# Patient Record
Sex: Female | Born: 1975 | Race: White | Hispanic: No | Smoking: Current every day smoker
Health system: Southern US, Academic
[De-identification: ages and names within clinical notes are randomized; demographics above are authoritative.]

## PROBLEM LIST (undated history)

## (undated) DIAGNOSIS — N289 Disorder of kidney and ureter, unspecified: Secondary | ICD-10-CM

## (undated) DIAGNOSIS — I2510 Atherosclerotic heart disease of native coronary artery without angina pectoris: Secondary | ICD-10-CM

## (undated) DIAGNOSIS — M5410 Radiculopathy, site unspecified: Secondary | ICD-10-CM

## (undated) DIAGNOSIS — F17200 Nicotine dependence, unspecified, uncomplicated: Secondary | ICD-10-CM

## (undated) DIAGNOSIS — R0602 Shortness of breath: Secondary | ICD-10-CM

## (undated) DIAGNOSIS — I499 Cardiac arrhythmia, unspecified: Secondary | ICD-10-CM

## (undated) DIAGNOSIS — F32A Depression, unspecified: Secondary | ICD-10-CM

## (undated) DIAGNOSIS — I8390 Asymptomatic varicose veins of unspecified lower extremity: Secondary | ICD-10-CM

## (undated) DIAGNOSIS — M797 Fibromyalgia: Secondary | ICD-10-CM

## (undated) DIAGNOSIS — F329 Major depressive disorder, single episode, unspecified: Secondary | ICD-10-CM

## (undated) DIAGNOSIS — I639 Cerebral infarction, unspecified: Secondary | ICD-10-CM

## (undated) DIAGNOSIS — R42 Dizziness and giddiness: Secondary | ICD-10-CM

## (undated) DIAGNOSIS — G9050 Complex regional pain syndrome I, unspecified: Secondary | ICD-10-CM

## (undated) DIAGNOSIS — M129 Arthropathy, unspecified: Secondary | ICD-10-CM

## (undated) DIAGNOSIS — L309 Dermatitis, unspecified: Secondary | ICD-10-CM

## (undated) DIAGNOSIS — C801 Malignant (primary) neoplasm, unspecified: Secondary | ICD-10-CM

## (undated) DIAGNOSIS — E079 Disorder of thyroid, unspecified: Secondary | ICD-10-CM

## (undated) DIAGNOSIS — N879 Dysplasia of cervix uteri, unspecified: Secondary | ICD-10-CM

## (undated) DIAGNOSIS — Z8541 Personal history of malignant neoplasm of cervix uteri: Secondary | ICD-10-CM

## (undated) DIAGNOSIS — E785 Hyperlipidemia, unspecified: Secondary | ICD-10-CM

## (undated) DIAGNOSIS — K219 Gastro-esophageal reflux disease without esophagitis: Secondary | ICD-10-CM

## (undated) DIAGNOSIS — F419 Anxiety disorder, unspecified: Secondary | ICD-10-CM

## (undated) DIAGNOSIS — G459 Transient cerebral ischemic attack, unspecified: Secondary | ICD-10-CM

## (undated) DIAGNOSIS — N760 Acute vaginitis: Secondary | ICD-10-CM

## (undated) DIAGNOSIS — J45909 Unspecified asthma, uncomplicated: Secondary | ICD-10-CM

## (undated) DIAGNOSIS — Z87898 Personal history of other specified conditions: Secondary | ICD-10-CM

## (undated) DIAGNOSIS — R221 Localized swelling, mass and lump, neck: Secondary | ICD-10-CM

## (undated) DIAGNOSIS — I10 Essential (primary) hypertension: Secondary | ICD-10-CM

## (undated) DIAGNOSIS — O009 Ectopic pregnancy, unspecified: Secondary | ICD-10-CM

## (undated) DIAGNOSIS — I4891 Unspecified atrial fibrillation: Secondary | ICD-10-CM

## (undated) DIAGNOSIS — J449 Chronic obstructive pulmonary disease, unspecified: Secondary | ICD-10-CM

## (undated) DIAGNOSIS — E282 Polycystic ovarian syndrome: Secondary | ICD-10-CM

## (undated) DIAGNOSIS — F99 Mental disorder, not otherwise specified: Secondary | ICD-10-CM

## (undated) DIAGNOSIS — G959 Disease of spinal cord, unspecified: Secondary | ICD-10-CM

## (undated) DIAGNOSIS — K760 Fatty (change of) liver, not elsewhere classified: Secondary | ICD-10-CM

## (undated) DIAGNOSIS — E041 Nontoxic single thyroid nodule: Secondary | ICD-10-CM

## (undated) DIAGNOSIS — D332 Benign neoplasm of brain, unspecified: Secondary | ICD-10-CM

## (undated) HISTORY — DX: Fatty (change of) liver, not elsewhere classified: K76.0

## (undated) HISTORY — DX: Mental disorder, not otherwise specified: F99

## (undated) HISTORY — DX: Anxiety disorder, unspecified: F41.9

## (undated) HISTORY — DX: Arthropathy, unspecified: M12.9

## (undated) HISTORY — DX: Nontoxic single thyroid nodule: E04.1

## (undated) HISTORY — PX: HX HYSTERECTOMY: SHX81

## (undated) HISTORY — DX: Depression, unspecified: F32.A

## (undated) HISTORY — DX: Asymptomatic varicose veins of unspecified lower extremity: I83.90

## (undated) HISTORY — PX: HX ENDOMETRIAL ABLATION: 2100001129

## (undated) HISTORY — DX: Essential (primary) hypertension: I10

## (undated) HISTORY — PX: HX TONSILLECTOMY: SHX27

## (undated) HISTORY — DX: Acute vaginitis: N76.0

## (undated) HISTORY — DX: Unspecified asthma, uncomplicated: J45.909

## (undated) HISTORY — DX: Fibromyalgia: M79.7

## (undated) HISTORY — DX: Localized swelling, mass and lump, neck: R22.1

## (undated) HISTORY — DX: Cerebral infarction, unspecified: I63.9

## (undated) HISTORY — PX: HX SHOULDER SURGERY: 2100001311

## (undated) HISTORY — DX: Disorder of kidney and ureter, unspecified: N28.9

## (undated) HISTORY — DX: Dysplasia of cervix uteri, unspecified: N87.9

## (undated) HISTORY — DX: Gastro-esophageal reflux disease without esophagitis: K21.9

## (undated) HISTORY — DX: Dizziness and giddiness: R42

## (undated) HISTORY — DX: Unspecified atrial fibrillation: I48.91

## (undated) HISTORY — PX: HX DILATION AND CURETTAGE: SHX78

## (undated) HISTORY — DX: Polycystic ovarian syndrome: E28.2

## (undated) HISTORY — PX: HX TUBAL LIGATION: SHX77

## (undated) HISTORY — DX: Atherosclerotic heart disease of native coronary artery without angina pectoris: I25.10

## (undated) HISTORY — PX: HX HEART CATHETERIZATION: SHX148

## (undated) HISTORY — PX: HX APPENDECTOMY: SHX54

## (undated) NOTE — Progress Notes (Signed)
 Formatting of this note is different from the original.  Subjective   Patient ID: Caitlyn Gomez is a 26 y.o. female presenting to the Urgent Care with a chief complaint of Nasal Congestion (Runny/stuffy nose, sore throat, headache, fatigue X 1 day).    PT C/O SINUS PRESSURE AND CONGESTION.  HAS CHRONIC SINUS ISSUES AND USUALLY REQUIRES AN ABX.    History provided by:  Patient    Objective   BP 110/77 (BP Location: Left arm, Patient Position: Sitting, BP Cuff Size: Large adult)   Pulse 86   Temp 36.4 C (97.5 F) (Temporal)   Resp 20   Ht 1.676 m (5' 6)   Wt (!) 141 kg (310 lb)   SpO2 97%   BMI 50.04 kg/m     Physical Exam  Vitals and nursing note reviewed.   Constitutional:       Appearance: Normal appearance.   HENT:      Right Ear: Tympanic membrane normal.      Left Ear: Tympanic membrane normal.      Nose: Congestion and rhinorrhea present.      Right Sinus: Maxillary sinus tenderness present.      Left Sinus: Maxillary sinus tenderness present.      Mouth/Throat:      Mouth: Mucous membranes are moist.      Pharynx: Posterior oropharyngeal erythema, uvula swelling and postnasal drip present.   Eyes:      Extraocular Movements: Extraocular movements intact.      Pupils: Pupils are equal, round, and reactive to light.   Cardiovascular:      Rate and Rhythm: Normal rate and regular rhythm.      Pulses: Normal pulses.      Heart sounds: Normal heart sounds.   Pulmonary:      Effort: Pulmonary effort is normal.   Skin:     General: Skin is warm and dry.      Capillary Refill: Capillary refill takes less than 2 seconds.   Neurological:      General: No focal deficit present.      Mental Status: She is alert and oriented to person, place, and time.   Psychiatric:         Mood and Affect: Mood normal.         Behavior: Behavior normal.         Assessment & Plan    Assessment & Plan  Cough, unspecified type    Orders:    POCT Covid Antigen    POCT Strep    POCT Influenza    Acute sinusitis, recurrence not  specified, unspecified location            In-House Lab Results:     Results for orders placed or performed in visit on 04/15/24   POCT Strep    Collection Time: 04/15/24  1:32 PM   Result Value Ref Range    Rapid Strep A Screen Negative Negative       In-House Imaging Reads:        Procedure Documentation:  Procedures     ED Course & MDM   MDM - Medical Decision Making: Reviewed previous Chart  Electronically signed by Lauraine Kirsch, CRNP at 04/15/2024  2:21 PM EDT

---
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
## 1997-09-07 ENCOUNTER — Ambulatory Visit (HOSPITAL_COMMUNITY): Payer: Self-pay

## 1998-03-19 ENCOUNTER — Ambulatory Visit (INDEPENDENT_AMBULATORY_CARE_PROVIDER_SITE_OTHER): Payer: Self-pay

## 2001-04-26 ENCOUNTER — Emergency Department (HOSPITAL_COMMUNITY): Payer: Self-pay | Admitting: Emergency Medicine

## 2002-09-14 ENCOUNTER — Ambulatory Visit (HOSPITAL_BASED_OUTPATIENT_CLINIC_OR_DEPARTMENT_OTHER): Payer: Self-pay

## 2004-11-10 ENCOUNTER — Ambulatory Visit (INDEPENDENT_AMBULATORY_CARE_PROVIDER_SITE_OTHER): Payer: Self-pay

## 2005-06-29 ENCOUNTER — Ambulatory Visit (INDEPENDENT_AMBULATORY_CARE_PROVIDER_SITE_OTHER): Payer: Self-pay

## 2009-06-27 ENCOUNTER — Encounter (INDEPENDENT_AMBULATORY_CARE_PROVIDER_SITE_OTHER): Payer: Medicaid Other | Admitting: Gastroenterology

## 2010-01-24 ENCOUNTER — Emergency Department (EMERGENCY_DEPARTMENT_HOSPITAL): Payer: Medicaid Other

## 2010-01-24 ENCOUNTER — Emergency Department
Admission: EM | Admit: 2010-01-24 | Discharge: 2010-01-24 | Disposition: A | Discharge status: Home / Self Care | Payer: Medicaid Other | Attending: Emergency Medicine | Admitting: Emergency Medicine

## 2010-01-24 ENCOUNTER — Encounter (HOSPITAL_COMMUNITY): Payer: Self-pay

## 2010-01-24 DIAGNOSIS — N898 Other specified noninflammatory disorders of vagina: Secondary | ICD-10-CM | POA: Insufficient documentation

## 2010-01-24 LAB — TYPE AND SCREEN
ABO/RH(D): A POS
ANTIBODY SCREEN: NEGATIVE

## 2010-01-24 LAB — NEISSERIA GONORRHOEAE DNA BY PCR

## 2010-01-24 LAB — BASIC METABOLIC PANEL
ANION GAP: 7 mmol/L (ref 5–16)
BUN/CREAT RATIO: 14 (ref 6–22)
BUN: 10 mg/dL (ref 6–20)
CALCIUM: 8.6 mg/dL (ref 8.5–10.4)
CARBON DIOXIDE: 27 mmol/L (ref 22–32)
CHLORIDE: 107 mmol/L (ref 96–111)
CREATININE: 0.69 mg/dL (ref 0.49–1.10)
ESTIMATED GLOMERULAR FILTRATION RATE: >59 ml/min/1.73m2 (ref >59)
GLUCOSE,NONFAST: 88 mg/dL (ref 65–139)
POTASSIUM: 3.3 mmol/L — ABNORMAL LOW (ref 3.5–5.1)
SODIUM: 141 mmol/L (ref 136–145)

## 2010-01-24 LAB — CBC/DIFF
BASOPHILS: 0 % (ref 0–1)
BASOS ABS: 0.051 THOU/uL (ref 0.0–0.2)
EOS ABS: 0.135 THOU/uL (ref 0.1–0.3)
EOSINOPHIL: 1 % (ref 1–6)
HCT: 36.7 % (ref 33.5–45.2)
HGB: 12.2 g/dL (ref 11.5–15.2)
LYMPHOCYTES: 30 % (ref 20–45)
LYMPHS ABS: 3 THOU/uL (ref 1.0–4.8)
MCH: 28.6 pg (ref 27.4–33.0)
MCHC: 33.4 g/dL (ref 31.6–35.5)
MCV: 85.7 fL (ref 82.0–99.0)
MONOCYTES: 8 % (ref 4–13)
MONOS ABS: 0.817 THOU/uL (ref 0.1–0.9)
MPV: 8 FL (ref 7.4–10.4)
NRBC'S: 0 /100{WBCs}
PLATELET COUNT: 252 THOU/uL (ref 140–450)
PMN ABS: 6.17 THOU/uL (ref 1.5–7.7)
PMN'S: 61 % (ref 40–75)
RBC: 4.28 MIL/uL (ref 3.84–5.04)
RDW: 12.5 % (ref 10.2–14.0)
WBC: 10.2 THOU/uL (ref 3.5–11.0)

## 2010-01-24 LAB — WETMOUNT: WETMOUNT: NONE SEEN

## 2010-01-24 LAB — HCG, SERUM QUALITATIVE, PREGNANCY: PREGNANCY, SERUM QUALITATIVE: NEGATIVE

## 2010-01-24 LAB — CHLAMYDIA TRACHOMITIS DNA BY PCR (INHOUSE)

## 2010-01-24 MED ORDER — MORPHINE 4 MG/ML INJECTION SYRINGE
4.0000 mg | INJECTION | Freq: Once | INTRAMUSCULAR | Status: AC
Start: 2010-01-24 — End: 2010-01-24
  Administered 2010-01-24: 4 mg via INTRAVENOUS

## 2010-01-24 MED ORDER — SODIUM CHLORIDE 0.9 % IV BOLUS
1000.0000 mL | INJECTION | Freq: Once | Status: AC
Start: 2010-01-24 — End: 2010-01-24
  Administered 2010-01-24: 0 mL via INTRAVENOUS
  Administered 2010-01-24: 1000 mL via INTRAVENOUS

## 2010-01-24 MED ORDER — HYDROCODONE 5 MG-ACETAMINOPHEN 325 MG TABLET
1.00 | ORAL_TABLET | Freq: Four times a day (QID) | ORAL | Status: DC | PRN
Start: 2010-01-24 — End: 2020-05-03

## 2010-01-24 MED ORDER — MORPHINE 4 MG/ML INJECTION SYRINGE
INJECTION | INTRAMUSCULAR | Status: DC
Start: 2010-01-24 — End: 2010-01-24
  Filled 2010-01-24: qty 1

## 2010-01-24 MED ORDER — NORGESTREL 0.3 MG-ETHINYL ESTRADIOL 30 MCG TABLET
1.00 | ORAL_TABLET | ORAL | Status: DC
Start: 2010-01-24 — End: 2020-05-23

## 2010-01-24 NOTE — ED Resident Handoff Note (Signed)
Care of patient assumed from Dr. Tenny Craw at 7:00 AM with Pelvic US pending prior to disposition.    Renne Crigler, MD 01/24/2010, 6:59 AM    34 y.o. female presents with vaginal bleeding. + abdominal cramping. Preg neg. Hx of endometrial ablation in Feb. Clots on pelvic exam. H/H stable. [place] pending. After [place] will consult GYN for recommendations. She has received morphine for pain.    [place] female pelvis: unremarkable.    Discussed patient with OB/GYN resident on call who recommends starting patient on oral contraceptive with tapering dose, 4 pills day 1, 3 day 2, 2 day 3, and then 1 daily. Discussed plan with patient who is agreeable. Patient notes bleeding has slowed. She states she does not feel lightheaded. Will discharge to home with prescription for Lo-ovral and Lortab for pain. Referral placed to OB/GYn for follow up in 3 days. Advised to return to ER sooner for fevers, dizziness, increased bleeding, or other worsening symptoms. Patient verbalized understanding and is agreeable with plan. Patient states she does not have a ride. Social work aware and will be assisting in arranging transport via bus to [employer].

## 2010-01-24 NOTE — ED Attending Handoff Note (Signed)
H/o endometrial ablation with heavy bleeding.  H/o fibroids.  Pending [place] result.     [place] reviewed.  Case d/w GYN.  Recommend OCP's and to f/u with them.

## 2010-01-24 NOTE — ED Nurses Note (Signed)
Left wrist cleaned with chloraprep, 20 Gauge angio cath inserted positive blood return, labs collected from this site and taken to the lab.

## 2010-01-24 NOTE — ED Attending Note (Signed)
Note begun by:  Raul Del, MD 01/24/2010, 4:30 AM    I was physically present and directly supervised this patient's care.  Resident / Trixie Dredge / NP history and exam reviewed.   Key elements in addition to and/or correction of that documentation are as follows:    HPI :    34 y.o. female presents with chief complaint of VB, intermittent pain for a few days, today had large VB.  Prior endometrial ablation.    PE :   VS on presentation: Blood pressure 110/84, pulse 69, temperature 36.7 C (98 F), resp. rate 18, SpO2 100.00%.    Data/Test :    EKG : None  Images Review by me : None  Image Reports Review by me : As above  Labs : None    Review of Prior Data :       Prior Images : None  Prior EKG : None  Online Medical Records : None  Transfer Docs/Images : None    Clinical Impression :   VB    MDM :   Marked VB in a person with previous endometrial ablation    ED Course :        Plan :       Dispo :       CRITICAL CARE : None

## 2010-01-24 NOTE — ED Nurses Note (Signed)
Report received from [facility].  Will assume care at this time.

## 2010-01-24 NOTE — ED Nurses Note (Signed)
Patient given d/c instructions.  Patient shows verbal understanding.  Patients iv d/c.  Patient resting in room waiting to speak to a social worker about transportation from the hospital to home.

## 2010-01-24 NOTE — ED Provider Notes (Signed)
HPI  Caitlyn Gomez is a 34 y.o. female who presents with pelvic pain. Heavy vaginal bleeding tonight with cramping. S/p uterine ablation and BTL in February with [facility]. Lightheaded. No trauma to her abdomen, pelvis or vagina. No recent intercourse. No fever or chills. No easy bruising. No vaginal bleeding or period since her ablation in Feb 2011.     Review of Systems  No fevers or chills. No cough or shortness of breath. No sorethroat. No ear pain. No vision changes. No headache. No chest pain. No palpitations. No back pain. No joint pain. No nausea, vomiting, dairrhea or abdominal pain. No urinary frequency or dysuria. No seizures, tremors, or loss of consiousness. + pelvic pain, + vaginal bleeding. No vaginal discharge. No substance abuse, suicidal ideation or homicidal ideation. All systems reviewed and otherwise negative.       PRIMARY CARE PHYSICIAN:  Eligha Bridegroom, MD    PAST MEDICAL HISTORY:  DM type 2--diet controlled  depression    MEDICATIONS:  celexa  trazadone  Vistaril    ALLERGIES:  Allergies   Allergen Reactions   . Sulfa (Sulfonamide Antibiotics)    . Amoxicillin          SURGICAL HISTORY:  Uterine ablation  BTL    FAMILY HISTORY:  No family history on file.    SOCIAL HISTORY:  History   Social History   . Marital Status: Legally Separated     Spouse Name: N/A     Number of Children: N/A   . Years of Education: N/A   Occupational History   . Not on file.   Social History Main Topics   . Smoking status: Not on file   . Smokeless tobacco: Not on file   . Alcohol Use: Not on file   . Drug Use: Not on file   . Sexually Active: Not on file   Other Topics Concern   . Not on file   Social History Narrative   . No narrative on file          TRIAGE VITAL SIGNS:  ED Triage Vitals   Enc Vitals Group      BP (Non-Invasive) 01/24/10 0318 110/84 mmHg      Heart Rate 01/24/10 0318 69       Respiratory Rate 01/24/10 0318 18       Temperature 01/24/10 0318 36.7 C (98 F)      Temp src --        SpO2-1 01/24/10 0318 100 %      Weight --       Height --       Head Cir --       Pain Score --       Pain Loc --       Excl. in GC? --         Physical Exam  Physical Exam   Nursing note and vitals reviewed.  Constitutional: Pt is oriented. Pt appears well-developed and well-nourished. Appears uncomfortable  HENT: unremarkble  Head: Atraumatic.   Mouth/Throat: Oropharynx is clear and moist.   Eyes: Conjunctivae and extraocular motions are normal. Pupils are equal, round, and reactive to light.   Neck: Normal range of motion. Neck supple.   Cardiovascular: Normal rate, regular rhythm and intact distal pulses.  Exam reveals no gallop and no friction rub.    No murmur heard.  Pulmonary/Chest: Effort normal and breath sounds normal. No respiratory distress. Pt has no wheezes. Pt has no rales.  Abdominal: Bowel sounds are normal. Pt exhibits no distension. Soft. No tenderness. Pt has no rebound.   Musculoskeletal: Normal range of motion. Pt exhibits no edema and no tenderness.   Neurological: Pt is alert and oriented. No focal deficit appreciated  Skin: Skin is warm. No rash noted. No erythema. No pallor.   Psychiatric: Pt has a normal mood and affect. Behavior is normal. Judgment and thought content normal.   GU: blood in vaginal vault. Active bleeding from cervical os with clots. Uterine ttp. R adnexal ttp.     Course  Have evaluated patient and discussed with Dr. Milinda Cave. Have concern for sources of abnormal uterine bleeding including incomplete ablation or tumor. Pain control provided and appropriate labs and imaging obtained. Care to Dr. Jacqulyn Bath with ultrasounds pending. Anticipate ob/gyn consult after ultrasound with disposition pending their recommendation and ultrasound findings. Pain controlled with morphine at this time.

## 2010-01-24 NOTE — Discharge Instructions (Signed)
Discharge to home. Take lortab for pain. Take oral contraceptives as prescribed to slow bleeding. Follow up with OB.GYN in 1 week. Return to ER sooner for fevers, increased bleeding, dizziness, or other worsening symptoms.

## 2010-01-24 NOTE — ED Nurses Note (Signed)
Patient d/c to waiting room until social worker will be able to speak with her.  Social worker being contacted at this time by Dr. Jacqulyn Bath.

## 2010-02-07 ENCOUNTER — Ambulatory Visit (INDEPENDENT_AMBULATORY_CARE_PROVIDER_SITE_OTHER): Payer: Medicaid Other | Admitting: Obstetrics & Gynecology

## 2010-02-07 ENCOUNTER — Other Ambulatory Visit
Admission: RE | Admit: 2010-02-07 | Discharge: 2010-02-07 | Disposition: A | Discharge status: Home / Self Care | Payer: Medicaid Other | Attending: Hospital-Specialty Hospital | Admitting: Hospital-Specialty Hospital

## 2010-02-07 ENCOUNTER — Encounter (INDEPENDENT_AMBULATORY_CARE_PROVIDER_SITE_OTHER): Payer: Self-pay | Admitting: Obstetrics & Gynecology

## 2010-02-07 MED ORDER — HYDROCODONE 5 MG-ACETAMINOPHEN 325 MG TABLET
1.00 | ORAL_TABLET | ORAL | Status: DC | PRN
Start: 2010-02-07 — End: 2020-05-03

## 2010-02-07 MED ORDER — ESOMEPRAZOLE MAGNESIUM 40 MG CAPSULE,DELAYED RELEASE
40.00 mg | DELAYED_RELEASE_CAPSULE | Freq: Every morning | ORAL | Status: DC
Start: 2010-02-07 — End: 2020-05-23

## 2010-02-07 NOTE — Progress Notes (Signed)
Subjective:     Patient ID:  Caitlyn Gomez is an 34 y.o. female   Chief Complaint:  Chief Complaint   Patient presents with    ED Follow-up     c/o heavy bleeding 5 mos s/p uterine ablation       HPI  Reason for visit:  ED follow-up  HPI:  34 y.o. female [phone] with Patient's last menstrual period was 09/07/2009. presents for ED follow-up.  Was seen in ED for heavy vaginal bleeding.  States had an endometrial ablation and tubal ligation performed in February by Dr. Carley Hammed in [place].  Has spotting and pain since ablation; until she presented earlier this month in ED with heavy vaginal bleeding.  She has continued to have pelvic pain and desires a hysterectomy.  She is supposed to see Dr. Carley Hammed next week.  She does report a history of abnormal PAP smears.  Up until Feb of this year she had heavy menses.  C/o dysuria.    OB History    Grav Para Term Preterm Abortions TAB SAB Ect Mult Living    6 2   4  4   1           Past Medical History   Diagnosis Date    Abnormal Pap smear     Asthma     Diabetes     Dysplasia of cervix     Ectopic pregnancy     Kidney disease     Psychiatric problem     Vaginal infection     Varicosities        Past Surgical History   Procedure Date    Hx tubal ligation     Hx tonsillectomy     Hx dilation and curettage     Hx endometrial ablation        Family History   Problem Relation Age of Onset    Diabetes Maternal Grandmother     Heart Disease Father     Hypertension Father     Cancer Neg Hx     Diabetes Paternal Aunt        History   Social History    Marital Status: Legally Separated     Spouse Name: N/A     Number of Children: N/A    Years of Education: N/A   Occupational History     Education officer, community   Social History Main Topics    Smoking status: Current Everyday Smoker -- 1.5 packs/day for 15 years    Smokeless tobacco: Never Used    Alcohol Use: 0.5 oz/week     1 Glasses of wine per week    Drug Use: Not on file    Sexually Active: Not  Currently -- Female partner(s)     Birth Control/ Protection: Other   Other Topics Concern    Not on file   Social History Narrative    No narrative on file       Allergies:  Sulfa (sulfonamide antibiotics) and Amoxicillin    Current outpatient prescriptions   Medication Sig    hydrocodone-Acetaminophen (NORCO) 5-325 mg Tab tablet take 1 Tab by mouth Every 4 hours as needed for Pain.    esomeprazole magnesium (NEXIUM) 40 mg CpDR take 1 Cap by mouth Daily before Breakfast.    citalopram (CELEXA) 20 mg Tab take 20 mg by mouth Once a day.    trazodone (DESYREL) 50 mg Tab take 50 mg by mouth every night.    hydrOXYzine pamoate (VISTARIL)  50 mg Cap take 50 mg by mouth Three times a day as needed.    Norgestrel-Ethinyl Estradiol 0.3-30 mg-mcg Tab take 1 Tab by mouth. Take 4 pills on day 1  Take 3 pills on day 2  Take 2 pills on day 3  Take 1 pill daily    hydrocodone-Acetaminophen (NORCO) 5-325 mg Tab tablet take 1 Tab by mouth Every 6 hours as needed for Pain.       Review of Systems   Constitutional: Negative.    HENT: Negative.    Eyes: Negative.    Respiratory: Negative.    Cardiovascular: Negative.    Gastrointestinal: Positive for abdominal pain.   Genitourinary: Positive for dysuria.   Musculoskeletal: Negative.    Skin: Negative.    Neurological: Negative.    Endo/Heme/Allergies: Negative.    Psychiatric/Behavioral: Positive for depression.     Objective:   Physical Exam   Constitutional: She is oriented to person, place, and time. She appears well-developed and well-nourished.   HENT:   Head: Normocephalic and atraumatic.   Eyes: Left eye exhibits no discharge.   Abdomen:   Soft. No tenderness. She has no rebound.   GU:    External Exam:    External exam normal.        Vaginal Exam:    Vagina normal to exam.  Cervix is normal to exam.      Bladder:    Bladder is normal to exam.      Uterus:   Uterus is normal to exam.     Position:  Anteflexed.    Size:  8 week weeks gestation.    Contour:  Regular.       Mobility:  Mobile.          Adenexal findings:    No masses.      Neurological: She is alert and oriented to person, place, and time.   Psychiatric: She has a normal mood and affect. Her behavior is normal.     .  Review of [place]:  January 24, 2010   The patient is a 34 year old with history of vaginal bleeding, with right adnexal uterine tenderness to palpation. The patient states that she had endometrial ablation earlier this year.   Transvaginal ultrasound imaging is performed with no previous studies for comparison.   The uterus is normal in size measuring 7.8 cm in length x 3.5 x 4.8 cm. No fibroids are seen. Endometrium is 8.5 mm in thickness. The delineation between the endometrium and myometrium is diminished which is consistent with history of endometrial ablation.   No free fluid is seen.   The right ovary is 2 x 1.4 x 1.7 cm and the left ovary is 2.3 x 1.5 x 2.1 cm. There is a normal follicular pattern bilaterally.   IMPRESSION:   Unremarkable ultrasound examination of the uterus, endometrium and ovaries.   Assessment & Plan:   37 [id number] with pelvic pain, vaginal bleeding and dysuria  [place] was normal on 01/24/2010 with the exception of endometrial lining of 8 mm in a patient s/p endometrial ablation.    Pt desires a hysterectomy after failing endometrial ablation.  Will obtain records from Dr. Owens Shark office.  Record release signed and faxed.    Will see pt back in 2 weeks to set up hysterectomy.  Pt given script for Lortab 5/325 #30 no refills for pain control.  Urine culture sent for dysuria.  Pt to call next week for results of urine culture.  Sheli  Garrett-Albaugh, DO 02/07/2010, 4:23 PM

## 2010-02-08 LAB — URINE CULTURE

## 2010-02-27 ENCOUNTER — Ambulatory Visit (INDEPENDENT_AMBULATORY_CARE_PROVIDER_SITE_OTHER): Payer: Medicaid Other | Admitting: Hospital-Specialty Hospital

## 2010-02-27 ENCOUNTER — Encounter (INDEPENDENT_AMBULATORY_CARE_PROVIDER_SITE_OTHER): Payer: Self-pay | Admitting: Hospital-Specialty Hospital

## 2010-02-27 MED ORDER — ACETAMINOPHEN 300 MG-CODEINE 15 MG TABLET
1.00 | ORAL_TABLET | ORAL | Status: DC | PRN
Start: 2010-02-27 — End: 2020-05-23

## 2010-02-27 MED ORDER — TRAMADOL 50 MG TABLET
50.00 mg | ORAL_TABLET | Freq: Four times a day (QID) | ORAL | Status: DC | PRN
Start: 2010-02-27 — End: 2020-05-03

## 2010-02-27 NOTE — Progress Notes (Signed)
Caitlyn Gomez  [id number]  02/27/2010    CC: f/u visit for vaginal bleeding    34 y.o. female [id number] here for GYN follow up. Pt was last seen by Dr Briant Sites on 02/07/10 for DUB. Pt has a h/o an endometrial ablation and tubal ligation performed in February by Dr. Carley Hammed in [place]. Pt states that her bleeding has stopped since her last visit with [place]; however, the pain and pressure is interfering with her ADL and taking care of her little girl. Pt now desires a hysterectomy.  She does report a history of abnormal PAP smears.Prior to the ablation she reports very heavy menses. No she feels like pressure like she is having a period  But " it can't come out". Pt states that this pain and pressure is making it hard for her to work and causes her to come to tears at home. Pt was also evaluated for dysuria at her last visit. Urine culture was negative. Pt denies any continued sx of dysuria, fevers, or hematuria. LMP was in 08/2009. Pt states that the pain and pressure are constant and only mildly relieved by the lortab.     Pt states that she has lost a significant amount of weight. Pt has been trying to diet  To get into better health.    Review of [place] from January 24, 2010   Impression:   The uterus is normal in size measuring 7.8 cm in length x 3.5 x 4.8 cm. No fibroids are seen. Endometrium is 8.5 mm in thickness. The delineation between the endometrium and myometrium is diminished which is consistent with history of endometrial ablation.   No free fluid is seen.   The right ovary is 2 x 1.4 x 1.7 cm and the left ovary is 2.3 x 1.5 x 2.1 cm. There is a normal follicular pattern bilaterally.   IMPRESSION:   Unremarkable ultrasound examination of the uterus, endometrium and ovaries    On review of the pictures there appears to be a fluid collection within the cervix..     OB History     Grav  Para  Term  Preterm  Abortions  TAB  SAB  Ect  Mult  Living     6  2    4   4    1            Past Medical History     Diagnosis  Date    .  Abnormal Pap smear     .  Asthma     .  Diabetes     .  Dysplasia of cervix     .  Ectopic pregnancy     .  Kidney disease     .  Psychiatric problem     .  Vaginal infection     .  Varicosities       Past Surgical History    Procedure  Date    .  Hx tubal ligation     .  Hx tonsillectomy     .  Hx dilation and curettage     .  Hx endometrial ablation       Family History    Problem  Relation  Age of Onset    .  Diabetes  Maternal Grandmother     .  Heart Disease  Father     .  Hypertension  Father     .  Cancer  Neg Hx     .  Diabetes  Paternal Aunt       History      Social History    .  Marital Status:  Legally Separated      Spouse Name:  N/A      Number of Children:  N/A    .  Years of Education:  N/A      Occupational History    .   [employer]      Social History Main Topics    .  Smoking status:  Current Everyday Smoker -- 1.5 packs/day for 15 years    .  Smokeless tobacco:  Never Used    .  Alcohol Use:  0.5 oz/week      1 Glasses of wine per week    .  Drug Use:  Not on file    .  Sexually Active:  Not Currently -- Female partner(s)      Birth Control/ Protection:  Other      Other Topics  Concern    .  Not on file      Social History Narrative    .  No narrative on file    Allergies:   Sulfa (sulfonamide antibiotics) and Amoxicillin   Current outpatient prescriptions    Medication  Sig    .  hydrocodone-Acetaminophen (NORCO) 5-325 mg Tab tablet  take 1 Tab by mouth Every 4 hours as needed for Pain.    Marland Kitchen  esomeprazole magnesium (NEXIUM) 40 mg CpDR  take 1 Cap by mouth Daily before Breakfast.    .  citalopram (CELEXA) 20 mg Tab  take 20 mg by mouth Once a day.    .  trazodone (DESYREL) 50 mg Tab  take 50 mg by mouth every night.    .  hydrOXYzine pamoate (VISTARIL) 50 mg Cap  take 50 mg by mouth Three times a day as needed.    .  Norgestrel-Ethinyl Estradiol 0.3-30 mg-mcg Tab  take 1 Tab by mouth. Take 4 pills on day 1   Take 3 pills on day 2   Take 2 pills on day 3    Take 1 pill daily    .  hydrocodone-Acetaminophen (NORCO) 5-325 mg Tab tablet  take 1 Tab by mouth Every 6 hours as needed for Pain.      Review of Systems   Constitutional: Negative for fever and chills.    Cardiovascular: Negative for chest pain.   Respiratory: Is not experiencing shortness of breath.   Gastrointestinal: Negative for nausea, vomiting,   Genitourinary: Negative for dysuria, urgency, frequency, hematuria + for pelvic pain and pressure  Psychiatric: positive for depression--pt is dealing with the pain, death of a child and currently going thorough a divorce    Physical Exam   Constitutional: She is oriented. She appears well-developed and obese. No distress.   Head: Normocephalic and atraumatic.   Eyes: Conjunctivae and extraocular motions are normal.   Pulm:   Observations:  no respiratory distress.   GU: Vulva and vagina appear normal. Bimanual exam reveals normal uterus and adnexa.  Cervix: normal appearance and minimal descensus noted with valsalva or pulling with a tenaculum  Adnexa: normal bimanual exam  Uterus: normal single, nontender, anteverted and mobile  Neurological: She is alert and oriented. No cranial nerve deficit.   Skin: She is not diaphoretic.   Psychiatric: She has a normal mood and affect. Her behavior is normal. Judgment and thought content normal.        Assessment & Plan:  33 y.o.[id number] with chronic pelvic pain  [place] was normal on 01/24/2010 with the exception of endometrial lining of 8 mm in a patient s/p endometrial ablation.   Pt desires a hysterectomy after failing endometrial ablation.   Pt given script for Ultram and T#3 no refills for pain control.   Discussed options for hysterectomy. Pt scheduled for Robotic hysterectomy with Dr Cecelia Byars on 04/22/10. Pt to get pre-op appt with Dr Cecelia Byars on 04/14/10.  Discussed ovarian preservation with pt as well.   The patient voiced understanding to the above conversation and all of the patient's questions were answered to her satisfaction.     Bari Mantis, MD 02/27/2010, 1:36 PM

## 2010-02-28 ENCOUNTER — Ambulatory Visit (INDEPENDENT_AMBULATORY_CARE_PROVIDER_SITE_OTHER): Payer: Medicaid Other | Admitting: Obstetrics & Gynecology

## 2010-04-14 ENCOUNTER — Encounter (HOSPITAL_BASED_OUTPATIENT_CLINIC_OR_DEPARTMENT_OTHER): Payer: Medicaid Other | Admitting: Obstetrics & Gynecology

## 2010-04-14 ENCOUNTER — Inpatient Hospital Stay (HOSPITAL_COMMUNITY): Payer: Self-pay

## 2010-04-22 ENCOUNTER — Encounter (HOSPITAL_COMMUNITY): Admission: RE | Payer: Self-pay | Source: Ambulatory Visit

## 2010-04-22 ENCOUNTER — Inpatient Hospital Stay (HOSPITAL_COMMUNITY)
Admission: RE | Admit: 2010-04-22 | Payer: Medicaid Other | Source: Ambulatory Visit | Admitting: Obstetrics & Gynecology

## 2010-04-22 SURGERY — ROBOTIC HYSTERECTOMY
Anesthesia: General

## 2013-11-18 ENCOUNTER — Encounter (INDEPENDENT_AMBULATORY_CARE_PROVIDER_SITE_OTHER): Payer: Self-pay | Admitting: Hospital-Specialty Hospital

## 2019-07-21 DIAGNOSIS — G935 Compression of brain: Secondary | ICD-10-CM

## 2019-07-21 HISTORY — DX: Compression of brain: G93.5

## 2020-04-01 ENCOUNTER — Ambulatory Visit: Payer: Medicaid Other | Attending: Neurological Surgery | Admitting: Neurological Surgery

## 2020-04-01 ENCOUNTER — Other Ambulatory Visit: Payer: Self-pay

## 2020-04-01 ENCOUNTER — Encounter (INDEPENDENT_AMBULATORY_CARE_PROVIDER_SITE_OTHER): Payer: Self-pay | Admitting: Neurological Surgery

## 2020-04-01 VITALS — BP 131/73 | HR 71 | Temp 95.5°F | Ht 67.21 in | Wt 324.3 lb

## 2020-04-01 DIAGNOSIS — R221 Localized swelling, mass and lump, neck: Secondary | ICD-10-CM | POA: Insufficient documentation

## 2020-04-01 DIAGNOSIS — N911 Secondary amenorrhea: Secondary | ICD-10-CM | POA: Insufficient documentation

## 2020-04-01 DIAGNOSIS — H538 Other visual disturbances: Secondary | ICD-10-CM | POA: Insufficient documentation

## 2020-04-01 DIAGNOSIS — Z6841 Body Mass Index (BMI) 40.0 and over, adult: Secondary | ICD-10-CM

## 2020-04-01 DIAGNOSIS — O926 Galactorrhea: Secondary | ICD-10-CM | POA: Insufficient documentation

## 2020-04-01 DIAGNOSIS — O9089 Other complications of the puerperium, not elsewhere classified: Secondary | ICD-10-CM | POA: Insufficient documentation

## 2020-04-01 DIAGNOSIS — R519 Headache, unspecified: Secondary | ICD-10-CM | POA: Insufficient documentation

## 2020-04-01 NOTE — Progress Notes (Signed)
NEUROSURGERY, PHYSICIAN OFFICE CENTER  [place] [id number]  Operated by [facility]  History and Physical     Name: Caitlyn Gomez MRN:  [id number]   Date: 04/01/2020 Age: 44 y.o.       Referring Provider:  Pcp, No  No address on file       Gender: female  Handedness: Left handed  Marital Status: Divorced   Job Title (or Former Job): MGR for [employer]      Chief Complaint:   Chief Complaint   Patient presents with   . Establish Care     Mass on cerebellum w/MRi co 01/30/20     History is provided by patient, relative (sister)    History of Present Illness  This is a 44 yo female with a cerebellar lesion diagnosed on workup for headaches in the occipital region (sharp to dull ) for the past year. Pt with hx of left-sided neck mass and thyroid nodules without recent workup.  The patient complains of dizziness/lightheadedness, loc and blurred vision.  She has blurred vision with "wavy lines" in the corner of her vision.  She complains of spells that involve spasms and numbness in her hands and feet that last 5-10 minutes over the past year.  She also has choking/spasms/hiccups several times a day for the past 2 yrs (just started on thorazine without benefit - 5 weeks) - endoscopy.  Aspirated during a colonoscopy so this was not completed.    She has a thyroid lesion (that is being observed). She has urinary incontinence for the past 2 yrs - stress.  She had a hx of cervical cancer with resultant hysterectomy  Neurologist - Dr Phoebe Perch Rana - fibromyalgia.  Past History  Current Outpatient Medications   Medication Sig   . Acetaminophen-Codeine (TYLENOL #2) 300-15 mg Tab take 1 Tab by mouth Every 4 hours as needed for Pain.   Marland Kitchen apixaban (ELIQUIS) 5 mg Oral Tablet Take 5 mg by mouth Once a day   . citalopram (CELEXA) 20 mg Tab take 20 mg by mouth Once a day.   . esomeprazole magnesium (NEXIUM) 40 mg CpDR take 1 Cap by mouth Daily before Breakfast.   . hydrocodone-Acetaminophen (NORCO)  5-325 mg Tab tablet take 1 Tab by mouth Every 6 hours as needed for Pain.   . hydrocodone-Acetaminophen (NORCO) 5-325 mg Tab tablet take 1 Tab by mouth Every 4 hours as needed for Pain.   . hydrOXYzine pamoate (VISTARIL) 50 mg Cap take 50 mg by mouth Three times a day as needed.   . Norgestrel-Ethinyl Estradiol 0.3-30 mg-mcg Tab take 1 Tab by mouth. Take 4 pills on day 1  Take 3 pills on day 2  Take 2 pills on day 3  Take 1 pill daily   . tramadol (ULTRAM) 50 mg Tab take 1 Tab by mouth Every 6 hours as needed.   . trazodone (DESYREL) 50 mg Tab take 50 mg by mouth every night.     Allergies   Allergen Reactions   . Amoxicillin    . Sulfa (Sulfonamides)      Past Medical History:   Diagnosis Date   . A-fib (CMS HCC)     Dr. Chauncey Cruel. Lucio Edward   . Abnormal Pap smear    . Anxiety    . Asthma    . Coronary artery disease    . Depression    . Diabetes    . Dysplasia of cervix    .  Ectopic pregnancy    . Fibromyalgia    . GERD (gastroesophageal reflux disease)    . HTN (hypertension)    . Kidney disease    . Neck mass    . PCOS (polycystic ovarian syndrome)    . Psychiatric problem    . Stroke (CMS [facility])     TIA- [place] - 2016 - (left facial droop and left arm weakness)   . Thyroid nodule    . Vaginal infection    . Varicosities          Past Surgical History:   Procedure Laterality Date   . HX APPENDECTOMY     . HX DILATION AND CURETTAGE     . HX ENDOMETRIAL ABLATION     . HX HYSTERECTOMY     . HX TONSILLECTOMY     . HX TUBAL LIGATION           Family History  Family Medical History:     Problem Relation (Age of Onset)    Diabetes Maternal Grandmother, Paternal 54    Healthy Mother    Heart Disease Father    Hypertension (High Blood Pressure) Father            Social History  Social History     Socioeconomic History   . Marital status: Legally Separated     Spouse name: Not on file   . Number of children: 2   . Years of education: Not on file   . Highest education level: Not on file   Occupational History     Employer: [employer]   Tobacco Use   . Smoking status: Current Every Day Smoker     Packs/day: 1.00     Years: 15.00     Pack years: 15.00   . Smokeless tobacco: Never Used   Substance and Sexual Activity   . Alcohol use: Yes     Alcohol/week: 0.8 standard drinks     Types: 1 Glasses of wine per week     Comment: couple times a month   . Sexual activity: Not Currently     Partners: Male     Birth control/protection: Other   Other Topics Concern   Social History Narrative    1 child deceased, 1 living     Social Determinants of Health     Financial Resource Strain:    . Difficulty of Paying Living Expenses:    Food Insecurity:    . Worried About Charity fundraiser in the Last Year:    . Arboriculturist in the Last Year:    Transportation Needs:    . Film/video editor (Medical):    Marland Kitchen Lack of Transportation (Non-Medical):    Physical Activity:    . Days of Exercise per Week:    . Minutes of Exercise per Session:    Stress:    . Feeling of Stress :    Intimate Partner Violence:    . Fear of Current or Ex-Partner:    . Emotionally Abused:    Marland Kitchen Physically Abused:    . Sexually Abused:      Review of Systems  Other than ROS in the HPI, all other systems were negative.    Examination  BP 131/73   Pulse 71   Temp 35.3 C (95.5 F) (Thermal Scan)   Ht 1.707 m (5' 7.21")   Wt (!) 147 kg (324 lb 4.8 oz)   LMP 09/07/2009   SpO2 99%  BMI 50.48 kg/m     Constitutional  General appearance: Normal  HNNT: Normal  Eyes: Ophthalmic exam of optic discs and posterior segments: Normal  Cardiovascular:   Carotid arteries: Normal  Auscultation: Normal  Peripheral vascular system: Normal  Musculoskeletal  Gait and Station: : Abnormal: antalgic favors the left leg  Muscle strength (upper extremities): : Normal  Muscle strength (lower extremities): : Normal  Muscle tone (upper extremities): : Normal  Muscle tone (lower extremities): : Normal  Sensation: Normal  Deep tendon reflexes upper and lower extremities: Normal  Coordination:  Normal  Hoffman's reflex: Left: negative Right: negative  Ankle clonus:Left : Not present Right Not present  Babinski: Left: absent Right:absent    Neurological  Orientation: Normal  Recent and remote memory: Normal  Attention span and concentration: Normal  Language: Normal  Fund of knowledge: Normal  Cranial Nerves  2nd: Normal  3rd,4th,6th: Normal  5th: Normal  7th: Normal  8th: Normal  9th: Normal  11th: Normal  12th: Normal      Data reviewed  1. MRI of the Brain performed on 01/30/20 on [place] via [place] and it shows small left cavernoma.     Discussions with other providers:     Diagnosis  1. Chiari Frommel syndrome    2. Headache    3. Blurred vision    4. Neck mass        Recommendations  Orders Placed This Encounter   . MRI BRAIN CINE FLOW W/WO CONTRAST   . MRI CERVICAL W CINE FLOW W/O AND W CONTRAST   . Refer to [facility] ENT [facility]   . Refer to [facility]     Will arrange CINE MRI brain/cervical to evaluate for csf flow restriction given her chiari. Will also arrange an neuroophtho eval for possible pseudotumor cerebrii.  The patient was seen as a shared visit with the co-signing faculty.  Barrie Folk, PA-C    I personally saw and evaluated the patient. See mid-level's note for additional details. 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 Workup as above.    Dagmar Hait, MD

## 2020-04-12 ENCOUNTER — Ambulatory Visit: Payer: Medicaid Other | Attending: Otolaryngology | Admitting: Otolaryngology

## 2020-04-12 ENCOUNTER — Encounter (INDEPENDENT_AMBULATORY_CARE_PROVIDER_SITE_OTHER): Payer: Self-pay | Admitting: Otolaryngology

## 2020-04-12 ENCOUNTER — Other Ambulatory Visit: Payer: Self-pay

## 2020-04-12 VITALS — BP 132/80 | HR 86 | Temp 97.1°F | Ht 66.0 in | Wt 320.5 lb

## 2020-04-12 DIAGNOSIS — R221 Localized swelling, mass and lump, neck: Secondary | ICD-10-CM

## 2020-04-12 DIAGNOSIS — O9089 Other complications of the puerperium, not elsewhere classified: Secondary | ICD-10-CM | POA: Insufficient documentation

## 2020-04-12 DIAGNOSIS — O926 Galactorrhea: Secondary | ICD-10-CM | POA: Insufficient documentation

## 2020-04-12 DIAGNOSIS — R519 Headache, unspecified: Secondary | ICD-10-CM | POA: Insufficient documentation

## 2020-04-12 DIAGNOSIS — H538 Other visual disturbances: Secondary | ICD-10-CM | POA: Insufficient documentation

## 2020-04-12 DIAGNOSIS — Z6841 Body Mass Index (BMI) 40.0 and over, adult: Secondary | ICD-10-CM

## 2020-04-12 DIAGNOSIS — R1310 Dysphagia, unspecified: Secondary | ICD-10-CM | POA: Insufficient documentation

## 2020-04-12 DIAGNOSIS — N911 Secondary amenorrhea: Secondary | ICD-10-CM | POA: Insufficient documentation

## 2020-04-12 DIAGNOSIS — E041 Nontoxic single thyroid nodule: Secondary | ICD-10-CM | POA: Insufficient documentation

## 2020-04-12 MED ORDER — OMEPRAZOLE 40 MG CAPSULE,DELAYED RELEASE
40.0000 mg | DELAYED_RELEASE_CAPSULE | Freq: Every day | ORAL | 4 refills | Status: DC
Start: 2020-04-12 — End: 2020-04-12

## 2020-04-12 MED ORDER — OMEPRAZOLE 40 MG CAPSULE,DELAYED RELEASE
40.00 mg | DELAYED_RELEASE_CAPSULE | Freq: Two times a day (BID) | ORAL | 4 refills | Status: AC
Start: 2020-04-12 — End: ?

## 2020-04-12 MED ORDER — OMEPRAZOLE 40 MG CAPSULE,DELAYED RELEASE
40.00 mg | DELAYED_RELEASE_CAPSULE | Freq: Two times a day (BID) | ORAL | 4 refills | Status: DC
Start: 2020-04-12 — End: 2020-04-12

## 2020-04-12 MED ORDER — CITALOPRAM 20 MG TABLET
20.0000 mg | ORAL_TABLET | Freq: Every day | ORAL | 1 refills | Status: DC
Start: 2020-04-12 — End: 2022-11-04

## 2020-04-12 NOTE — Progress Notes (Signed)
[facility] OF OTOLARYNGOLOGY-HEAD AND NECK SURGERY  HISTORY AND PHYSICAL    Patient Name: Caitlyn Gomez  Date of Service: 04/12/2020  Primary Care Physician:  Oley Balm, MD  [address]  [place] [address]  Referring Physician:  Barrie Folk, PA-C  [place]  [place],  [place] [id number]    History of Present Illness:  Caitlyn Gomez is a 44 y.o. 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 Patient reports having a left sided fluctuating neck mass for about 1.5-2 years duration. Nothing in particular causes this area to increase or decrease in size. It is tender and painful. She has been prescribed 2 different muscle relaxers in the past for this that have not helped. She underwent an US of the area that was unremarkable but did note incidental thyroid nodules that were less than 1cm in size. Patient also reporting dysphagia with trouble swallowing some foods and needing to take liquids to help her swallow. Also endorses a change in the quality of her voice. She has not undergone an MBSS but has seen an ENT who has performed flexible laryngoscopy and reported laryngeal changes associated with acid reflux and possibly muscle tension. She endorses feeling a great deal of anxiety and depression associated with all of these recent diagnosis and lab tests    Review of Systems  A comprehensive review of systems was otherwise negative.    Current Outpatient Medications   Medication Sig    Acetaminophen-Codeine (TYLENOL #2) 300-15 mg Tab take 1 Tab by mouth Every 4 hours as needed for Pain.    apixaban (ELIQUIS) 5 mg Oral Tablet Take 5 mg by mouth Once a day    citalopram (CELEXA) 20 mg Oral Tablet Take 1 Tablet (20 mg total) by mouth Once a day    esomeprazole magnesium (NEXIUM) 40 mg CpDR  take 1 Cap by mouth Daily before Breakfast.    hydrocodone-Acetaminophen (NORCO) 5-325 mg Tab tablet take 1 Tab by mouth Every 6 hours as needed for Pain.    hydrocodone-Acetaminophen (NORCO) 5-325 mg Tab tablet take 1 Tab by mouth Every 4 hours as needed for Pain.    hydrOXYzine pamoate (VISTARIL) 50 mg Cap take 50 mg by mouth Three times a day as needed.    Norgestrel-Ethinyl Estradiol 0.3-30 mg-mcg Tab take 1 Tab by mouth. Take 4 pills on day 1  Take 3 pills on day 2  Take 2 pills on day 3  Take 1 pill daily    omeprazole (PRILOSEC) 40 mg Oral Capsule, Delayed Release(E.C.) Take 1 Capsule (40 mg total) by mouth Twice daily    tramadol (ULTRAM) 50 mg Tab take 1 Tab by mouth Every 6 hours as needed.    trazodone (DESYREL) 50 mg Tab take 50 mg by mouth every night.     Allergies   Allergen Reactions    Amoxicillin     Sulfa (Sulfonamides)      Past Medical History:   Diagnosis Date    A-fib (CMS HCC)     Dr. Chauncey Cruel. Rana    Abnormal Pap smear     Anxiety     Asthma     Coronary artery disease     Depression     Diabetes     Dysplasia of cervix  Ectopic pregnancy     Fibromyalgia     GERD (gastroesophageal reflux disease)     HTN (hypertension)     Kidney disease     Neck mass     PCOS (polycystic ovarian syndrome)     Psychiatric problem     Stroke (CMS [facility])     TIA- [place] - 2016 - (left facial droop and left arm weakness)    Thyroid nodule     Vaginal infection     Varicosities          Past Surgical History:   Procedure Laterality Date    HX APPENDECTOMY      HX DILATION AND CURETTAGE      HX ENDOMETRIAL ABLATION      HX HYSTERECTOMY      HX TONSILLECTOMY      HX TUBAL LIGATION           Social History     Socioeconomic History    Marital status: Legally Separated     Spouse name: Not on file    Number of children: 2    Years of education: Not on file    Highest education level: Not on file   Occupational History     Employer: [employer]   Tobacco Use    Smoking  status: Current Every Day Smoker     Packs/day: 1.00     Years: 15.00     Pack years: 15.00    Smokeless tobacco: Never Used   Brewing technologist Use: Never used   Substance and Sexual Activity    Alcohol use: Yes     Alcohol/week: 0.8 standard drinks     Types: 1 Glasses of wine per week     Comment: couple times a month    Drug use: Never    Sexual activity: Not Currently     Partners: Male     Birth control/protection: Other   Other Topics Concern   Social History Narrative    1 child deceased, 1 living     Social Determinants of Health     Financial Resource Strain:     Difficulty of Paying Living Expenses:    Food Insecurity:     Worried About Charity fundraiser in the Last Year:     Arboriculturist in the Last Year:    Transportation Needs:     Film/video editor (Medical):     Lack of Transportation (Non-Medical):    Physical Activity:     Days of Exercise per Week:     Minutes of Exercise per Session:    Stress:     Feeling of Stress :    Intimate Partner Violence:     Fear of Current or Ex-Partner:     Emotionally Abused:     Physically Abused:     Sexually Abused:      Family Medical History:     Problem Relation (Age of Onset)    Diabetes Maternal Grandmother, Paternal Aunt    Healthy Mother    Heart Disease Father    Hypertension (High Blood Pressure) Father              Physical Examination  BP 132/80    Pulse 86    Temp 36.2 C (97.1 F) (Thermal Scan)    Ht 1.676 m (5\' 6" )    Wt (!) 145 kg (320 lb 8.8 oz)    LMP 09/07/2009  BMI 51.74 kg/m      Body mass index is 51.74 kg/m.   General Appearance: pleasant, cooperative, no distress  Psychiatric: AOx3  Neurologic: grossly normal CN II-XII exam. Specifically, CN XII intact, no evidence of tongue fasiculations. Symmetric palat elevation. Symmetric arm raising.  Eyes: Conjunctiva clear., Pupils equal and round.   Head and Face: Facies symmetric, no obvious lesions.  Left ear: External Auditory Canals: Patent without inflammation.  Tympanic Membrane intact, translucent, midposition, middle ear aerated  Right ear: External Auditory Canals: Patent without inflammation. Tympanic Membrane intact, translucent, midposition, middle ear aerated  Nose: external pyramid midline, septum midline,  mucosa normal,  no purulence,  polyps, or crusts   Oral Cavity/Oropharynx: No mucosal lesions, masses, or pharyngeal asymmetry.  Hypopharynx/Larynx: Voice mildly hoarse  Salivary glands: non-tender to palpation  Thyroid: no significant thyroid abnormality by palpation.  Neck:: no cervical adenopathy, no palpable thyroid or salivary gland masses and lower left neck in the region of the trapezium muscle was tender to palpation. Muscles feeling hypertonic  Extremities: no cyanosis or edema  Skin: Skin warm and dry    Data reviewed:  I have reviewed the following results: Reviewed the note from Dr. Bobby Rumpf on 04/01/2020.     Diagnosis and Plan    ICD-10-CM    1. Thyroid nodule  E04.1 US THYROID   2. Neck mass  R22.1 Refer to [facility]Phys[facility]     US THYROID   3. Chiari Frommel syndrome  O90.89 Refer to [facility] ENT [place]    N91.1 FLUORO ESOPHAGRAM, MODIFIED SWALLOW    O92.6 [place] (OUTPT ONLY)   4. Headache  R51.9 Refer to [facility] ENT [facility]   5. Blurred vision  H53.8 Refer to [facility] ENT [facility]   6. Dysphagia, unspecified type  R13.10 FLUORO ESOPHAGRAM, MODIFIED SWALLOW     [place] SPEECH/SWALLOW EVAL & TREAT (OUTPT ONLY)   Caitlyn Gomez is a 44 y/o F with new neurologic symptoms beginning about 1.5-2 years ago currently being worked by Dr. Bobby Rumpf and the neurosurgery team. She is here on referral for neck spasms/swelling, incidental finding of thyroid nodules, dysphagia, and voice changes. We feel that her neck symptoms may be related to her neurologic issues at present. We will do continued monitoring of her thyroid nodules with repeat US in 2 years as they are  small and have nonconcerning features. We ordered a swallow study and increased her dose of omeprazole to 40 mg BID to help with her GERD symptoms that we believe are contributing to her hoarsness and dysphagia. Finally, we will start a low dose citalopram for her anxiety/depression until she is able to get an appointment with her PCP for further management of anxiety /depression.  She has reason to be going through some adjustment disorder/depressive episode (which she has suffered from in the past with loss of a child/illness).    Orders Placed This Encounter    US THYROID    FLUORO ESOPHAGRAM, MODIFIED SWALLOW    citalopram (CELEXA) 20 mg Oral Tablet    omeprazole (PRILOSEC) 40 mg Oral Capsule, Delayed Release(E.C.)     Evalee Jefferson, DO  04/12/2020, 11:07     I personally performed the history and physical examination on the day of this visit.  I also personally reviewed the laboratory results and imaging with the resident on the day of this visit.  I agree with the above findings and plan as documented.  Darril Patriarca T. Radford Pax, MD  Skull Base Surgery/Head and Neck Surgery  Department of Otolaryngology-Head and Neck Surgery  [facility]        No notes on file

## 2020-04-25 ENCOUNTER — Ambulatory Visit (INDEPENDENT_AMBULATORY_CARE_PROVIDER_SITE_OTHER)
Admission: RE | Admit: 2020-04-25 | Discharge: 2020-04-25 | Disposition: A | Discharge status: Home / Self Care | Payer: Medicaid Other | Source: Ambulatory Visit

## 2020-04-25 ENCOUNTER — Ambulatory Visit (HOSPITAL_BASED_OUTPATIENT_CLINIC_OR_DEPARTMENT_OTHER)
Admission: RE | Admit: 2020-04-25 | Discharge: 2020-04-25 | Disposition: A | Discharge status: Home / Self Care | Payer: Medicaid Other | Source: Ambulatory Visit

## 2020-04-25 ENCOUNTER — Other Ambulatory Visit: Payer: Self-pay

## 2020-04-25 ENCOUNTER — Ambulatory Visit: Payer: Medicaid Other | Attending: Otolaryngology | Admitting: Otolaryngology

## 2020-04-25 ENCOUNTER — Encounter (HOSPITAL_BASED_OUTPATIENT_CLINIC_OR_DEPARTMENT_OTHER): Payer: Self-pay | Admitting: Otolaryngology

## 2020-04-25 VITALS — BP 131/102 | HR 76 | Temp 98.1°F | Resp 18 | Ht 66.0 in | Wt 317.5 lb

## 2020-04-25 DIAGNOSIS — R221 Localized swelling, mass and lump, neck: Secondary | ICD-10-CM

## 2020-04-25 DIAGNOSIS — O9089 Other complications of the puerperium, not elsewhere classified: Secondary | ICD-10-CM

## 2020-04-25 DIAGNOSIS — O926 Galactorrhea: Secondary | ICD-10-CM

## 2020-04-25 DIAGNOSIS — R519 Headache, unspecified: Secondary | ICD-10-CM

## 2020-04-25 DIAGNOSIS — Z7901 Long term (current) use of anticoagulants: Secondary | ICD-10-CM | POA: Insufficient documentation

## 2020-04-25 DIAGNOSIS — Z79891 Long term (current) use of opiate analgesic: Secondary | ICD-10-CM | POA: Insufficient documentation

## 2020-04-25 DIAGNOSIS — F17210 Nicotine dependence, cigarettes, uncomplicated: Secondary | ICD-10-CM | POA: Insufficient documentation

## 2020-04-25 DIAGNOSIS — F329 Major depressive disorder, single episode, unspecified: Secondary | ICD-10-CM | POA: Insufficient documentation

## 2020-04-25 DIAGNOSIS — N911 Secondary amenorrhea: Secondary | ICD-10-CM | POA: Insufficient documentation

## 2020-04-25 DIAGNOSIS — R066 Hiccough: Secondary | ICD-10-CM | POA: Insufficient documentation

## 2020-04-25 DIAGNOSIS — K219 Gastro-esophageal reflux disease without esophagitis: Secondary | ICD-10-CM | POA: Insufficient documentation

## 2020-04-25 DIAGNOSIS — E042 Nontoxic multinodular goiter: Secondary | ICD-10-CM | POA: Insufficient documentation

## 2020-04-25 DIAGNOSIS — G935 Compression of brain: Secondary | ICD-10-CM

## 2020-04-25 DIAGNOSIS — H538 Other visual disturbances: Secondary | ICD-10-CM

## 2020-04-25 DIAGNOSIS — Z8673 Personal history of transient ischemic attack (TIA), and cerebral infarction without residual deficits: Secondary | ICD-10-CM | POA: Insufficient documentation

## 2020-04-25 DIAGNOSIS — E041 Nontoxic single thyroid nodule: Secondary | ICD-10-CM

## 2020-04-25 DIAGNOSIS — Q0700 Arnold-Chiari syndrome without spina bifida or hydrocephalus: Secondary | ICD-10-CM | POA: Insufficient documentation

## 2020-04-25 DIAGNOSIS — Z79899 Other long term (current) drug therapy: Secondary | ICD-10-CM | POA: Insufficient documentation

## 2020-04-25 DIAGNOSIS — Z6841 Body Mass Index (BMI) 40.0 and over, adult: Secondary | ICD-10-CM

## 2020-04-25 LAB — POC ISTAT CREATININE (RESULT): CREATININE, POC: 0.8 mg/dL (ref 0.49–1.10)

## 2020-04-25 MED ORDER — GADOBUTROL 1 MMOL/ML (604.72 MG/ML) INTRAVENOUS SOLUTION
14.0000 mL | INTRAVENOUS | Status: AC
Start: 2020-04-25 — End: 2020-04-25
  Administered 2020-04-25: 19:00:00 14 mL via INTRAVENOUS

## 2020-04-25 NOTE — Progress Notes (Unsigned)
[facility] AND NECK SURGERY  HISTORY AND PHYSICAL    Patient Name: Caitlyn Gomez  Date of Service: 04/25/2020  Primary Care Physician:  Oley Balm, MD  [address]  [place] [address]  Referring Physician:  No referring provider defined for this encounter.    History of Present Illness:  Caitlyn Gomez is a 44 y.o. 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 Patient reports having a left sided fluctuating neck mass for about 1.5-2 years duration. Nothing in particular causes this area to increase or decrease in size. It is tender and painful. She has been prescribed 2 different muscle relaxers in the past for this that have not helped. She underwent an US of the area that was unremarkable but did note incidental thyroid nodules that were less than 1cm in size. Patient also reporting dysphagia with trouble swallowing some foods and needing to take liquids to help her swallow. Also endorses a change in the quality of her voice.  She has intermittent gasps (which she calls hiccoughs) but these are likely agonal type breathing due to her syndrome.    She has seen an ENT who has performed flexible laryngoscopy and reported laryngeal changes associated with acid reflux and possibly muscle tension. She endorses feeling a great deal of anxiety and depression associated with all of these recent diagnosis and lab tests.  At the last visit we put her on baseline PPI, which she is not taking.  We ordered an MBSS and this has not been scheduled.    Review of Systems  She has a pressure in her head.  She also has increased pressure when coughing (not true tussive headache).  She has not seen neuropthalmology for field testing and is scheduled).  A comprehensive review of systems was otherwise  negative.    Current Outpatient Medications   Medication Sig   . Acetaminophen-Codeine (TYLENOL #2) 300-15 mg Tab take 1 Tab by mouth Every 4 hours as needed for Pain.   Marland Kitchen apixaban (ELIQUIS) 5 mg Oral Tablet Take 5 mg by mouth Once a day   . atorvastatin (LIPITOR) 40 mg Oral Tablet Take 40 mg by mouth Every evening   . bethanechol chloride (URECHOLINE) 5 mg Oral Tablet Take 1 Tablet by mouth Every night   . chlorproMAZINE (THORAZINE) 25 mg Oral Tablet Take 1 Tablet by mouth Twice daily   . citalopram (CELEXA) 20 mg Oral Tablet Take 1 Tablet (20 mg total) by mouth Once a day   . esomeprazole magnesium (NEXIUM) 40 mg CpDR take 1 Cap by mouth Daily before Breakfast.   . famotidine (PEPCID) 40 mg Oral Tablet Take 1 Tablet by mouth Twice daily   . hydrocodone-Acetaminophen (NORCO) 5-325 mg Tab tablet take 1 Tab by mouth Every 6 hours as needed for Pain.   . hydrocodone-Acetaminophen (NORCO) 5-325 mg Tab tablet take 1 Tab by mouth Every 4 hours as needed for Pain.   . hydrOXYzine pamoate (VISTARIL) 50 mg Cap take 50 mg by mouth Three times a day as needed.   . isosorbide mononitrate (IMDUR) 30 mg Oral Tablet Sustained Release 24 hr Take 30 mg by mouth Every morning   . metoprolol succinate (TOPROL-XL) 50 mg Oral Tablet Sustained Release 24 hr Take 1 Tablet by mouth Once a day   . nitroGLYCERIN (  NITROSTAT) 0.3 mg Sublingual Tablet, Sublingual 0.3 mg by Sublingual route Every 5 minutes as needed for Chest pain for 3 doses over 15 minutes   . Norgestrel-Ethinyl Estradiol 0.3-30 mg-mcg Tab take 1 Tab by mouth. Take 4 pills on day 1  Take 3 pills on day 2  Take 2 pills on day 3  Take 1 pill daily   . omeprazole (PRILOSEC) 40 mg Oral Capsule, Delayed Release(E.C.) Take 1 Capsule (40 mg total) by mouth Twice daily   . PROAIR HFA 90 mcg/actuation Inhalation HFA Aerosol Inhaler Take 1 Puff by inhalation Every 4 hours as needed   . tramadol (ULTRAM) 50 mg Tab take 1 Tab by mouth Every 6 hours as needed.   . trazodone (DESYREL) 50  mg Tab take 50 mg by mouth every night.     Allergies   Allergen Reactions   . Amoxicillin    . Sulfa (Sulfonamides)      Past Medical History:   Diagnosis Date   . A-fib (CMS HCC)     Dr. Chauncey Cruel. Lucio Edward   . Abnormal Pap smear    . Anxiety    . Asthma    . Coronary artery disease    . Depression    . Diabetes    . Dysplasia of cervix    . Ectopic pregnancy    . Fibromyalgia    . GERD (gastroesophageal reflux disease)    . HTN (hypertension)    . Kidney disease    . Neck mass    . PCOS (polycystic ovarian syndrome)    . Psychiatric problem    . Stroke (CMS [facility])     TIA- [place] - 2016 - (left facial droop and left arm weakness)   . Thyroid nodule    . Vaginal infection    . Varicosities          Past Surgical History:   Procedure Laterality Date   . HX APPENDECTOMY     . HX DILATION AND CURETTAGE     . HX ENDOMETRIAL ABLATION     . HX HYSTERECTOMY     . HX TONSILLECTOMY     . HX TUBAL LIGATION           Social History     Socioeconomic History   . Marital status: Legally Separated     Spouse name: Not on file   . Number of children: 2   . Years of education: Not on file   . Highest education level: Not on file   Occupational History     Employer: [employer]   Tobacco Use   . Smoking status: Current Every Day Smoker     Packs/day: 1.00     Years: 15.00     Pack years: 15.00   . Smokeless tobacco: Never Used   Vaping Use   . Vaping Use: Never used   Substance and Sexual Activity   . Alcohol use: Yes     Alcohol/week: 0.8 standard drinks     Types: 1 Glasses of wine per week     Comment: couple times a month   . Drug use: Never   . Sexual activity: Not Currently     Partners: Male     Birth control/protection: Other   Other Topics Concern   Social History Narrative    1 child deceased, 1 living     Social Determinants of Health     Financial Resource Strain:    . Difficulty of Paying  Living Expenses:    Food Insecurity:    . Worried About Charity fundraiser in the Last Year:    . Arboriculturist in the Last Year:     Transportation Needs:    . Film/video editor (Medical):    Marland Kitchen Lack of Transportation (Non-Medical):    Physical Activity:    . Days of Exercise per Week:    . Minutes of Exercise per Session:    Stress:    . Feeling of Stress :    Intimate Partner Violence:    . Fear of Current or Ex-Partner:    . Emotionally Abused:    Marland Kitchen Physically Abused:    . Sexually Abused:      Family Medical History:     Problem Relation (Age of Onset)    Diabetes Maternal Grandmother, Paternal Aunt    Healthy Mother    Heart Disease Father    Hypertension (High Blood Pressure) Father              Physical Examination  BP (!) 131/102   Pulse 76   Temp 36.7 C (98.1 F) (Thermal Scan)   Resp 18   Ht 1.676 m (5\' 6" )   Wt (!) 144 kg (317 lb 8 oz)   LMP 09/07/2009   SpO2 99%   BMI 51.25 kg/m      Body mass index is 51.25 kg/m.   General Appearance: pleasant, cooperative, no distress  Psychiatric: AOx3  Neurologic: grossly normal CN II-XII exam. Specifically, CN XII intact, no evidence of tongue fasiculations. Symmetric palat elevation. Symmetric arm raising.  Eyes: Conjunctiva clear., Pupils equal and round.   Head and Face: Facies symmetric, no obvious lesions.  Left ear: External Auditory Canals: Patent without inflammation. Tympanic Membrane intact, translucent, midposition, middle ear aerated  Right ear: External Auditory Canals: Patent without inflammation. Tympanic Membrane intact, translucent, midposition, middle ear aerated  Nose: external pyramid midline, septum midline,  mucosa normal,  no purulence,  polyps, or crusts   Oral Cavity/Oropharynx: No mucosal lesions, masses, or pharyngeal asymmetry.  Hypopharynx/Larynx: Voice mildly hoarse  Salivary glands: non-tender to palpation  Thyroid: no significant thyroid abnormality by palpation.  Neck:: no cervical adenopathy, no palpable thyroid or salivary gland masses and lower left neck in the region of the trapezium muscle was tender to palpation. Muscles feeling  hypertonic  Extremities: no cyanosis or edema  Skin: Skin warm and dry    Data reviewed:  I have reviewed the following results: Reviewed the note from Dr. Bobby Rumpf on 04/01/2020. [place] MRI reviewed.  Tonsillar herniation seen with obstruction of CSF in foramen magnum on T2.  No other lesion.  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  ? Shunt candidate?    Diagnosis and Plan    ICD-10-CM    1. Laryngopharyngeal reflux (LPR)  K21.9    2. Chiari-Frommel syndrome  O90.89     N91.1     O92.6    Caitlyn Gomez is a 44 y/o F with Chiari-Frommel syndrome, voice changes, dyspahgia and hiccoughs.  She has had anxiety and other worsening symptoms.  She has not yet seen Dr. Bobby Rumpf.   I suspect these are all related to her syndrome.  ASs part of this workup we will obtain an MBSS.    Incidental thyroid nodules are subcentimeter and without suspicious features.  They are not causing dysphagia. Repeat US in 2 years as they are small and have nonconcerning features.  We ordered a swallow study and increased her dose of omeprazole to 40 mg BID to help with her GERD symptoms that we believe are contributing to her hoarsness and dysphagia.  Today she says she is not taking this.  Follow-up after MBSS is scheduled for 05/17/20.  Suspect her symptoms are mostly related to brainstem compression.    No orders of the defined types were placed in this encounter.      Keyra Virella T. Radford Pax, MD  Skull Base Surgery/Head and Neck Surgery  Department of Otolaryngology-Head and Neck Surgery  [facility]        No notes on file

## 2020-05-02 ENCOUNTER — Encounter (HOSPITAL_BASED_OUTPATIENT_CLINIC_OR_DEPARTMENT_OTHER): Payer: Self-pay | Admitting: Otolaryngology

## 2020-05-03 ENCOUNTER — Other Ambulatory Visit: Payer: Self-pay

## 2020-05-03 ENCOUNTER — Ambulatory Visit: Payer: Medicaid Other | Attending: Neurological Surgery | Admitting: Neurological Surgery

## 2020-05-03 ENCOUNTER — Encounter (INDEPENDENT_AMBULATORY_CARE_PROVIDER_SITE_OTHER): Payer: Self-pay | Admitting: Neurological Surgery

## 2020-05-03 VITALS — BP 138/88 | HR 80 | Temp 97.0°F | Wt 318.3 lb

## 2020-05-03 DIAGNOSIS — O926 Galactorrhea: Secondary | ICD-10-CM | POA: Insufficient documentation

## 2020-05-03 DIAGNOSIS — G935 Compression of brain: Secondary | ICD-10-CM

## 2020-05-03 DIAGNOSIS — R066 Hiccough: Secondary | ICD-10-CM | POA: Insufficient documentation

## 2020-05-03 DIAGNOSIS — R1110 Vomiting, unspecified: Secondary | ICD-10-CM | POA: Insufficient documentation

## 2020-05-03 DIAGNOSIS — H538 Other visual disturbances: Secondary | ICD-10-CM | POA: Insufficient documentation

## 2020-05-03 DIAGNOSIS — N911 Secondary amenorrhea: Secondary | ICD-10-CM | POA: Insufficient documentation

## 2020-05-03 DIAGNOSIS — R519 Headache, unspecified: Secondary | ICD-10-CM | POA: Insufficient documentation

## 2020-05-03 DIAGNOSIS — O9089 Other complications of the puerperium, not elsewhere classified: Secondary | ICD-10-CM | POA: Insufficient documentation

## 2020-05-03 DIAGNOSIS — Z6841 Body Mass Index (BMI) 40.0 and over, adult: Secondary | ICD-10-CM

## 2020-05-03 NOTE — Progress Notes (Signed)
NEUROSURGERY, PHYSICIAN OFFICE CENTER  [place] [address]  Operated by [facility]  Progress Note    Name: Caitlyn Gomez MRN:  [id number]   Date: 05/03/2020 Age: 44 y.o.       Referring Provider:   Oley Balm, [address]  [place],  [place] [address]    Subjective:   Caitlyn Gomez is a 44 year old female returning to clinic with MRI cine flow. She was last seen 04/01/20 for cerebellar lesion discovered during head ache work up with plan for MRI cine flow and referral to Dr. Lissa Merlin (05/23/20). Seen by Dr. Radford Pax on 04/25/20 for Chiari-Frommel syndrome, voice changes, dysphagia and hiccoughs. Plan for MBSS.     Today, she reports continued vomiting and hiccups. Drinking ensure. She states Dr. Radford Pax mentioned feeding tube. Reports blurred vision, left neck tender mass, generalized weakness with weight loss, and intermittent frontal head aches.     History of thyroid nodules, cervical cancer with resultant hysterectomy, and fibromyalgia.    Current Outpatient Medications   Medication Sig   . Acetaminophen-Codeine (TYLENOL #2) 300-15 mg Tab take 1 Tab by mouth Every 4 hours as needed for Pain.   Marland Kitchen apixaban (ELIQUIS) 5 mg Oral Tablet Take 5 mg by mouth Once a day   . atorvastatin (LIPITOR) 40 mg Oral Tablet Take 40 mg by mouth Every evening   . bethanechol chloride (URECHOLINE) 5 mg Oral Tablet Take 1 Tablet by mouth Every night   . chlorproMAZINE (THORAZINE) 25 mg Oral Tablet Take 1 Tablet by mouth Twice daily   . citalopram (CELEXA) 20 mg Oral Tablet Take 1 Tablet (20 mg total) by mouth Once a day   . esomeprazole magnesium (NEXIUM) 40 mg CpDR take 1 Cap by mouth Daily before Breakfast.   . famotidine (PEPCID) 40 mg Oral Tablet Take 1 Tablet by mouth Twice daily   . isosorbide mononitrate (IMDUR) 30 mg Oral Tablet Sustained Release 24 hr Take 30 mg by mouth Every morning   . metoprolol succinate (TOPROL-XL) 50 mg Oral Tablet Sustained Release 24 hr Take 1 Tablet by mouth Once a  day   . nitroGLYCERIN (NITROSTAT) 0.3 mg Sublingual Tablet, Sublingual 0.3 mg by Sublingual route Every 5 minutes as needed for Chest pain for 3 doses over 15 minutes   . Norgestrel-Ethinyl Estradiol 0.3-30 mg-mcg Tab take 1 Tab by mouth. Take 4 pills on day 1  Take 3 pills on day 2  Take 2 pills on day 3  Take 1 pill daily   . omeprazole (PRILOSEC) 40 mg Oral Capsule, Delayed Release(E.C.) Take 1 Capsule (40 mg total) by mouth Twice daily   . PROAIR HFA 90 mcg/actuation Inhalation HFA Aerosol Inhaler Take 1 Puff by inhalation Every 4 hours as needed     Objective:   Vital Signs:  BP 138/88   Pulse 80   Temp 36.1 C (97 F) (Temporal)   Wt (!) 144 kg (318 lb 5.5 oz)   LMP 09/07/2009   SpO2 99%   BMI 51.38 kg/m       Constitutional  General appearance: In no acute distress   Eyes: Ophthalmic exam of optic discs and posterior segments: EOM intact   Cardiovascular:   Palpable pulses   HEENT:    HEENT:  Left clavicular region tenderness   Musculoskeletal  Gait and Station: : Not done, seated during exam   Muscle strength (upper extremities): : Normal  Muscle strength (lower extremities): :  Normal  Muscle tone (upper extremities): : Normal  Muscle tone (lower extremities): : Normal  Sensation: Normal   Coordination: Normal    Neurological  Orientation: Normal  Recent and remote memory: Normal  Attention span and concentration: Normal  Language: Normal  Fund of knowledge: Normal   Cranial Nerves  2nd: Normal  3rd,4th,6th: Normal  5th: Normal  7th: Not done  8th: Normal  9th: Not done  11th: Normal  12th: Not done    Data reviewed  04/25/20 MRI brain cine flow w/wo [place] PACS:  IMPRESSION:  Chiari I malformation.  Restricted CSF flow at the foramen magnum with narrowed ventral column and absent biphasic flow dorsally.    Discussions with other providers:   Reviewed chart notes     Assessment:    1. Blurred vision    2. Chiari Frommel syndrome    3. Headache    4. Vomiting, intractability of vomiting not specified,  presence of nausea not specified, unspecified vomiting type    5. Hiccups      Orders Placed This Encounter   . Referral to Gastroenterology     Recommendations:  Caitlyn Gomez is a 44 year old female returning to clinic with MRI cine flow:    -refer to GI for emesis and hiccups  -return to clinic in 3 months for clinical follow up     Cheral Marker, APRN,FNP-BC  As a shared visit with Dr. Bobby Rumpf     I personally saw and evaluated the patient. See mid-level's note for additional details. My findings/participation are Chiari with intractable hiccups, nausea. Chiari not that pronounced, no syrinx - still with flow ventral to stem. Will have GI eval    Dagmar Hait, MD

## 2020-05-09 ENCOUNTER — Ambulatory Visit (INDEPENDENT_AMBULATORY_CARE_PROVIDER_SITE_OTHER): Payer: Self-pay | Admitting: Gastroenterology

## 2020-05-17 ENCOUNTER — Other Ambulatory Visit: Payer: Self-pay

## 2020-05-17 ENCOUNTER — Ambulatory Visit
Admission: RE | Admit: 2020-05-17 | Discharge: 2020-05-17 | Disposition: A | Discharge status: Home / Self Care | Payer: Medicaid Other | Source: Ambulatory Visit | Attending: Otolaryngology | Admitting: Otolaryngology

## 2020-05-17 ENCOUNTER — Ambulatory Visit
Admission: RE | Admit: 2020-05-17 | Discharge: 2020-05-17 | Disposition: A | Discharge status: Still In Hospital | Payer: Medicaid Other | Source: Ambulatory Visit | Attending: Otolaryngology | Admitting: Otolaryngology

## 2020-05-17 DIAGNOSIS — R1310 Dysphagia, unspecified: Secondary | ICD-10-CM

## 2020-05-17 DIAGNOSIS — O926 Galactorrhea: Secondary | ICD-10-CM

## 2020-05-17 DIAGNOSIS — O9089 Other complications of the puerperium, not elsewhere classified: Secondary | ICD-10-CM | POA: Insufficient documentation

## 2020-05-17 DIAGNOSIS — N911 Secondary amenorrhea: Secondary | ICD-10-CM | POA: Insufficient documentation

## 2020-05-17 MED ORDER — BARIUM SULFATE 40 % (W/V), 30% (W/W) ORAL PASTE
20.0000 mL | PASTE | ORAL | Status: AC
Start: 2020-05-17 — End: 2020-05-17
  Administered 2020-05-17: 20 mL via ORAL

## 2020-05-17 MED ORDER — BARIUM SULFATE 81 % (W/W) ORAL POWDER
20.0000 mL | ORAL | Status: AC
Start: 2020-05-17 — End: 2020-05-17
  Administered 2020-05-17: 20 mL via ORAL

## 2020-05-17 MED ORDER — BARIUM SULFATE 40 % (W/V), 29 % (W/W) (1,500 CPS) ORAL SUSPENSION
5.0000 mL | ORAL | Status: AC
Start: 2020-05-17 — End: 2020-05-17
  Administered 2020-05-17: 5 mL via ORAL

## 2020-05-17 MED ORDER — BARIUM SULFATE 40% (W/V), 30% (W/W) ORAL SUSP (VARIBAR NECTAR)
15.0000 mL | ORAL | Status: AC
Start: 2020-05-17 — End: 2020-05-17
  Administered 2020-05-17: 15 mL via ORAL

## 2020-05-17 NOTE — Care Plan (Signed)
[facility]  Speech Therapy Modified Barium Swallow Study (MBSS)    Patient Name: Grayson White  Date of Birth: 08-Apr-1976  Payor: Alecia Lem[place] MEDICAID / Plan: Alecia Lemming [place] MEDICAID / Product Type: Medicaid [facility] /       Date/Time of Admission: 05/17/2020  8:30 AM  Admitting Diagnosis:  Chiari Frommel syndrome [O90.89, N91.1, O92.6]  Dysphagia, unspecified type [R13.10]      HPI:   Mikhala Kenan is a 44 y.o. female  who presents for an Outpatient MBSS.  Per referring service note: "Rhylen Pulido is a 44 y.o. 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 Patient reports having a left sided fluctuating neck mass for about 1.5-2 years duration. Nothing in particular causes this area to increase or decrease in size. It is tender and painful. She has been prescribed 2 different muscle relaxers in the past for this that have not helped. She underwent an US of the area that was unremarkable but did note incidental thyroid nodules that were less than 1cm in size. Patient also reporting dysphagia with trouble swallowing some foods and needing to take liquids to help her swallow. Also endorses a change in the quality of her voice. She has not undergone an MBSS but has seen an ENT who has performed flexible laryngoscopy and reported laryngeal changes associated with acid reflux and possibly muscle tension. She endorses feeling a great deal of anxiety and depression associated with all of these recent diagnosis and lab tests."  Today patient reports she has been vomiting after eating.       Past Medical History:   Diagnosis Date    A-fib (CMS [place])     Dr. Chauncey Cruel. [place]    Abnormal Pap smear     Anxiety     Asthma     Coronary artery disease     Depression     Diabetes     Dysplasia of cervix     Ectopic pregnancy      Fibromyalgia     GERD (gastroesophageal reflux disease)     HTN (hypertension)     Kidney disease     Neck mass     PCOS (polycystic ovarian syndrome)     Psychiatric problem     Stroke (CMS [facility])     TIA- [place] - 2016 - (left facial droop and left arm weakness)    Thyroid nodule     Vaginal infection     Varicosities          Past Surgical History:   Procedure Laterality Date    HX APPENDECTOMY      HX DILATION AND CURETTAGE      HX ENDOMETRIAL ABLATION      HX HYSTERECTOMY      HX TONSILLECTOMY      HX TUBAL LIGATION            reports that she has been smoking. She has a 15.00 pack-year smoking history. She has never used smokeless tobacco. She reports current alcohol use of about 0.8 standard drinks of alcohol per week. She reports that she does not use drugs.  Social History     Tobacco Use   Smoking Status Current Every Day Smoker    Packs/day: 1.00    Years: 15.00    Pack years: 15.00  Smokeless Tobacco Never Used         Subjective:     Alert:yes  Cooperative:yes  Follows Directions:yes  Dentition:Edentulous  Trach:no  Positioning:Chair   Respiratory status:Room Air   Nutrition:PO diet       Objective:     Radiographic View:Lateral  Position:Sitting  Contrast Consistencies:Thin Liquid, Level 2 Liquids: Mildly Thick (Nectar), Level 3 Liquids: Moderately Thick (Honey) and Pudding  Total Number of Presentations: 8  Oral Phase:WFL, Piecemeal Swallow with pudding only, Bolus formation good and A-P Transit delayed briefly.   Pharyngeal Phase:WFL Timely initiation of pharyngeal swallow when bolus head at tongue base with honey and pudding; brief delay with pooling in pyriforms with thin and nectar consistencies. Flash and very scant  penetration one time each with nectar and thin. Excellent laryngeal elevation and good hyoid to mandible range. No-trace residue readily cleared with 2nd swallow which patient took spontaneously. Good bolus propulsion and no obstruction of bolus at PES. Note that  patient did have an emesis after the second bite of pudding which was abrupt. No contrast observed in airway upon immediate subsequent review.   Epiglottic Inversion:yes  Esophageal Phase : No backflow at proximal esophagus  Penetration- Aspiration Scale: 2-Material enters the airway, remains above the vocal folds, and is ejected from the airway.    Assessment:   Impressions: WFL swallowing for consistencies and amounts used in this study. No aspiration. Flash penetration once with thin and once with nectar/mildly thin consistencies. Timely initiation of pharyngeal swallow for honey and pudding consistencies; slight delay with bolus head at pyriforms for thin and nectar. Good swallow excursion.  thin and nectar pooling; no-trace residuals readily cleared with second swallow. Good bolus propulsion; no obstruction of bolus flow. No proximal esophageal retention observed.  Good oral phase control, transit, and clearance. Patient did have an emesis after the second bite of pudding. No contrast observed in airway on immediate subsequent review.       Goals:   Short term goals:   1. Safe and adequate po intake at least restrictive diet level to meet daily nutritive and hydration needs.   2. Strict adherence to aspiration precautions at 95-100% compliance.   3. Strict adherence to acid reflux management guidelines at 95-100% compliance.     Long term goal:  Adequate and safe PO intake and swallow function to meet daily nutritional and hydration needs.         Plan:   Recommendations - Diet: Regular- Easy to Chew  Recommendations - Liquid: Level 0 Liquids: Regular/Thin  Aspiration Precautions: Small bites, Small sips, Swallow each bite/sip before taking next, Minimize distractions, Multiple swallows and Remain upright after meals for  60 minutes after eating. Allow time between bites and be sure to coordinate breathing and swallowing.   Treatment Strategies: None warranted  Other Recommendations: GI consult and Neurology  consult  Results & Recommendations Discussed With:Patient at length after MBSS completed.     Continue to follow patient according to established plan of care.  The risks/benefits of therapy have been discussed with the patient/caregiver and he/she is in agreement with the established plan of care.     Therapist:   Rosita Fire, SLP   Pager #: [phone]   Treatment Time: 120 minutes

## 2020-05-23 ENCOUNTER — Ambulatory Visit (HOSPITAL_BASED_OUTPATIENT_CLINIC_OR_DEPARTMENT_OTHER)
Admission: RE | Admit: 2020-05-23 | Discharge: 2020-05-23 | Disposition: A | Discharge status: Home / Self Care | Payer: Medicaid Other | Source: Ambulatory Visit

## 2020-05-23 ENCOUNTER — Encounter (HOSPITAL_BASED_OUTPATIENT_CLINIC_OR_DEPARTMENT_OTHER): Payer: Self-pay | Admitting: Otolaryngology

## 2020-05-23 ENCOUNTER — Ambulatory Visit (HOSPITAL_BASED_OUTPATIENT_CLINIC_OR_DEPARTMENT_OTHER): Payer: Medicaid Other | Admitting: Ophthalmology

## 2020-05-23 ENCOUNTER — Other Ambulatory Visit: Payer: Self-pay

## 2020-05-23 ENCOUNTER — Encounter (INDEPENDENT_AMBULATORY_CARE_PROVIDER_SITE_OTHER): Payer: Self-pay | Admitting: Ophthalmology

## 2020-05-23 ENCOUNTER — Ambulatory Visit (INDEPENDENT_AMBULATORY_CARE_PROVIDER_SITE_OTHER): Payer: Self-pay | Admitting: Ophthalmology

## 2020-05-23 ENCOUNTER — Ambulatory Visit
Admission: RE | Admit: 2020-05-23 | Discharge: 2020-05-23 | Disposition: A | Discharge status: Home / Self Care | Payer: Medicaid Other | Source: Ambulatory Visit | Attending: Ophthalmology | Admitting: Ophthalmology

## 2020-05-23 ENCOUNTER — Ambulatory Visit (HOSPITAL_BASED_OUTPATIENT_CLINIC_OR_DEPARTMENT_OTHER): Payer: Medicaid Other | Admitting: Otolaryngology

## 2020-05-23 VITALS — BP 114/62 | HR 69 | Temp 96.8°F | Resp 18 | Ht 66.0 in | Wt 309.4 lb

## 2020-05-23 DIAGNOSIS — Z6841 Body Mass Index (BMI) 40.0 and over, adult: Secondary | ICD-10-CM

## 2020-05-23 DIAGNOSIS — E042 Nontoxic multinodular goiter: Secondary | ICD-10-CM | POA: Insufficient documentation

## 2020-05-23 DIAGNOSIS — N911 Secondary amenorrhea: Secondary | ICD-10-CM

## 2020-05-23 DIAGNOSIS — K219 Gastro-esophageal reflux disease without esophagitis: Secondary | ICD-10-CM

## 2020-05-23 DIAGNOSIS — M542 Cervicalgia: Secondary | ICD-10-CM

## 2020-05-23 DIAGNOSIS — R519 Headache, unspecified: Secondary | ICD-10-CM

## 2020-05-23 DIAGNOSIS — H538 Other visual disturbances: Secondary | ICD-10-CM

## 2020-05-23 DIAGNOSIS — G935 Compression of brain: Secondary | ICD-10-CM | POA: Insufficient documentation

## 2020-05-23 DIAGNOSIS — H52209 Unspecified astigmatism, unspecified eye: Secondary | ICD-10-CM | POA: Insufficient documentation

## 2020-05-23 DIAGNOSIS — H5340 Unspecified visual field defects: Secondary | ICD-10-CM | POA: Insufficient documentation

## 2020-05-23 DIAGNOSIS — R066 Hiccough: Secondary | ICD-10-CM | POA: Insufficient documentation

## 2020-05-23 NOTE — Telephone Encounter (Signed)
Spoke to [place] at ENT. Advised pt was getting photos done and then should be over. Garey Ham, RN  05/23/2020, 10:55

## 2020-05-23 NOTE — Addendum Note (Signed)
Addended by: Gwen Her D on: 05/23/2020 10:26 AM     Modules accepted: Orders

## 2020-05-23 NOTE — Progress Notes (Signed)
ENT, [place]  [place] [address]  [phone]    Patient Name: Caitlyn Gomez   MRN:  [id number]   DOB: 1975-09-28   Date of Service:  05/23/2020     ENT HEAD AND NECK ONCOLOGY SURGERY AND RECONSTRUCTION     FOLLOW UP NOTE     HPI: This is a 44 y.o. 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 Patient reports having a left sided fluctuating neck mass for about 1.5-2 years duration. Nothing in particular causes this area to increase or decrease in size. It is tender and painful. She has been prescribed 2 different muscle relaxers in the past for this that have not helped. She underwent an US of the area that was unremarkable but did note incidental thyroid nodules. Incidental thyroid nodules are subcentimeter and without suspicious features.     She has seen an ENT who has performed flexible laryngoscopy and reported laryngeal changes associated with acid reflux and possibly muscle tension. She is now taking Pepcid and Omeprazole.     MBSS reviewed today and was normal.     She saw Dr. Bobby Rumpf on 10/15 who confirmed the Chiari I malformation and has referred her to GI.       Past Medical History:  Past Medical History:   Diagnosis Date    A-fib (CMS HCC)     Dr. Chauncey Cruel. [facility]    Abnormal Pap smear     Anxiety     Arthropathy     Asthma     Coronary artery disease     Depression     Diabetes     Dysplasia of cervix     Ectopic pregnancy     Fibromyalgia     GERD (gastroesophageal reflux disease)     HTN (hypertension)     Kidney disease     Neck mass     PCOS (polycystic ovarian syndrome)     Psychiatric problem     Stroke (CMS [facility])     TIA- [place] - 2016 - (left facial droop and left arm weakness)    Thyroid nodule     Vaginal infection     Varicosities            Past Surgical History:  Past Surgical History:   Procedure Laterality Date    Hx appendectomy      Hx dilation and curettage      Hx endometrial ablation      Hx hysterectomy      Hx  tonsillectomy      Hx tubal ligation         Family History:  Family Medical History:       Problem Relation (Age of Onset)    Diabetes Maternal Grandmother, Paternal Aunt    Healthy Mother    Heart Disease Father    Hypertension (High Blood Pressure) Father                 MEDICATIONS:    Current Outpatient Medications:     apixaban (ELIQUIS) 5 mg Oral Tablet, Take 5 mg by mouth Once a day, Disp: , Rfl:     atorvastatin (LIPITOR) 40 mg Oral Tablet, Take 40 mg by mouth Every evening, Disp: , Rfl:     bethanechol chloride (URECHOLINE) 5 mg Oral Tablet, Take 1 Tablet by mouth Every night, Disp: , Rfl:     chlorproMAZINE (THORAZINE) 25 mg Oral Tablet, Take 1 Tablet  by mouth Twice daily, Disp: , Rfl:     citalopram (CELEXA) 20 mg Oral Tablet, Take 1 Tablet (20 mg total) by mouth Once a day, Disp: 30 Tablet, Rfl: 1    famotidine (PEPCID) 40 mg Oral Tablet, Take 1 Tablet by mouth Twice daily, Disp: , Rfl:     isosorbide mononitrate (IMDUR) 30 mg Oral Tablet Sustained Release 24 hr, Take 30 mg by mouth Every morning, Disp: , Rfl:     metoprolol succinate (TOPROL-XL) 50 mg Oral Tablet Sustained Release 24 hr, Take 1 Tablet by mouth Once a day, Disp: , Rfl:     nitroGLYCERIN (NITROSTAT) 0.3 mg Sublingual Tablet, Sublingual, 0.3 mg by Sublingual route Every 5 minutes as needed for Chest pain for 3 doses over 15 minutes, Disp: , Rfl:     omeprazole (PRILOSEC) 40 mg Oral Capsule, Delayed Release(E.C.), Take 1 Capsule (40 mg total) by mouth Twice daily, Disp: 90 Capsule, Rfl: 4    PROAIR HFA 90 mcg/actuation Inhalation HFA Aerosol Inhaler, Take 1 Puff by inhalation Every 4 hours as needed, Disp: , Rfl:      ALLERGIES:  Allergy History as of 05/23/20       SULFA (SULFONAMIDES)         Noted Status Severity Type Reaction    01/24/10 0300 Haddix, Renaye Rakers 01/24/10 Active                 AMOXICILLIN         Noted Status Severity Type Reaction    01/24/10 0301 Haddix, Renaye Rakers 01/24/10 Active                        PHYSICAL  EXAM:  BP 114/62    Pulse 69    Temp 36 C (96.8 F)    Resp 18    Ht 1.676 m (5\' 6" )    Wt (!) 140 kg (309 lb 6.4 oz)    LMP 09/07/2009    SpO2 100%    BMI 49.94 kg/m       Body mass index is 49.94 kg/m.  General Appearance: pleasant, cooperative, no distress  Psychiatric: AOx3  Neurologic: grossly normal CN II-XII exam. Specifically, CN XII intact, no evidence of tongue fasiculations. Symmetric palat elevation. Symmetric arm raising.  Eyes: Conjunctiva clear., Pupils equal and round.   Head and Face: Facies symmetric, no obvious lesions.  Left ear: External Auditory Canals: Patent without inflammation. Tympanic Membrane intact, translucent, midposition, middle ear aerated  Right ear: External Auditory Canals: Patent without inflammation. Tympanic Membrane intact, translucent, midposition, middle ear aerated  Nose: external pyramid midline, septum midline,  mucosa normal,  no purulence,  polyps, or crusts   Oral Cavity/Oropharynx: No mucosal lesions, masses, or pharyngeal asymmetry.  Hypopharynx/Larynx: Voice mildly hoarse  Salivary glands: non-tender to palpation  Thyroid: no significant thyroid abnormality by palpation.  Neck:: no cervical adenopathy, no palpable thyroid or salivary gland masses and lower left neck in the region of the trapezium muscle was tender to palpation. Muscles feeling hypertonic  Extremities: no cyanosis or edema  Skin: Skin warm and dry    LABS: n/a  PATHOLOGY: No results found for this or any previous visit (from the past 720 hour(s)).   IMAGING:   Recent Results (from the past 720 hour(s))   US THYROID     Status: None    Narrative    [place]  Female, 44 years old.    US THYROID  performed on 04/25/2020 11:10 AM.    REASON FOR EXAM:  R22.1: Neck mass  E04.1: Thyroid nodule    COMPARISON: Outside thyroid 02/08/2020    FINDINGS:  THYROID: The right thyroid gland measures 5.1 x 1.1 x 1.5 cm, length, AP, transverse. The left thyroid gland measures 4.5 x 1.3 x 1.5 cm, length, AP,  transverse. The isthmus measures 2 mm. Vascular flow to the glands are unremarkable. The thyroid parenchyma is homogeneous in appearance.Marland Kitchen    NODULES:  Single nodule in the left mid thyroid gland measures 7 x 4 x 7 mm previously measuring 6 x 4 x 5 mm on outside exam..        Impression    1. Single subcentimeter left thyroid nodule minimally changed from prior outside exam taking in considerations differences in technique.  2. Thyroid gland is otherwise unremarkable in appearance.   MRI CERVICAL W CINE FLOW W/O AND W CONTRAST     Status: None    Narrative    INDICATION: 44 years old Female. 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 0.8       GFR   >90        on 04/25/2020    COMPARISON: Outside MRI brain 01/30/2020.    FINDINGS:   The vertebral body height and alignment is preserved. Bone marrow signals are unremarkable. The spinal canal and neural foramina are preserved. No disc herniation is seen. There is moderate facet arthropathy at C4-C5 on the left.    There is redemonstration of peg-like right cerebellar tonsil that extends 8.6 mm below the level of McRae line. The cine flow images demonstrate narrowed but patent biphasic flow ventrally at the foramen magnum. There is an absent dorsal CSF flow column. No abnormal postcontrast enhancement is identified.        Impression    Chiari I malformation. No evidence of cord syrinx. Restriction of CSF flow seen at the foramen magnum.   MRI BRAIN CINE FLOW W/WO CONTRAST     Status: None    Narrative    [place] MAE Le[place]inen  Female, 44 years old.    MRI BRAIN CINE FLOW W/WO CONTRAST performed on 04/25/2020 7:40 PM.    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   0.8     GFR  >90         on 04/25/2020    TECHNIQUE: MRI brain with CINE flow protocol with and without intravenous contrast.    COMPARISON: Outside MRI brain 04/25/2020    FINDINGS: Redemonstration of peg shaped right cerebellar tonsil that extends 8 mm below the level of McRae line. Cine flow imaging demonstrates a narrowed ventral CSF column at the foramen magnum however with patent biphasic CSF flow. No flow is seen dorsally at the foramen magnum. There is no evidence of diffusion restriction to suggest acute stroke. No hemosiderin staining on susceptibility weighted imaging. No abnormal areas of enhancement or signal intensity. Ventricles and sulci are normal in size.  Impression    Chiari I malformation.  Restricted CSF flow at the foramen magnum with narrowed ventral column and absent biphasic flow dorsally.   FLUORO ESOPHAGRAM, MODIFIED SWALLOW     Status: None    Narrative    [place] MAE Livecchi  Female, 44 years old.    FLUORO ESOPHAGRAM, MODIFIED SWALLOW performed on 05/17/2020 9:10 AM.    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  Please correlate with findings described by the referring service at time of this study for complete assessment.      Impression    Modified barium swallow procedure as described above.       MEDIA:  No notes on file    ASSESSMENT   This is a 44 y.o. 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 Patient reports having a left sided fluctuating neck mass for about 1.5-2 years duration. She underwent an US of the area that was unremarkable but did  note incidental thyroid nodules. Incidental thyroid nodules are subcentimeter and without suspicious features.     She has seen an ENT who has performed flexible laryngoscopy and reported laryngeal changes associated with acid reflux and possibly muscle tension. She is now taking Pepcid and Omeprazole.     MBSS reviewed today and was normal.     She saw Dr. Bobby Rumpf on 10/15 who confirmed the Chiari I malformation and has referred her to GI.       ICD-10-CM    1. Neck pain  M54.2         PLAN:  No orders of the defined types were placed in this encounter.    Constellation of symptoms unlikely to be caused by H&N etiology. Reflux may be causing intermittent episodes of laryngospasm. Continue taking Pepcid and Omeprazole. Agree with GI referral and continued follow up with Neurosurgery. Follow up with ENT prn.     Isaac Bliss, MD 05/23/2020 11:50     I personally performed the history and physical examination on the day of this visit.  I also personally reviewed the laboratory results and imaging with the resident on the day of this visit.  I agree with the above findings and plan as documented.     Christyl Osentoski T. Radford Pax, MD  Skull Base Surgery/Head and Neck Surgery  Department of Otolaryngology-Head and Neck Surgery  [place], MD    CC:    PCP Oley Balm, MD  [address]  [place] [address]   Referring Provider Oley Balm, [place]  [place],  [place] [address]

## 2020-05-23 NOTE — Progress Notes (Addendum)
[facility]  [place] [address]  Operated by [place]         Patient Name: Caitlyn Gomez  MRN#: [id number]  Wall: [birth year]    Date of Service: 05/23/2020    Chief Complaint     New Patient          Caitlyn Gomez is a 44 y.o. 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 (History)        ROS     Positive for: Gastrointestinal, Neurological, Musculoskeletal, Eyes    Negative for: Endocrine, Respiratory, Allergic/Imm    Last edited by Guilford Shi, COMT on 05/23/2020  8:34 AM. (History)         All other systems Negative    Guilford Shi, COMT 05/23/2020, 08:34   Base Eye Exam     Visual Acuity (Snellen - Linear)       Right Left    Dist sc 20/40 20/200    Dist ph sc  20/100 -2   had to go to 20/400 then work down to 20/40 OD state unable to see any letters           Visual Fields       Right Left     Full Full          Extraocular Movement       Right Left     Full Full          Neuro/Psych     Oriented x3: Yes    Mood/Affect: Normal            Additional Tests     Color       Right Left    [place] 12.5 11.5            Refraction     Manifest Refraction (Retinoscopy)       Sphere Cylinder Axis Dist VA Add Near [place]    Right -0.25 +2.00 107 20/30- push +2.00 20/30    Left -2.25 +3.25 085 20/80-2 push  +2.00 20/60          Manifest Refraction #2 (Auto)       Sphere Cylinder Axis Dist VA Add Near [place]    Right -0.50 +1.25 108       Left -0.75 +2.75  61                   MD Addition to HPI: lost weight with lack of appetite and headache in back of head and had lymph node problem and neurologist pinpointed mass in brain and found to have chiari syndrome.  Had veins that one was not working on the right.  Has a lot of headaches. Has a little swishing sound in head. Vision has gotten really bad, going on for one year and all of a sudden looks like a bunch of flutters  and twitching and was staring and had squiggly line.  Has HTN but no dm. Had a cva and had heart caths and had appendix rupture. 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 Told she had cervical cancer and had hysterectomy.  No glaucoma or rd or eye surgery.            Ophthalmic Plan of Care:    1) hx chiari malformation  -no papilledema on exam, crisp symmetric disc margins without pallor   -no dbn      2) refractive error with astigmatism  - No HX of amblyopia or strabismus as a child  - BCVA 20/30, 20/60  -mild ns and astigmatism  - No mis-alignment on exam  -rnfl and oct macula today  -constricted vf on hvf but intact on cf ou and has normal saccade accuracy to peripheral targets od an os  -rv 71mos with gvf  -corneal topography  -has limited thorazine use for  hiccups        Follow up:    I have asked Andrina Locken to follow up in 71mos          I have seen and examined the above patient. I discussed the above diagnoses listed in the assessment and the above ophthalmic plan of care with the patient and patient's family. All questions were answered. I reviewed and, when necessary, made changes to the technician/resident note, documented ophthalmology exam, chief complaint, history of present illness, allergies, review of systems, past medical, past surgical, family and social history. I personally reviewed and interpreted all testing and/or imaging performed at this visit and agree with the resident's or fellow's interpretation. Any exceptions/additions are edited/noted in the relevant encounter  fields.      Gwen Her, MD  05/23/2020, 09:40      I saw and examined the patient.  I reviewed the resident's or fellow's note.  I agree with the findings and plan of care as documented in the resident's or fellow's note.  I personally reviewed and interpreted all testing and/or imaging performed at this visit and agree with the resident's or fellow's interpretation. Any exceptions/additions are edited/noted.     Gwen Her, MD 05/23/2020, 10:22

## 2020-05-23 NOTE — Telephone Encounter (Signed)
Regarding: ellis  ----- Message from Sanjuan Dame sent at 05/23/2020 10:49 AM EDT -----  Jackelyn Poling, a nurse [facility] with E[facility], called in to see how much longer pt is going to be and if she's still coming to her ENT apt that was at [facility] with Dr. Radford Pax today. Please return call to ext [phone] to advise. Thank you.

## 2020-05-23 NOTE — Addendum Note (Signed)
Addended by: Gwen Her D on: 05/23/2020 09:49 AM     Modules accepted: Orders

## 2020-07-09 ENCOUNTER — Ambulatory Visit (INDEPENDENT_AMBULATORY_CARE_PROVIDER_SITE_OTHER): Payer: Self-pay | Admitting: Gastroenterology

## 2020-07-11 ENCOUNTER — Ambulatory Visit (INDEPENDENT_AMBULATORY_CARE_PROVIDER_SITE_OTHER): Payer: Self-pay | Admitting: Gastroenterology

## 2020-07-30 ENCOUNTER — Encounter (INDEPENDENT_AMBULATORY_CARE_PROVIDER_SITE_OTHER): Payer: Self-pay | Admitting: Gastroenterology

## 2020-07-30 ENCOUNTER — Ambulatory Visit (INDEPENDENT_AMBULATORY_CARE_PROVIDER_SITE_OTHER): Payer: Medicaid Other | Admitting: Rheumatology

## 2020-07-30 ENCOUNTER — Other Ambulatory Visit: Payer: Self-pay

## 2020-07-30 ENCOUNTER — Ambulatory Visit: Payer: Medicaid Other | Attending: NURSE PRACTITIONER | Admitting: Gastroenterology

## 2020-07-30 VITALS — BP 141/88 | HR 75 | Temp 97.3°F | Ht 66.0 in | Wt 314.4 lb

## 2020-07-30 DIAGNOSIS — Z6841 Body Mass Index (BMI) 40.0 and over, adult: Secondary | ICD-10-CM | POA: Insufficient documentation

## 2020-07-30 DIAGNOSIS — K5900 Constipation, unspecified: Secondary | ICD-10-CM | POA: Insufficient documentation

## 2020-07-30 DIAGNOSIS — K219 Gastro-esophageal reflux disease without esophagitis: Secondary | ICD-10-CM | POA: Insufficient documentation

## 2020-07-30 DIAGNOSIS — K625 Hemorrhage of anus and rectum: Secondary | ICD-10-CM | POA: Insufficient documentation

## 2020-07-30 DIAGNOSIS — R109 Unspecified abdominal pain: Secondary | ICD-10-CM

## 2020-07-30 DIAGNOSIS — R1110 Vomiting, unspecified: Secondary | ICD-10-CM

## 2020-07-30 DIAGNOSIS — R066 Hiccough: Secondary | ICD-10-CM | POA: Insufficient documentation

## 2020-07-30 DIAGNOSIS — K760 Fatty (change of) liver, not elsewhere classified: Secondary | ICD-10-CM | POA: Insufficient documentation

## 2020-07-30 DIAGNOSIS — R140 Abdominal distension (gaseous): Secondary | ICD-10-CM

## 2020-07-30 DIAGNOSIS — R1310 Dysphagia, unspecified: Secondary | ICD-10-CM

## 2020-07-30 LAB — HEPATIC FUNCTION PANEL
ALBUMIN: 3.5 g/dL (ref 3.5–5.0)
ALKALINE PHOSPHATASE: 77 U/L (ref 40–110)
ALT (SGPT): 18 U/L (ref 8–22)
AST (SGOT): 17 U/L (ref 8–45)
BILIRUBIN DIRECT: 0.1 mg/dL (ref 0.1–0.4)
BILIRUBIN TOTAL: 0.3 mg/dL (ref 0.3–1.3)
PROTEIN TOTAL: 7.1 g/dL (ref 6.4–8.3)

## 2020-07-30 LAB — H & H
HCT: 41.9 % (ref 34.8–46.0)
HGB: 13.2 g/dL (ref 11.5–16.0)

## 2020-07-30 LAB — HEPATITIS B SURFACE ANTIGEN: HBV SURFACE ANTIGEN QUALITATIVE: NEGATIVE

## 2020-07-30 LAB — HEPATITIS A (HAV) IGG ANTIBODY: HAV IGG ANTIBODY: NEGATIVE

## 2020-07-30 LAB — HEPATITIS C ANTIBODY SCREEN WITH REFLEX TO HCV PCR: HCV ANTIBODY QUALITATIVE: NEGATIVE

## 2020-07-30 LAB — HEPATITIS B CORE ANTIBODY: HBV CORE TOTAL ANTIBODIES: NEGATIVE

## 2020-07-30 LAB — LIPASE: LIPASE: 19 U/L (ref 10–60)

## 2020-07-30 LAB — HEPATITIS B SURFACE ANTIBODY: HBV SURFACE ANTIBODY QUANTITATIVE: 65 m[IU]/mL — ABNORMAL HIGH (ref <8)

## 2020-07-30 MED ORDER — PANTOPRAZOLE 40 MG TABLET,DELAYED RELEASE
40.0000 mg | DELAYED_RELEASE_TABLET | Freq: Two times a day (BID) | ORAL | 11 refills | Status: DC
Start: 2020-07-30 — End: 2022-11-04

## 2020-07-30 MED ORDER — PANTOPRAZOLE 40 MG TABLET,DELAYED RELEASE
40.0000 mg | DELAYED_RELEASE_TABLET | Freq: Every day | ORAL | 11 refills | Status: DC
Start: 2020-07-30 — End: 2020-07-30

## 2020-07-30 NOTE — H&P (Unsigned)
[place]     REASON FOR VISIT: GERD; Hiccups    REFERRING PHYSICIAN: Cheral Marker APRN; Dagmar Hait MD    HISTORY OF PRESENT ILLNESS: Caitlyn Gomez is a 45 y.o. female with a history of Chiari Frommel syndrome, GERD, DM, and HTN who was referred to the [facility] Gastroenterology Medicine section of Gastroenterology & Hepatology for evaluation of GERD and hiccups.    [name] reported that she was initially diagnosed with heartburn in her 41s.  She was subsequently been on Omeprazole 40 mg daily, but in my review with her today, she does not take it correctly.  She endorsed having ongoing hiccups, which "take her breath away".    Additionally, [name] reported having dysphagia for the past 3 years.  She described it as sticking in the cervical area, occurring with solids, progressive, occurring 1-2 times a week.  She denied odynophagia, difficulty with initiation of swallow, prior radiation/surgery to esophagus, or prior consumption of caustic material.  She occasionally will have coughing after swallowing.  She underwent an MBS with SLP in 04/2020, during which there was no aspiration, she was recommended to do a regular diet with thin liquids.  She also previously was seen by ENT who has performed flexible laryngoscopy and reported laryngeal changes associated with acid reflux and possibly muscle tension.    [name] indicated that she was following locally with GI.  She reported that an EGD had been attempted a few months ago, but the procedure was aborted due to the fact she aspirated.  She endorse having a colonoscopy done in 03/2020, which showed diverticulosis.    [name] indicated that she has 7-8 BMs daily (irregular bowel habits, alternating between hard stools to liquid).  She does not take a bowel regimen.  She reported associated excessive flatus and abdominal bloating.  She has red blood on the stool 1-2 times a year, which has been ongoing for 'years'.     She  reported abdominal pain, located in her upper quadrants, described as an 'ache and burning', occurring 2 times a week, lasting hours in duration, improved with defecation and sometimes when she takes a second dose of omeprazole.  She reported having H pylori in the past, for which she thinks she was treated with Prevpac.      She denied hematemesis, melena, nausea, fevers, chills, oral or perianal ulcers, vision changes, skin rash, odynophagia, SOB, chest pain, or lightheadedness.     [name] reports a history of NAFLD.  She has been working on weight loss.  She limit high sugar containing drinks, she lost 10 lbs in the past 3 months.  She does not do any regimented exercise, but reported being active.     She indicated that her maternal grandmother had colon polyps in her 59s and maternal grandfather had colon cancer in his 62s.  She has a maternal aunt with stomach cancer in her 62s.  She also has a cousin with Crohn's disease.        PAST MEDICAL AND SURGICAL HISTORY:   Past Medical History:   Diagnosis Date   . A-fib (CMS HCC)     Dr. Chauncey Cruel. Lucio Edward   . Abnormal Pap smear    . Anxiety    . Arthropathy    . Asthma    . Coronary artery disease    . Depression    . Diabetes    . Dysplasia of cervix    . Ectopic pregnancy    .  Fibromyalgia    . GERD (gastroesophageal reflux disease)    . HTN (hypertension)    . Kidney disease    . NAFLD (nonalcoholic fatty liver disease)    . Neck mass    . PCOS (polycystic ovarian syndrome)    . Psychiatric problem    . Stroke (CMS [facility])     TIA- [place] - 2016 - (left facial droop and left arm weakness)   . Thyroid nodule    . Vaginal infection    . Varicosities      Past Surgical History:   Procedure Laterality Date   . HX APPENDECTOMY     . HX DILATION AND CURETTAGE     . HX ENDOMETRIAL ABLATION     . HX HYSTERECTOMY     . HX TONSILLECTOMY     . HX TUBAL LIGATION       CURRENT MEDICATIONS:   Outpatient Medications Marked as Taking for the 07/30/20 encounter (Office Visit) with Keane Scrape, DO   Medication Sig   . apixaban (ELIQUIS) 5 mg Oral Tablet Take 5 mg by mouth Once a day   . atorvastatin (LIPITOR) 40 mg Oral Tablet Take 40 mg by mouth Every evening   . bethanechol chloride (URECHOLINE) 5 mg Oral Tablet Take 1 Tablet by mouth Every night   . chlorproMAZINE (THORAZINE) 25 mg Oral Tablet Take 1 Tablet by mouth Twice daily   . citalopram (CELEXA) 20 mg Oral Tablet Take 1 Tablet (20 mg total) by mouth Once a day   . isosorbide mononitrate (IMDUR) 30 mg Oral Tablet Sustained Release 24 hr Take 30 mg by mouth Every morning   . metoprolol succinate (TOPROL-XL) 50 mg Oral Tablet Sustained Release 24 hr Take 1 Tablet by mouth Once a day   . nitroGLYCERIN (NITROSTAT) 0.3 mg Sublingual Tablet, Sublingual 0.3 mg by Sublingual route Every 5 minutes as needed for Chest pain for 3 doses over 15 minutes   . omeprazole (PRILOSEC) 40 mg Oral Capsule, Delayed Release(E.C.) Take 1 Capsule (40 mg total) by mouth Twice daily   . pantoprazole (PROTONIX) 40 mg Oral Tablet, Delayed Release (E.C.) Take 1 Tablet (40 mg total) by mouth Twice daily Take on empty stomach, eat 30-60 mins later   . PROAIR HFA 90 mcg/actuation Inhalation HFA Aerosol Inhaler Take 1 Puff by inhalation Every 4 hours as needed     ALLERGIES:   Allergies   Allergen Reactions   . Amoxicillin    . Sulfa (Sulfonamides)      FAMILY HISTORY:  Family Medical History:     Problem Relation (Age of Onset)    Colon Cancer Maternal Grandfather    Colon Polyps Maternal Grandmother    Diabetes Maternal Grandmother, Paternal 29    Healthy Mother    Heart Disease Father    Hypertension (High Blood Pressure) Father    Inflammatory Bowel Dz Maternal cousin    Stomach Cancer Maternal Aunt    Sudden Death no cause Other      She indicated that her maternal grandmother had colon polyps in her 42s and maternal grandfather had colon cancer in his 50s.  She has a maternal aunt with stomach cancer in her 33s.  She also has a cousin with Crohn's disease.       SOCIAL HISTORY:  Social History     Socioeconomic History   . Marital status: Legally Separated     Spouse name: Not on file   . Number of children: 2   .  Years of education: Not on file   . Highest education level: Not on file   Occupational History   . Occupation: Engineer, mining   Tobacco Use   . Smoking status: Current Every Day Smoker     Packs/day: 1.00     Years: 15.00     Pack years: 15.00   . Smokeless tobacco: Never Used   Vaping Use   . Vaping Use: Never used   Substance and Sexual Activity   . Alcohol use: Yes     Alcohol/week: 0.8 standard drinks     Types: 1 Glasses of wine per week     Comment: 2-3 times a month;   Marland Kitchen Drug use: Never   . Sexual activity: Not Currently     Partners: Male     Birth control/protection: Other   Other Topics Concern   . Abuse/Domestic Violence Not Asked   . Breast Self Exam Not Asked   . Caffeine Concern Not Asked   . Calcium intake adequate Not Asked   . Computer Use Not Asked   . Drives Not Asked   . Exercise Concern Not Asked   . Helmet Use Not Asked   . Seat Belt Not Asked   . Special Diet Not Asked   . Sunscreen used Not Asked   . Uses Cane Not Asked   . Uses walker Not Asked   . Uses wheelchair Not Asked   . Right hand dominant Not Asked   . Left hand dominant Not Asked   . Ambidextrous Not Asked   . Shift Work Not Asked   . Unusual Sleep-Wake Schedule Not Asked   Social History Narrative    1 child deceased, 1 living     Social Determinants of Health     Financial Resource Strain: Not on file   Food Insecurity: Not on file   Transportation Needs: Not on file   Physical Activity: Not on file   Stress: Not on file   Intimate Partner Violence: Not on file     REVIEW OF SYSTEMS: A 14-point review of systems was obtained and pertinent positives and negatives were enumerated above in the history of present illness. All other reviewed systems / symptoms were negative.     PHYSICAL EXAMINATION:   VITAL SIGNS: BP (!) 141/88   Pulse 75   Temp 36.3 C (97.3 F)  (Thermal Scan)   Ht 1.676 m (5\' 6" )   Wt (!) 143 kg (314 lb 6 oz)   LMP 09/07/2009   SpO2 100%   BMI 50.74 kg/m     ,Body mass index is 50.74 kg/m.  GENERAL: Alert, oriented, cooperative and no distress noted   HEENT: Normocephalic, mucous membranes were pink and moist and sclera were anicteric, the oral mucosa had no erythema and no buccal mucosal lesions. Dentition was intact. The neck is supple.  CARDIOVASCULAR: Normal rate and regular rhythm  PULMONARY: Clear to auscultation, with good air entry throughout. No wheezes, rales or rhonchi.  GI: Normal bowel sounds. Soft, nondistended, mild tenderness in upper quadrants. No rebound or guarding.   EXTREMITIES: Normal and without clubbing, cyanosis or edema.  NEUROLOGIC: Alert and oriented x 3  SKIN: No rashes.  PSYCH: Appropriate affect.    LABORATORY DATA:  Lab Results   Component Value Date    SODIUM 141 01/24/2010    POTASSIUM 3.3 (L) 01/24/2010    CHLORIDE 107 01/24/2010    CO2 27 01/24/2010    ANIONGAP 7 01/24/2010  BUN 10 01/24/2010    CREATININE 0.69 01/24/2010    BUNCRRATIO 14 01/24/2010    GFR >59 01/24/2010    GLUCOSENF 88 01/24/2010     ENDOSCOPIC DATA:  **We do not have these records to confirm the following:  EGD had been attempted in 03/2020, but the procedure was aborted due to the fact she aspirated.      Colonoscopy done in 03/2020, which reportedly showed diverticulosis.    IMAGING, STUDIES, AND OTHER TESTING:  No recent abdominal imaging    IMPRESSION: Ms Hebron is a 45 y.o. female with a history of Chiari-Frommel syndrome, GERD, DM, and HTN who was referred to the [facility] Medicine section of Gastroenterology & Hepatology for evaluation of GERD and hiccups.    RECOMMENDATIONS:   #GERD:  #Reported hiccups:  Ms. Brusca indicated a longstanding history of GERD, which had previously been well controlled on omeprazole 40 mg daily.  She now endorses occasional break through heartburn and ongoing 'hiccups'.  However, she had a few  of these reported hiccups during her office visit today, which appear to be more consistent with a laryngospasm than a hiccup.  ENT previously had performed a flexible laryngoscope and endorsed acid reflux changes along with possible muscle tension.  We will plan to optimize her GERD management and initiate her on Protonix 40 mg twice a day.  Constipation may also be contributing to worsening GERD symptoms.  We discussed appropriate administration of her PPI, which she has not been doing.  We will tentatively plan for an EGD as well due to above symptoms and dysphagia.  We reviewed the risks, benefits, alternatives of the procedure, she agreed to proceed.  Encouraged continued follow up with ENT and Neurosurgery given Chiari Frommel syndrome, unclear if contributing to symptoms.      #Abdominal pain:  Ms. Livigni has ongoing abdominal pain, which is likely multifactorial (constipation/IBS-C, acid mucosal disease, etc).  Based on description of her bowel habits, suspect that she has underlying constipation.  We will monitor for improvement in her abdominal pain with the initiation of an aggressive bowel regimen.  We will also be planning to further optimize her GERD management and monitor for improvement in her abdominal pain.  She reports a history of H pylori in the past and indicated that she was treated with Prevpac.  She is not sure if she had a follow-up H pylori stool antigen to confirm eradication.  Given ongoing symptoms, would be hesitant to stop her PPI to complete an H pylori stool antigen at this time.  We will tentatively plan for gastric biopsies at her upcoming EGD and when symptoms are better controlled we will plan for her to complete H pylori stool antigen off her PPI.  This was discussed with patient.  She understands implications of this.  We will also check liver enzymes, lipase and await upcoming RUQ ultrasound given upper abdominal pain.      #Dysphagia:  Based on description of dysphagia, she  has components of both oropharyngeal and esophageal dysphagia.  She had a recent MBS with SLP, which did not show any aspiration and she was recommended to do a regular diet with thin liquids.  We will plan to further evaluate with a barium swallow with tablet and an EGD.  We reviewed the risks, benefits, alternatives, she agreed to proceed.    #Constipation:  #Rectal bleeding:  #Abdominal bloating:  Based on description of bowel habits, suspect that she has underlying constipation.  We  discussed the different options available for the management of constipation.  After which she elected to initiate MiraLax scheduled and titrated (verbal and written instructions provided).  Prior to which she will use Dulcolax 20 mg x 1 time dose.    She indicated that she has been having intermittent rectal bleeding for years, generally occurring 1-2 times a year.  She reported having a colonoscopy in September 2021, we will obtain these records for review.  Encouraged her to avoid straining and initiate bowel regimen as detailed above given potential that it is related to underlying hemorrhoids.  We will await recent colonoscopy records.  We will also obtain H&H.      Abdominal bloating may be related to constipation, we will monitor for improvement with the initiation of a bowel regimen.  We will also check celiac serologies.      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 Town reported a history of NAFLD.  She reported drinking at maximum 2-3 alcoholic beverages a month.  Given reported history of NAFLD, advised avoidance of alcohol.  We will plan to obtain viral hepatitides and RUQ [place] with elastography to evaluate for fibrosis and confirm diagnosis of NAFLD.  We discussed the need for lifestyle modifications primarily focusing on diet changes and increased activity with a goal weight loss of 10% over the next 6 months.  She was congratulated on her weight loss so far (reported 10 lbs in past 3 months).  We  discussed avoidance of excess sugars in the diet with a focus on avoiding high fructose corn syrup containing products. We also discussed eating more fruits and vegetables and trying to maintain a diet low in fried/fatty foods. In regards to exercise we discussed the importance of at least 30 minutes of aerobic exercise per day but ideally 45 minutes of aerobic exercise daily.  She also discussed the weight loss clinic, she expressed an interest in being seen, referral placed.    #Family history of colon cancer and polyps:  She indicated that her maternal grandmother had colon polyps in her 69s and maternal grandfather had colon cancer in his 41s.   She denies any first-degree family members with colon cancer or colon polyps.  She does not think that she has ever had colon polyps on a colonoscopy.  We will await the results of her upcoming colonoscopy to dictate the appropriate screening/surveillance interval.    Thank you for allowing [place] to provide care for this patient.     On the day of the encounter, a total of  64 minutes was spent on this patient encounter including review of historical information, examination, documentation and post-visit activities.     Keane Scrape, DO, 07/30/2020, 11:13  Assistant Professor of Medicine  Gastroenterology and Hepatology  [facility] / [place]

## 2020-07-30 NOTE — Patient Instructions (Addendum)
It was nice to see you today!    --Initiate Protonix 40 mg twice a day (take on empty stomach, eat 30-60 mins later) and re-evaluate in 2 months to see how you are responding  --Obtain barium swallow with tablet  --Start a bowel regimen.  Please complete Dulcolax 20 mg x 1 time dose (this is four of the 5 mg tablets, these are over the counter), then start Miralax scheduled and titrated.  To do this/ titrate Miralax, please begin by taking Miralax 17 gm (1 scoop) daily.  Continue for 5 days, after which time, assess response.  If at goal (1-2 soft bowel movements daily), continue with current dose/regimen.  However, if after 5 days, not at goal, increase to 34 gm (2 scoops) daily.  This should be taken at the same time (together, not split and taken at separate times of the day).  After an additional 5 days, assess response.  If at goal (1-2 soft bowel movements daily), continue current regimen.  If not a goal, call us for additional instructions.     If at any point you develop diarrhea, do not stop Miralax, but reduce the dose (1/2 scoop or 1 scoop daily).      --Avoid all alcohol use  --Blood work  --Ultrasound  --Work on [employer] loss with diet and exercise.  Referral to weight loss clinic.  --EGD  --Continue to follow with Neurosurgery and ENT

## 2020-07-31 ENCOUNTER — Encounter (INDEPENDENT_AMBULATORY_CARE_PROVIDER_SITE_OTHER): Payer: Self-pay | Admitting: Gastroenterology

## 2020-07-31 LAB — TISSUE TRANSGLUTAMINASE (TTG) ANTIBODY, IGA, SERUM
TISSUE TRANSGLUTAMINASE ANTIBODIES IGA QUALITATIVE: NEGATIVE
TISSUE TRANSGLUTAMINASE ANTIBODIES IGA QUANTITATIVE: <0.5 U/mL (ref <15.0)

## 2020-07-31 LAB — IMMUNOGLOBULIN A (IGA), SERUM: IMMUNOGLOBULIN A (IGA): 242 mg/dL (ref 85–499)

## 2020-08-05 ENCOUNTER — Encounter (INDEPENDENT_AMBULATORY_CARE_PROVIDER_SITE_OTHER): Payer: Self-pay | Admitting: Neurological Surgery

## 2020-08-12 ENCOUNTER — Other Ambulatory Visit (HOSPITAL_BASED_OUTPATIENT_CLINIC_OR_DEPARTMENT_OTHER): Payer: Self-pay

## 2020-08-12 ENCOUNTER — Encounter (INDEPENDENT_AMBULATORY_CARE_PROVIDER_SITE_OTHER): Payer: Self-pay | Admitting: Neurological Surgery

## 2020-08-19 ENCOUNTER — Other Ambulatory Visit (HOSPITAL_COMMUNITY): Payer: Self-pay

## 2020-09-09 ENCOUNTER — Ambulatory Visit: Payer: Medicaid Other | Attending: Adult Health | Admitting: Neurological Surgery

## 2020-09-09 ENCOUNTER — Ambulatory Visit (HOSPITAL_BASED_OUTPATIENT_CLINIC_OR_DEPARTMENT_OTHER)
Admission: RE | Admit: 2020-09-09 | Discharge: 2020-09-09 | Disposition: A | Discharge status: Home / Self Care | Payer: Medicaid Other | Source: Ambulatory Visit

## 2020-09-09 ENCOUNTER — Other Ambulatory Visit: Payer: Self-pay

## 2020-09-09 ENCOUNTER — Encounter (INDEPENDENT_AMBULATORY_CARE_PROVIDER_SITE_OTHER): Payer: Self-pay | Admitting: Neurological Surgery

## 2020-09-09 VITALS — BP 138/69 | HR 84 | Temp 97.0°F | Ht 66.0 in | Wt 313.5 lb

## 2020-09-09 DIAGNOSIS — K760 Fatty (change of) liver, not elsewhere classified: Secondary | ICD-10-CM | POA: Insufficient documentation

## 2020-09-09 DIAGNOSIS — G935 Compression of brain: Secondary | ICD-10-CM | POA: Insufficient documentation

## 2020-09-09 DIAGNOSIS — Z6841 Body Mass Index (BMI) 40.0 and over, adult: Secondary | ICD-10-CM

## 2020-09-09 DIAGNOSIS — R160 Hepatomegaly, not elsewhere classified: Secondary | ICD-10-CM

## 2020-09-09 DIAGNOSIS — H538 Other visual disturbances: Secondary | ICD-10-CM

## 2020-09-09 DIAGNOSIS — R519 Headache, unspecified: Secondary | ICD-10-CM

## 2020-09-09 DIAGNOSIS — R066 Hiccough: Secondary | ICD-10-CM | POA: Insufficient documentation

## 2020-09-09 NOTE — Progress Notes (Signed)
[facility]  Return Outpatient Note      Date:  09/09/2020  Age:  45 y.o.  Referring Physician:   Oley Balm, [address]  [place],  [place] [address]         Subjective:   Chief Complaint:   Chief Complaint   Patient presents with    Chiari I Malformation       HPI: Caitlyn Gomez is a 45 y.o. female with cerebellar lesion discovered during head ache work up. The patient was last in clinic on 05/03/20, reported continued vomiting and hiccups,  blurred vision, left neck tender mass, generalized weakness with weight loss, and intermittent frontal head aches. Chiari not that pronounced, no syrinx - still with flow ventral to stem. Refer to GI for eval at that time. PMH significant for Afib, CAD, HTN, CVA, DM, thyroid nodules,  cervical cancer, fibromyalgia. Currently smokes 1/2 PPD.      Today's visit:  Today the patient is here for 3 month clinical follow up Chiari malformation with no new imaging. The patient presents to the clinic today tearful alone, no family members present. She states she was seen by GI last month and is scheduled for EGD, Dr. Radford Pax for neck mass, and Dr. Lissa Merlin with no papilledema on last exam. Today she reports right side headache 6/10, continued vomiting and hiccups, blurred vision, occasional left neck tenderness, generalized weakness and weight loss. The patient denies dysphagia, syncope, seizures, new weakness or numbness, acute cognitive changes, recent falls or injuries.          Current Outpatient Medications:     apixaban (ELIQUIS) 5 mg Oral Tablet, Take 5 mg by mouth Once a day, Disp: , Rfl:     atorvastatin (LIPITOR) 40 mg Oral Tablet, Take 40 mg by mouth Every evening, Disp: , Rfl:     bethanechol chloride (URECHOLINE) 5 mg Oral Tablet, Take 1 Tablet by mouth Every night, Disp: , Rfl:     chlorproMAZINE (THORAZINE) 25 mg Oral Tablet, Take 1 Tablet by mouth Twice daily, Disp: , Rfl:     citalopram (CELEXA) 20 mg Oral Tablet, Take 1 Tablet (20 mg total)  by mouth Once a day, Disp: 30 Tablet, Rfl: 1    isosorbide mononitrate (IMDUR) 30 mg Oral Tablet Sustained Release 24 hr, Take 30 mg by mouth Every morning, Disp: , Rfl:     metoprolol succinate (TOPROL-XL) 50 mg Oral Tablet Sustained Release 24 hr, Take 1 Tablet by mouth Once a day, Disp: , Rfl:     nitroGLYCERIN (NITROSTAT) 0.3 mg Sublingual Tablet, Sublingual, Place 0.3 mg under the tongue Every 5 minutes as needed for Chest pain for 3 doses over 15 minutes, Disp: , Rfl:     omeprazole (PRILOSEC) 40 mg Oral Capsule, Delayed Release(E.C.), Take 1 Capsule (40 mg total) by mouth Twice daily, Disp: 90 Capsule, Rfl: 4    pantoprazole (PROTONIX) 40 mg Oral Tablet, Delayed Release (E.C.), Take 1 Tablet (40 mg total) by mouth Twice daily Take on empty stomach, eat 30-60 mins later, Disp: 60 Tablet, Rfl: 11    PROAIR HFA 90 mcg/actuation Inhalation HFA Aerosol Inhaler, Take 1 Puff by inhalation Every 4 hours as needed, Disp: , Rfl:     Objective:   Vital Signs:  BP 138/69    Pulse 84    Temp 36.1 C (97 F)    Ht 1.676 m (5\' 6" )    Wt (!) 142 kg (313 lb 7.9 oz)  LMP 09/07/2009    BMI 50.60 kg/m       Examination  Constitutional:Well groomed, in no apparent distress  Eyes: Conjunctiva clear, + red reflex, optic discs and posterior segments - difficult to visualize, no obvious abnormalities;   ENT:  Trachea midline  Musculoskeletal  Gait and Station: slow and steady with no ataxia  Muscle strength (upper extremities): 5/5 bilaterally  Muscle strength (lower extremities): 5/5 bilaterally  Muscle tone (upper extremities): WNL  Muscle tone (lower extremities): WNL  Sensory: Sensory exam in the upper and lower extremities is normal  Coordination: Normal rapid alternating movements and finger to nose intact. No pronator drift.   Neurological  Level of consciousness: Alert and oriented  Recent and remote memory: Good recall and able to follow commands  Attention span and concentration: Normal in  conversation  Language/Speech: No aphasia or dysarthria  Fund of knowledge: Appropriate in this setting  Cranial Nerves  2nd: PERRL  3rd,4th,6th:  EOMI, no nystagmus  5th: Facial sensation intact  8th: Hearing grossly intact  11th: Normal shoulder shrug  Unable to assess remainder of cranial nerves as patient wearing mask for infection prevention purposes      Data reviewed  - There is no new imaging to review for today's visit.    - Previous charts reviewed    Discussions with other providers: Patient history and presentation were discussed with Dr. Bobby Rumpf    Assessment:    ICD-10-CM    1. 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 Blurred vision  H53.8    3. Hiccups  R06.6    4. Headache  R51.9        Recommendations  - [facility] is in agreement with the following plan:    - The natural history, film findings, and indications for treatment were discussed.   - The patient is doing well clinically and her films remain stable per Dr. Bobby Rumpf   - From a neurosurgical perspective, no surgery indicated at this time per Dr. Bobby Rumpf   - RTC in 6 months or sooner if problems develop.   - MRI Brain WO Contrast before next visit in 6 months  - Continue Medical Management (Diet, Exercise, Medication).      - The patient has been advised to follow up with their PCP in regards any chronic medical conditions and any non-neurosurgical symptoms that they may have. A copy of the note from today's clinic appointment will be sent to the patient's PCP Oley Balm, MD on file (confirmed during visit)    The patient was seen as a shared visit with the co-signing faculty.    Neale Burly, APRN 09/09/2020, 11:57    With Dr. Bobby Rumpf    I personally saw and evaluated the patient as part of a shared service with an APP.    My substantive findings are:  MDM (complete) chiari I. Additional investigation into her symptomatology ongoing    I independently of the APP spent a total of (20) minutes in direct/indirect  care of this patient including initial evaluation, review of laboratory, radiology, diagnostic studies, review of medical record, order entry and coordination of care.

## 2020-09-12 ENCOUNTER — Encounter (INDEPENDENT_AMBULATORY_CARE_PROVIDER_SITE_OTHER): Payer: Self-pay | Admitting: Ophthalmology

## 2020-09-12 ENCOUNTER — Ambulatory Visit (HOSPITAL_BASED_OUTPATIENT_CLINIC_OR_DEPARTMENT_OTHER)
Admission: RE | Admit: 2020-09-12 | Discharge: 2020-09-12 | Disposition: A | Discharge status: Home / Self Care | Payer: Medicaid Other | Source: Ambulatory Visit

## 2020-09-12 ENCOUNTER — Ambulatory Visit (HOSPITAL_COMMUNITY): Admit: 2020-09-12 | Discharge: 2020-09-12 | Disposition: A | Discharge status: Home / Self Care | Payer: Medicaid Other

## 2020-09-12 ENCOUNTER — Ambulatory Visit: Payer: Medicaid Other | Attending: Ophthalmology | Admitting: Ophthalmology

## 2020-09-12 ENCOUNTER — Other Ambulatory Visit: Payer: Self-pay

## 2020-09-12 DIAGNOSIS — H538 Other visual disturbances: Secondary | ICD-10-CM

## 2020-09-12 DIAGNOSIS — R1310 Dysphagia, unspecified: Secondary | ICD-10-CM

## 2020-09-12 DIAGNOSIS — H2513 Age-related nuclear cataract, bilateral: Secondary | ICD-10-CM

## 2020-09-12 MED ORDER — BARIUM SULFATE 98 % ORAL POWDER FOR SUSPENSION
120.0000 mL | INHALATION_SUSPENSION | ORAL | Status: AC
Start: 2020-09-12 — End: 2020-09-12
  Administered 2020-09-12: 120 mL via ORAL

## 2020-09-12 MED ORDER — SOD BICARB-CITRIC AC-SIMETH 2.21 GRAM-1.53 GRAM/4 GRAM GRANULES EFFERV
1.0000 | GRANULES | ORAL | Status: AC
Start: 2020-09-12 — End: 2020-09-12
  Administered 2020-09-12: 11:00:00 1 via ORAL

## 2020-09-12 MED ORDER — SOD BICARB-CITRIC AC-SIMETH 2.21 GRAM-1.53 GRAM/4 GRAM GRANULES EFFERV
GRANULES | ORAL | Status: AC
Start: 2020-09-12 — End: 2020-09-12
  Filled 2020-09-12: qty 1

## 2020-09-12 MED ORDER — BARIUM SULFATE 60 % (W/V) ORAL SUSPENSION
90.0000 mL | ORAL | Status: AC
Start: 2020-09-12 — End: 2020-09-12
  Administered 2020-09-12: 90 mL via ORAL

## 2020-09-12 MED ORDER — BARIUM SULFATE 700 MG TABLET
1.0000 | ORAL_TABLET | ORAL | Status: AC
Start: 2020-09-12 — End: 2020-09-12
  Administered 2020-09-12: 11:00:00 1 via ORAL

## 2020-09-12 NOTE — Progress Notes (Addendum)
[facility]  [place] [address]  Operated by [facility]         Patient Name: Caitlyn Gomez  MRN#: [id number]  [place]: 09-27-1975    Date of Service: 09/12/2020    Chief Complaint     Headache          Aiana Nordquist Millman is a 45 y.o. female who presents today for evaluation/consultation of:  HPI     Pt is here for follow up of ACM.  Pt notes that she continues to have blurred vision OU   And headaches a couple times per week.  Pt notes that her eyes are sore to touch at times.  No floaters  No flashes of light   Pt is scheduled for an EGD procedure on 3/8    Last edited by Zettie Cooley, COA on 09/12/2020  9:12 AM. (History)        ROS     Positive for: Constitutional (headaches), Neurological Roselie Awkward Chairi Malformation), Cardiovascular (AFIB , HTN and HIGH Cholesterol), Eyes (blurred vision)    Negative for: Gastrointestinal, Skin, Genitourinary, Musculoskeletal, HENT, Endocrine, Respiratory, Psychiatric, Allergic/Imm, Heme/Lymph    Last edited by Zettie Cooley, COA on 09/12/2020  9:14 AM. (History)         All other systems Negative    Zettie Cooley, COA  09/12/2020, 09:41    Base Eye Exam     Visual Acuity (Snellen - Linear)       Right Left    Dist sc 20/25 -1 20/70    Dist ph sc  20/50 -1          Tonometry (Tonopen, 10:11 AM)       Right Left    Pressure 14 14          Pupils       Pupils APD    Right PERRL None    Left PERRL None          Visual Fields (Counting fingers)       Right Left     Full Full          Extraocular Movement       Right Left     Full Full          Neuro/Psych     Oriented x3: Yes    Mood/Affect: Normal          Dilation     Both eyes: 1.0% Mydriacyl, 2.5% Phenylephrine @ 10:18 AM            Additional Tests     Color       Right Left    Ishihara 14/14 14/14            Slit Lamp and Fundus Exam     External Exam       Right Left    External Normal Normal          Slit Lamp Exam       Right Left    Lids/Lashes  Normal Normal    Conjunctiva/Sclera White and quiet White and quiet    Cornea Clear Clear    Anterior Chamber Deep and quiet Deep and quiet    Iris Round and reactive Round and reactive    Lens Trace Nuclear sclerosis Trace Nuclear sclerosis    Vitreous Normal Normal          Fundus Exam  Right Left    Disc Normal, no pallor, no optic disc edema Normal, no optic disc edema, no pallor    C/D Ratio 0.3 0.3    Macula Normal Normal    Vessels mild narrowing mild narrowing    Periphery Normal Normal            Refraction     Manifest Refraction       Sphere Cylinder Axis Dist VA    Right -1.00 +0.50 105 20/25    Left -0.50 +1.75 070 20/40-2          Final Rx       Sphere Cylinder Axis Dist VA    Right -1.00 +0.50 105 20/25    Left -0.50 +1.75 070 20/40-2                MD Addition to HPI:     Patient here for FU chiari malformation. Patient mentions it is harder to read up close as the vision is blurred. She denies any blackouts. The patient states she always has blurry vision because she doesn't wear her glasses, but mentions it has become slightly more blurred over the past 1-71M. BCVA appears to be improved since the prior visit on today's testing.     She has a hx of a chiari malformation and follows with Dr. Bobby Rumpf, has an MRI coming up.           ENCOUNTER DIAGNOSES     ICD-10-CM   1. Blurred vision  H53.8     Orders Placed This Encounter   Procedures    OPH 3 ISOPTERS VF       Ophthalmic Plan of Care:    1) hx chiari malformation  -no papilledema on exam, crisp symmetric disc margins without pallor   -no dbn  - stable examination      2) refractive error with astigmatism  - No HX of amblyopia or strabismus as a child  -mild ns and astigmatism  - No mis-alignment on exam  -rnfl today  -has limited thorazine use for  hiccups    Follow up:    I have asked Shanaya Schneck to follow up in 21mos            Alden Benjamin, MD  09/12/2020, 10:25      I have seen and examined the above patient. I discussed the above  diagnoses listed in the assessment and the above ophthalmic plan of care with the patient and patient's family. All questions were answered. I reviewed and, when necessary, made changes to the technician/resident note, documented ophthalmology exam, chief complaint, history of present illness, allergies, review of systems, past medical, past surgical, family and social history. I personally reviewed and interpreted all testing and/or imaging performed at this visit and agree with the resident's or fellow's interpretation. Any exceptions/additions are edited/noted in the relevant encounter fields.          I saw and examined the patient.  I reviewed the resident's or fellow's note.  I agree with the findings and plan of care as documented in the resident's or fellow's note.  I personally reviewed and interpreted all testing and/or imaging performed at this visit and agree with the resident's or fellow's interpretation. Any exceptions/additions are edited/noted.     Gwen Her, MD 09/12/2020, 10:40

## 2020-09-13 NOTE — Letter (Signed)
PATIENT NAME: KOBE, JANSMA [place] NUMBER:  [id number]  DATE OF SERVICE: 09/12/2020  DATE OF BIRTH:  Sep 07, 1975      September 12, 2020       [address] [address]   [place], [place] [address]     Dear Ms. Davenport:     I did note that on your visual field test that you had evidence of some  constriction.  Because of this finding and the fact that you have used some Thorazine in the past, I am going to recommend that you see my retina specialist at [facility] in followup.  I would like you to see Dr Stevenson Clinch at the [facility] in 6 weeks. I tried to reach you by telephone but was not successful.     Sincerely,        Gwen Her, MD  Associate Professor; Director, [place] Department of Ophthalmology               CC:   [facility]   [place], [place] [address]       DD:  09/12/2020 16:49:21  DT:  09/13/2020 08:00:55 TP  D#:  [id number]

## 2020-09-14 ENCOUNTER — Encounter (HOSPITAL_COMMUNITY): Payer: Self-pay

## 2020-09-16 ENCOUNTER — Encounter (HOSPITAL_COMMUNITY): Payer: Self-pay

## 2020-09-17 ENCOUNTER — Encounter (HOSPITAL_COMMUNITY): Payer: Self-pay

## 2020-09-17 ENCOUNTER — Inpatient Hospital Stay (HOSPITAL_COMMUNITY)
Admission: RE | Admit: 2020-09-17 | Discharge: 2020-09-17 | Disposition: A | Discharge status: Home / Self Care | Payer: Medicaid Other | Source: Ambulatory Visit

## 2020-09-17 ENCOUNTER — Other Ambulatory Visit: Payer: Self-pay

## 2020-09-17 HISTORY — DX: Chronic obstructive pulmonary disease, unspecified: J44.9

## 2020-09-17 HISTORY — DX: Hyperlipidemia, unspecified: E78.5

## 2020-09-17 HISTORY — DX: Cardiac arrhythmia, unspecified: I49.9

## 2020-09-17 HISTORY — DX: Personal history of other specified conditions: Z87.898

## 2020-09-17 HISTORY — DX: Disorder of thyroid, unspecified: E07.9

## 2020-09-17 HISTORY — DX: Malignant (primary) neoplasm, unspecified: C80.1

## 2020-09-17 HISTORY — DX: Shortness of breath: R06.02

## 2020-09-24 ENCOUNTER — Ambulatory Visit (HOSPITAL_BASED_OUTPATIENT_CLINIC_OR_DEPARTMENT_OTHER): Payer: Medicaid Other | Admitting: Student in an Organized Health Care Education/Training Program

## 2020-09-24 ENCOUNTER — Ambulatory Visit (HOSPITAL_COMMUNITY): Payer: Medicaid Other | Admitting: Gastroenterology

## 2020-09-24 ENCOUNTER — Encounter (HOSPITAL_COMMUNITY): Payer: Self-pay | Admitting: Gastroenterology

## 2020-09-24 ENCOUNTER — Other Ambulatory Visit: Payer: Self-pay

## 2020-09-24 ENCOUNTER — Ambulatory Visit
Admission: RE | Admit: 2020-09-24 | Discharge: 2020-09-24 | Disposition: A | Discharge status: Home / Self Care | Payer: Medicaid Other | Source: Ambulatory Visit | Attending: Gastroenterology | Admitting: Gastroenterology

## 2020-09-24 ENCOUNTER — Encounter (INDEPENDENT_AMBULATORY_CARE_PROVIDER_SITE_OTHER): Payer: Self-pay | Admitting: Gastroenterology

## 2020-09-24 ENCOUNTER — Ambulatory Visit (HOSPITAL_COMMUNITY): Payer: Medicaid Other | Admitting: Student in an Organized Health Care Education/Training Program

## 2020-09-24 ENCOUNTER — Encounter (HOSPITAL_COMMUNITY)
Admission: RE | Disposition: A | Discharge status: Home / Self Care | Payer: Self-pay | Source: Ambulatory Visit | Attending: Gastroenterology

## 2020-09-24 DIAGNOSIS — K3189 Other diseases of stomach and duodenum: Secondary | ICD-10-CM

## 2020-09-24 DIAGNOSIS — R1310 Dysphagia, unspecified: Secondary | ICD-10-CM | POA: Insufficient documentation

## 2020-09-24 DIAGNOSIS — K2100 Gastro-esophageal reflux disease with esophagitis, without bleeding: Secondary | ICD-10-CM | POA: Insufficient documentation

## 2020-09-24 DIAGNOSIS — K449 Diaphragmatic hernia without obstruction or gangrene: Secondary | ICD-10-CM

## 2020-09-24 DIAGNOSIS — R109 Unspecified abdominal pain: Secondary | ICD-10-CM

## 2020-09-24 DIAGNOSIS — K2270 Barrett's esophagus without dysplasia: Secondary | ICD-10-CM

## 2020-09-24 DIAGNOSIS — K2950 Unspecified chronic gastritis without bleeding: Secondary | ICD-10-CM

## 2020-09-24 SURGERY — GASTROSCOPY
Anesthesia: General | Site: Mouth | Wound class: Clean Contaminated Wounds-The respiratory, GI, Genital, or urinary

## 2020-09-24 MED ORDER — SODIUM CHLORIDE 0.9 % (FLUSH) INJECTION SYRINGE
2.0000 mL | INJECTION | INTRAMUSCULAR | Status: DC | PRN
Start: 2020-09-24 — End: 2020-09-24

## 2020-09-24 MED ORDER — PHENYLEPHRINE 1 MG/10 ML (100 MCG/ML) IN 0.9 % SOD.CHLORIDE IV SYRINGE
INJECTION | Freq: Once | INTRAVENOUS | Status: DC | PRN
Start: 2020-09-24 — End: 2020-09-24
  Administered 2020-09-24: 50 ug via INTRAVENOUS
  Administered 2020-09-24: 100 ug via INTRAVENOUS

## 2020-09-24 MED ORDER — LACTATED RINGERS INTRAVENOUS SOLUTION
INTRAVENOUS | Status: DC
Start: 2020-09-24 — End: 2020-09-24

## 2020-09-24 MED ORDER — MIDAZOLAM (PF) 1 MG/ML INJECTION SOLUTION
Freq: Once | INTRAMUSCULAR | Status: DC | PRN
Start: 2020-09-24 — End: 2020-09-24
  Administered 2020-09-24: 2 mg via INTRAVENOUS

## 2020-09-24 MED ORDER — MIDAZOLAM 1 MG/ML INJECTION SOLUTION
INTRAMUSCULAR | Status: AC
Start: 2020-09-24 — End: 2020-09-24
  Filled 2020-09-24: qty 2

## 2020-09-24 MED ORDER — LACTATED RINGERS INTRAVENOUS SOLUTION
INTRAVENOUS | Status: DC | PRN
Start: 2020-09-24 — End: 2020-09-24

## 2020-09-24 MED ORDER — DEXAMETHASONE SODIUM PHOSPHATE 4 MG/ML INJECTION SOLUTION
Freq: Once | INTRAMUSCULAR | Status: DC | PRN
Start: 2020-09-24 — End: 2020-09-24
  Administered 2020-09-24: 4 mg via INTRAVENOUS

## 2020-09-24 MED ORDER — SODIUM CHLORIDE 0.9 % (FLUSH) INJECTION SYRINGE
2.0000 mL | INJECTION | Freq: Three times a day (TID) | INTRAMUSCULAR | Status: DC
Start: 2020-09-24 — End: 2020-09-24

## 2020-09-24 MED ORDER — EPHEDRINE SULFATE 50 MG/ML INTRAVENOUS SOLUTION
Freq: Once | INTRAVENOUS | Status: DC | PRN
Start: 2020-09-24 — End: 2020-09-24
  Administered 2020-09-24: 5 mg via INTRAVENOUS

## 2020-09-24 MED ORDER — SUCCINYLCHOLINE(PF)200 MG/10 ML(20 MG/ML)-NACL,ISO INTRAVENOUS SYRINGE
INJECTION | Freq: Once | INTRAVENOUS | Status: DC | PRN
Start: 2020-09-24 — End: 2020-09-24
  Administered 2020-09-24: 140 mg via INTRAVENOUS

## 2020-09-24 MED ORDER — ALBUTEROL SULFATE HFA 90 MCG/ACTUATION AEROSOL INHALER - RN
Freq: Once | RESPIRATORY_TRACT | Status: DC | PRN
Start: 2020-09-24 — End: 2020-09-24
  Administered 2020-09-24 (×2): 6 via RESPIRATORY_TRACT

## 2020-09-24 MED ORDER — ONDANSETRON HCL (PF) 4 MG/2 ML INJECTION SOLUTION
Freq: Once | INTRAMUSCULAR | Status: DC | PRN
Start: 2020-09-24 — End: 2020-09-24
  Administered 2020-09-24: 4 mg via INTRAVENOUS

## 2020-09-24 MED ORDER — PROPOFOL 10 MG/ML IV BOLUS
INJECTION | Freq: Once | INTRAVENOUS | Status: DC | PRN
Start: 2020-09-24 — End: 2020-09-24
  Administered 2020-09-24: 200 mg via INTRAVENOUS

## 2020-09-24 MED ORDER — LIDOCAINE (PF) 100 MG/5 ML (2 %) INTRAVENOUS SYRINGE
INJECTION | Freq: Once | INTRAVENOUS | Status: DC | PRN
Start: 2020-09-24 — End: 2020-09-24
  Administered 2020-09-24: 100 mg via INTRAVENOUS

## 2020-09-24 SURGICAL SUPPLY — 4 items
FORCEPS BIOPSY NEEDLE 240CM 2. 2MM RJ 4 2.8MM STD CPC STRL (INSTRUMENTS) ×1
FORCEPS BIOPSY NEEDLE 240CM 2.2MM RJ 4 2.8MM STD CPC STRL DISP ORNG (SURGICAL INSTRUMENTS) ×1 IMPLANT
KIT ENDO (GENE) ×1
KIT ENDOS CMPLN ENDOKIT ORCAPOD 4 1.1OZ (GENE) ×1 IMPLANT

## 2020-09-24 NOTE — H&P (Signed)
[facility]   GI Admission History and Physical      Caitlyn Gomez, Caitlyn Gomez   MRN:  [id number]  Date of Birth:  [birth year]    Date of Procedure:  09/24/2020    Chief Complaint: Dysphagia; Abdominal pain; Hiccups    HPI: Caitlyn, Gomez is a 45 y.o. year old female who presents today for EGD.    This procedure is being done to evaluate Dysphagia; Abdominal pain; Hiccups.    The patient denies hematochezia or melena.    Dietary status: No solid food in preceding 6 hours and no clear liquids in preceding 2 hours    Past Medical History:   Diagnosis Date    A-fib (CMS [facility])     Dr. Chauncey Cruel. Rana    Abnormal Pap smear     Anxiety     Arthropathy     Asthma     Cancer (CMS [facility])     ovaries    Chronic obstructive airway disease (CMS [facility])     Coronary artery disease     Depression     Diabetes     Dysplasia of cervix     Dysrhythmias     a fib    Ectopic pregnancy     Fibromyalgia     GERD (gastroesophageal reflux disease)     controlled with med    History of anesthesia complications     aspirated for scope last time    HTN (hypertension)     Hyperlipidemia     Kidney disease     NAFLD (nonalcoholic fatty liver disease)     Neck mass     PCOS (polycystic ovarian syndrome)     Psychiatric problem     Shortness of breath     with exertion    Stroke (CMS [facility])     TIA- [place] - 2016 - (left facial droop and left arm weakness)    Thyroid disorder     nodule    Thyroid nodule     Vaginal infection     Varicosities          Reviewed    Allergies   Allergen Reactions    Amoxicillin     Sulfa (Sulfonamides)      Reviewed    Medications Prior to Admission     Prescriptions    apixaban (ELIQUIS) 5 mg Oral Tablet    Take 5 mg by mouth Once a day    atorvastatin (LIPITOR) 40 mg Oral Tablet    Take 40 mg by mouth Every evening    bethanechol chloride (URECHOLINE) 5 mg Oral Tablet    Take 1 Tablet by mouth Every night    chlorproMAZINE (THORAZINE) 25 mg Oral Tablet    Take 1 Tablet by mouth Twice  daily    citalopram (CELEXA) 20 mg Oral Tablet    Take 1 Tablet (20 mg total) by mouth Once a day    isosorbide mononitrate (IMDUR) 30 mg Oral Tablet Sustained Release 24 hr    Take 30 mg by mouth Every morning    metoprolol succinate (TOPROL-XL) 50 mg Oral Tablet Sustained Release 24 hr    Take 1 Tablet by mouth Once a day    nitroGLYCERIN (NITROSTAT) 0.3 mg Sublingual Tablet, Sublingual    Place 0.3 mg under the tongue Every 5 minutes as needed for Chest pain for 3 doses over 15 minutes    omeprazole (PRILOSEC) 40 mg Oral Capsule, Delayed Release(E.C.)  Take 1 Capsule (40 mg total) by mouth Twice daily    pantoprazole (PROTONIX) 40 mg Oral Tablet, Delayed Release (E.C.)    Take 1 Tablet (40 mg total) by mouth Twice daily Take on empty stomach, eat 30-60 mins later    PROAIR HFA 90 mcg/actuation Inhalation HFA Aerosol Inhaler    Take 1 Puff by inhalation Every 4 hours as needed         Reviewed    Past Surgical History:   Procedure Laterality Date    HX APPENDECTOMY      HX DILATION AND CURETTAGE      HX ENDOMETRIAL ABLATION      HX HEART CATHETERIZATION      stent    HX HYSTERECTOMY      HX TONSILLECTOMY      HX TUBAL LIGATION           Reviewed    VITAL SIGNS: BP 122/86    Pulse 63    Temp 36.4 C (97.5 F)    Resp 17    Ht 1.695 m (5' 6.73")    Wt (!) 141 kg (310 lb 6.5 oz)    LMP 09/07/2009    SpO2 100%    BMI 49.01 kg/m     ,Body mass index is 49.01 kg/m.  GENERAL: Alert, oriented, cooperative and no distress noted   HEENT: Normocephalic, the oral mucosa had no erythema and no buccal mucosal lesions. Dentition was intact. The neck is supple.  CARDIOVASCULAR: Normal rate and regular rhythm   PULMONARY: Clear to auscultation, with good air entry throughout. No wheezes, rales or rhonchi.  ABDOMEN: Normal bowel sounds. Soft, nondistended, nontender. No rebound or guarding.   EXTREMITIES: Normal and without clubbing, cyanosis or edema.  NEUROLOGIC: Alert and oriented x 3  SKIN: No rashes.  PSYCH:  Appropriate affect.    Assessment:  Dysphagia; Abdominal pain; Hiccups    Plan:  Proceed with EGD.     Orders Placed This Encounter    [place] IV ACCESS    PERIPHERAL IV DRESSING CHANGE    AND Linked Order Group     NS flush syringe     NS flush syringe    LR premix infusion       To comply with the [place] [employer] and Accreditation's mandate for medication reconciliation, the process of identifying the most accurate list of all medications the patient is taking was done by nursing staff through patient's self reported medication usage. While I attest to the medication reconciliation being done, my acknowledgement doesn't in anyway convey or affirm that I am the prescriber of these medications and can't attest to the safety, efficacy or interactions of these medications, and that responsibility rests solely with the prescribing physician    Keane Scrape, DO, 09/24/2020, 14:58  Assistant Professor of Medicine  Gastroenterology and Hepatology  [facility] / [place]

## 2020-09-24 NOTE — Anesthesia Transfer of Care (Signed)
ANESTHESIA TRANSFER OF CARE   Caitlyn Gomez is a 45 y.o. ,female, Weight: (!) 141 kg (310 lb 6.5 oz)   had Procedure(s):  GASTROSCOPY  GASTROSCOPY WITH BIOPSY  performed  09/24/20   Primary Service: Keane Scrape, DO    Past Medical History:   Diagnosis Date    A-fib (CMS [facility])     Dr. Chauncey Cruel. [facility]    Abnormal Pap smear     Anxiety     Arthropathy     Asthma     Cancer (CMS [place])     ovaries    Chronic obstructive airway disease (CMS [place])     Coronary artery disease     Depression     Diabetes     Dysplasia of cervix     Dysrhythmias     a fib    Ectopic pregnancy     Fibromyalgia     GERD (gastroesophageal reflux disease)     controlled with med    History of anesthesia complications     aspirated for scope last time    HTN (hypertension)     Hyperlipidemia     Kidney disease     NAFLD (nonalcoholic fatty liver disease)     Neck mass     PCOS (polycystic ovarian syndrome)     Psychiatric problem     Shortness of breath     with exertion    Stroke (CMS [facility])     TIA- [place] - 2016 - (left facial droop and left arm weakness)    Thyroid disorder     nodule    Thyroid nodule     Vaginal infection     Varicosities       Allergy History as of 09/24/20     SULFA (SULFONAMIDES)       Noted Status Severity Type Reaction    01/24/10 0300 Haddix, Natalie K 01/24/10 Active             AMOXICILLIN       Noted Status Severity Type Reaction    01/24/10 0301 Haddix, Renaye Rakers 01/24/10 Active                 I completed my transfer of care / handoff to the receiving personnel during which we discussed:  Access, Airway, All key/critical aspects of case discussed, Analgesia, Antibiotics, Expectation of post procedure, Fluids/Product, Gave opportunity for questions and acknowledgement of understanding, Labs and PMHx    Post Location: PACU                                          Additional Info:Patient transported to PACU on 6LO2 simple face mask. Airway patent. Patient following commands. Report given to  RN.                        Last OR Temp: Temperature: 36.4 C (97.5 F)  ABG:  POTASSIUM   Date Value Ref Range Status   01/24/2010 3.3 (L) 3.5 - 5.1 mmol/L Final     CALCIUM   Date Value Ref Range Status   01/24/2010 8.6 8.5 - 10.4 mg/dL Final     Airway:* No LDAs found *  Blood pressure 110/78, pulse 82, temperature 36.4 C (97.5 F), resp. rate 19, height 1.695 m (5' 6.73"), weight (!) 141 kg (310 lb 6.5 oz), last menstrual period  09/07/2009, SpO2 99 %.

## 2020-09-24 NOTE — OR Nursing (Signed)
Abdomen large soft and non tender pre procedure.

## 2020-09-24 NOTE — Anesthesia Preprocedure Evaluation (Signed)
ANESTHESIA PRE-OP EVALUATION  Planned Procedure: GASTROSCOPY (N/A Mouth)  Review of Systems         patient summary reviewed  nursing notes reviewed        Pulmonary   COPD, asthma and shortness of breath,   Cardiovascular    Hypertension and CAD ,No peripheral edema,        GI/Hepatic/Renal    GERD     Endo/Other          Neuro/Psych/MS        Cancer                     Physical Assessment      Airway       Mallampati: III    TM distance: >3 FB    Neck ROM: full  Mouth Opening: fair.  No Facial hair          Dental           (+) edentulous           Pulmonary      (+) decreased breath sounds present and wheezes present        Cardiovascular    Rhythm: regular  Rate: Normal  (-) no friction rub, carotid bruit is not present, no peripheral edema and no murmur     Other findings            Plan  ASA 4     Planned anesthesia type: general     general anesthesia with endotracheal tube intubation              Intravenous induction     Anesthesia issues/risks discussed are: PONV, Post-op Intubation/Ventilation, Cardiac Events/MI, Aspiration, Post-op Cognitive Dysfunction, Blood Loss, Stroke, Intraoperative Awareness/ Recall, Difficult Airway and Sore Throat.  Anesthetic plan and risks discussed with patient.          Patient's NPO status is appropriate for Anesthesia.           (PT reportedly aspirated at outside hospital on sedation attempt    Will proceed with GETA for many RF)

## 2020-09-24 NOTE — Discharge Instructions (Signed)
Instructions for Upper Endoscopy:  Patient Information:  No driving until tomorrow, You may experience a sore throat and gas or bloating for 24 hours, Use an antacid for mild discomfort and Call in one week for biopsy results- Referring MD   REMEMBER TO:  Call your physician for any of the following symptoms:  1.) Persistent vomiting or vomiting bright red blood  2.) Fever (temperature greater than 100F)  3.) Any difficulty breathing.  SURGICAL DISCHARGE INSTRUCTIONS     Dr. Conley Canal, Urban Gibson, DO  performed your GASTROSCOPY, GASTROSCOPY WITH BIOPSY today at the [facility]:  Monday through Friday from 6 a.m. - 7 p.m.: [phone]  Between 7 p.m. - 6 a.m., weekends and holidays:  Call [facility] at [phone] or [phone].    PLEASE SEE WRITTEN HANDOUTS AS DISCUSSED BY YOUR NURSE:      SIGNS AND SYMPTOMS OF A WOUND / INCISION INFECTION   Be sure to watch for the following:   Increase in redness or red streaks near or around the wound or incision.   Increase in pain that is intense or severe and cannot be relieved by the pain medication that your doctor has given you.   Increase in swelling that cannot be relieved by elevation of a body part, or by applying ice, if permitted.   Increase in drainage, or if yellow / green in color and smells bad. This could be on a dressing or a cast.   Increase in fever for longer than 24 hours, or an increase that is higher than 101 degrees Fahrenheit (normal body temperature is 98 degrees Fahrenheit). The incision may feel warm to the touch.    **CALL YOUR DOCTOR IF ONE OR MORE OF THESE SIGNS / SYMPTOMS SHOULD OCCUR.    ANESTHESIA INFORMATION   ANESTHESIA -- ADULT PATIENTS:  You have received intravenous sedation / general anesthesia, and you may feel drowsy and light-headed for several hours. You may even experience some forgetfulness of the procedure. DO NOT DRIVE A MOTOR VEHICLE or perform any activity requiring complete  alertness or coordination until you feel fully awake in about 24-48 hours. Do not drink alcoholic beverages for at least 24 hours. Do not stay alone, you must have a responsible adult available to be with you. You may also experience a dry mouth or nausea for 24 hours. This is a normal side effect and will disappear as the effects of the medication wear off.    REMEMBER   If you experience any difficulty breathing, chest pain, bleeding that you feel is excessive, persistent nausea or vomiting or for any other concerns:  Call your physician Dr. Conley Canal at [phone] or [phone]. You may also ask to have the GI doctor on call paged. They are available to you 24 hours a day.    SPECIAL INSTRUCTIONS / COMMENTS       FOLLOW-UP APPOINTMENTS   Please call patient services at [phone] or [phone] to schedule a date / time of return. They are open Monday - Friday from 7:30 am - 5:00 pm.

## 2020-09-24 NOTE — OR Nursing (Signed)
Abdomen remains soft and pt. tolerates procedure well.

## 2020-09-24 NOTE — Progress Notes (Signed)
I reviewed with Caitlyn Gomez the results of her barium swallow along with RUQ [place] with elastography.  Barium swallow was normal.  We discussed that RUQ [place] with elastography showed hepatomegaly with hepatic steatosis, liver elastography median value of 6.61 kPa (F1).  She was STRONGLY encouraged to continue to work on weight loss with goal of 10% total body weight loss in the next 6 months.  Re-discussed avoidance of all alcohol use.  All of her questions and concerns were addressed.      Keane Scrape DO  Gastroenterology and Hepatology

## 2020-09-25 NOTE — Anesthesia Postprocedure Evaluation (Signed)
Anesthesia Post Op Evaluation    Patient: Caitlyn Gomez  Procedure(s):  GASTROSCOPY  GASTROSCOPY WITH BIOPSY    Last Vitals:Temperature: 36.4 C (97.5 F) (09/24/20 1745)  Heart Rate: 76 (09/24/20 1745)  BP (Non-Invasive): 123/61 (09/24/20 1745)  Respiratory Rate: 16 (09/24/20 1745)  SpO2: 99 % (09/24/20 3536)    No complications documented.    Patient is sufficiently recovered from the effects of anesthesia to participate in the evaluation and has returned to their pre-procedure level.  Patient location during evaluation: PACU       Patient participation: complete - patient participated  Level of consciousness: awake and alert and responsive to verbal stimuli    Pain management: adequate  Airway patency: patent    Anesthetic complications: no  Cardiovascular status: acceptable  Respiratory status: acceptable  Hydration status: acceptable  Patient post-procedure temperature: Pt Normothermic   PONV Status: Absent

## 2020-09-27 LAB — SURGICAL PATHOLOGY SPECIMEN

## 2020-10-15 ENCOUNTER — Ambulatory Visit: Payer: Medicaid Other | Attending: Ophthalmology | Admitting: Ophthalmology

## 2020-10-15 ENCOUNTER — Other Ambulatory Visit: Payer: Self-pay

## 2020-10-15 ENCOUNTER — Ambulatory Visit (HOSPITAL_BASED_OUTPATIENT_CLINIC_OR_DEPARTMENT_OTHER): Admit: 2020-10-15 | Discharge: 2020-10-15 | Disposition: A | Discharge status: Home / Self Care | Payer: Medicaid Other

## 2020-10-15 ENCOUNTER — Encounter (INDEPENDENT_AMBULATORY_CARE_PROVIDER_SITE_OTHER): Payer: Self-pay | Admitting: Ophthalmology

## 2020-10-15 ENCOUNTER — Ambulatory Visit (HOSPITAL_COMMUNITY): Admit: 2020-10-15 | Discharge: 2020-10-15 | Disposition: A | Discharge status: Home / Self Care | Payer: Medicaid Other

## 2020-10-15 DIAGNOSIS — Z79899 Other long term (current) drug therapy: Secondary | ICD-10-CM | POA: Insufficient documentation

## 2020-10-15 DIAGNOSIS — R519 Headache, unspecified: Secondary | ICD-10-CM

## 2020-10-15 DIAGNOSIS — E119 Type 2 diabetes mellitus without complications: Secondary | ICD-10-CM

## 2020-10-15 NOTE — Progress Notes (Addendum)
[facility]  [place] [address]  Operated by [place]         Patient Name: Caitlyn Gomez  MRN#: [id number]  Birthdate: 1975-09-03    Date of Service: 10/15/2020    Chief Complaint     Routine Medication Monitoring; Follow Up - Retina Problem          Ileana Chalupa is a 45 y.o. female who presents today for evaluation/consultation of:  HPI     Routine Medication Monitoring      Additional comments: Hx of taking Torazine, here per Lissa Merlin              Comments     new pt for dr. Stevenson Clinch referred to her by dr. Lissa Merlin   pt states she is here for fluid or swelling behind her eye.   pt states she just got new glasses a few weeks ago and still adjusting to them.   pt states she has been having twitching in her eyes   pt states the other day os had a bunch of floaters. this was something new.   pt denies eye pain or fol.   no eye gtts.   no dm.             Last edited by Lorri Frederick, MD on 10/15/2020 12:04 PM. (History)        ROS     Positive for: Gastrointestinal, Neurological, Endocrine, Eyes (new patient)    Last edited by Lorri Frederick, MD on 10/15/2020 12:04 PM. (History)         All other systems Negative    Modena Jansky, Boyd  10/15/2020, 08:44    Base Eye Exam     Visual Acuity (Snellen - Linear)       Right Left    Dist cc 20/25 -1 20/40 -1    Dist ph cc  NI    Correction: Glasses          Tonometry (Tonopen, 8:55 AM)       Right Left    Pressure 14 15          Pupils       APD    Right None    Left None          Visual Fields (Counting fingers)       Right Left     Full Full          Extraocular Movement       Right Left     Full Full          Neuro/Psych     Oriented x3: Yes    Mood/Affect: Normal          Dilation     Both eyes: 1.0% Mydriacyl, 2.5% Phenylephrine @ 8:55 AM            Slit Lamp and Fundus Exam     External Exam       Right Left    External Normal Normal          Slit Lamp Exam       Right Left    Lids/Lashes Normal Normal     Conjunctiva/Sclera White and quiet White and quiet    Cornea Clear Clear    Anterior Chamber Deep and quiet Deep and quiet    Iris Round and reactive Round and reactive    Lens Trace Nuclear sclerosis Trace Nuclear sclerosis  Vitreous Normal Normal          Fundus Exam       Right Left    Disc Normal, no pallor, no optic disc edema Normal, no optic disc edema, no pallor    C/D Ratio 0.3 0.3    Macula Flat, single drusen ST flat     Vessels mild narrowing mild narrowing    Periphery reticular peripheral pigmentary changes  Pigmentary changes nasally, flat            Refraction     Wearing Rx       Sphere Cylinder Axis    Right -1.00 +0.50 111    Left -0.50 +1.50 066    Age: 3 weeks     Type: SVL                MD Addition to HPI: Patient here for evaluation of decreased vision OS. Reports she is having trouble reading ticket numbers at work and other fine print. Denies any other vision changes aside from blurriness.  Has about 50 logistic spasms a day, dr Conley Canal EGD, swallow tests, has been on  phenothiazine  (Thorazine for hiccups) .Takes about 35 pills a day , this psychotropic drug for 2 yrs and was DC at last visit with Lissa Merlin . Was on nitroglycerin.   Celina mother and grandfather had AMD she believes, had to see retina specialist. Had glaucoma as well.     ENCOUNTER DIAGNOSES     ICD-10-CM   1. Headache  R51.9   2. Long-term use of high-risk medication  Z79.899   3. Diabetes mellitus (CMS [place])  E11.9     Orders Placed This Encounter   Procedures   . 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 I discussed the above diagnoses listed in the assessment and the above ophthalmic plan of care with the patient and patient's family. All questions were answered. I reviewed and, when necessary, made changes to the technician/resident note, documented ophthalmology exam, chief complaint, history of present illness, allergies, review of systems, past medical, past surgical, family and social history. I personally reviewed and interpreted all testing and/or imaging performed at this visit and agree with the resident's or fellow's interpretation. Any exceptions/additions are edited/noted in the relevant encounter fields.    I saw and examined the patient.  I reviewed the resident's or fellow's note.  I agree with the findings and plan of care as documented in the resident's or fellow's note.  I personally reviewed and interpreted all testing and/or imaging performed at this visit and agree with the resident's or fellow's interpretation. Any exceptions/additions are edited/noted.     Lorri Frederick, MD 10/15/2020, 12:05

## 2020-11-14 ENCOUNTER — Encounter (INDEPENDENT_AMBULATORY_CARE_PROVIDER_SITE_OTHER): Payer: Self-pay | Admitting: Gastroenterology

## 2021-01-10 ENCOUNTER — Ambulatory Visit (INDEPENDENT_AMBULATORY_CARE_PROVIDER_SITE_OTHER): Payer: Self-pay | Admitting: Ophthalmology

## 2021-01-30 ENCOUNTER — Encounter (INDEPENDENT_AMBULATORY_CARE_PROVIDER_SITE_OTHER): Payer: Self-pay | Admitting: Gastroenterology

## 2021-03-06 ENCOUNTER — Ambulatory Visit (INDEPENDENT_AMBULATORY_CARE_PROVIDER_SITE_OTHER): Payer: Self-pay | Admitting: Internal Medicine

## 2021-03-10 ENCOUNTER — Ambulatory Visit (INDEPENDENT_AMBULATORY_CARE_PROVIDER_SITE_OTHER)
Admission: RE | Admit: 2021-03-10 | Discharge: 2021-03-10 | Disposition: A | Discharge status: Home / Self Care | Payer: Medicaid Other | Source: Ambulatory Visit

## 2021-03-10 ENCOUNTER — Ambulatory Visit: Payer: Medicaid Other | Attending: Physician Assistant | Admitting: Neurological Surgery

## 2021-03-10 ENCOUNTER — Encounter (INDEPENDENT_AMBULATORY_CARE_PROVIDER_SITE_OTHER): Payer: Self-pay | Admitting: Neurological Surgery

## 2021-03-10 ENCOUNTER — Other Ambulatory Visit: Payer: Self-pay

## 2021-03-10 VITALS — BP 138/86 | HR 73 | Temp 97.0°F | Ht 66.0 in | Wt 322.5 lb

## 2021-03-10 DIAGNOSIS — G935 Compression of brain: Secondary | ICD-10-CM | POA: Insufficient documentation

## 2021-03-10 DIAGNOSIS — Z6841 Body Mass Index (BMI) 40.0 and over, adult: Secondary | ICD-10-CM

## 2021-03-10 DIAGNOSIS — R066 Hiccough: Secondary | ICD-10-CM | POA: Insufficient documentation

## 2021-03-10 NOTE — Progress Notes (Signed)
NEUROSURGERY, PHYSICIAN OFFICE CENTER  [place] [address]  Operated by [place]  Progress Note    Name: Caitlyn Gomez MRN:  [id number]   Date: 03/10/2021 Age: 45 y.o.       Referring Provider:   No referring provider defined for this encounter.          Subjective:   45 y.o. female with chiari discovered during head ache work up. The patient was last in clinic on in 2/22.  She feels things have worsened with posterior headaches, neck pain (edema on the left side of her neck), numbness in the arms, balance issues (veers to the left side) and fatigue.     She recently had a fall (Saturday tripped) and injured her left arm (plans to call her orthopedist after today's visit).  She is wearing a sling. She also has continued hiccupping/arrested breath and is asking to see a pulmonologist.     She takes tylenol with minimal relief. She denies an increase in symptoms with valsalva.    Objective:   Vital Signs:  BP 138/86   Pulse 73   Temp 36.1 C (97 F)   Ht 1.676 m ('5\' 6"'$ )   Wt (!) 146 kg (322 lb 8.5 oz)   LMP 09/07/2009   BMI 52.06 kg/m       Constitutional  General appearance: Normal  Eyes: Ophthalmic exam of optic discs and posterior segments: Normal  Cardiovascular:   Pulses pt 2+  Musculoskeletal  Gait and Station: : Normal  Muscle strength (upper extremities): : Normal  Muscle strength (lower extremities): : Normal  Muscle tone (upper extremities): : Normal  Muscle tone (lower extremities): : Normal  Sensation: Normal  Coordination: Normal  Ankle clonus:Left : Not present Right Not present  Neurological  Orientation: Normal  Recent and remote memory: Normal  Attention span and concentration: Normal  Language: Normal  Fund of knowledge: Normal    Cranial Nerves  2nd: Normal  3rd,4th,6th: Normal  5th: Normal  7th: Normal  8th: Normal  9th: Normal  11th: Normal  12th: Normal      Data reviewed    1. MRI of the Brain performed on 03/10/2021 on [facility] PACS and it shows  unchanged chiari malformation without syrinx.     Discussions with other providers:     Assessment:    1. 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 Bobby Rumpf. Will arrange a pulmonary eval at patient's request for hiccup/breathing arrest.  The patient was seen as a shared visit with the co-signing faculty.  Barrie Folk, PA-C    I personally saw and evaluated the patient as part of a shared service with an APP.    My substantive findings are:  MDM (complete) stable small Chiari, no syrinx. Plan as above    I independently of the APP spent a total of (20) minutes in direct/indirect care of this patient including initial evaluation, review of laboratory, radiology, diagnostic studies, review of medical record, order entry and coordination of care.

## 2021-04-30 ENCOUNTER — Ambulatory Visit
Payer: Medicaid Other | Attending: Physician Assistant | Admitting: Student in an Organized Health Care Education/Training Program

## 2021-04-30 ENCOUNTER — Other Ambulatory Visit: Payer: Self-pay

## 2021-04-30 ENCOUNTER — Encounter (INDEPENDENT_AMBULATORY_CARE_PROVIDER_SITE_OTHER): Payer: Self-pay | Admitting: Student in an Organized Health Care Education/Training Program

## 2021-04-30 VITALS — BP 130/85 | HR 80 | Temp 97.4°F | Ht 66.0 in | Wt 321.3 lb

## 2021-04-30 DIAGNOSIS — J9801 Acute bronchospasm: Secondary | ICD-10-CM | POA: Insufficient documentation

## 2021-04-30 DIAGNOSIS — G935 Compression of brain: Secondary | ICD-10-CM | POA: Insufficient documentation

## 2021-04-30 DIAGNOSIS — Z6841 Body Mass Index (BMI) 40.0 and over, adult: Secondary | ICD-10-CM

## 2021-04-30 DIAGNOSIS — E669 Obesity, unspecified: Secondary | ICD-10-CM | POA: Insufficient documentation

## 2021-04-30 DIAGNOSIS — R066 Hiccough: Secondary | ICD-10-CM | POA: Insufficient documentation

## 2021-04-30 MED ORDER — FLUTICASONE PROPIONATE 230 MCG-SALMETEROL 21 MCG/ACTUATION HFA INHALER
2.0000 | INHALATION_SPRAY | Freq: Two times a day (BID) | RESPIRATORY_TRACT | 3 refills | Status: DC
Start: 2021-04-30 — End: 2021-07-17

## 2021-04-30 MED ORDER — AEROCHAMBER W/ FLOWSIGNAL SPACER
0 refills | Status: DC
Start: 2021-04-30 — End: 2021-10-05

## 2021-04-30 NOTE — Progress Notes (Signed)
I saw and examined the patient independently. Case was discussed with the fellow about the history, progress, physical examination, labs, imaging, assessment and plan. I have personally reviewed the imaging studies. I do agree with the above documentation. Any exceptions/additions are edited/noted.  36F presents w/ cough/hiccup    Severe GERD w/ barrett's  BMI 51  Possible OSA  Paroxsymal VC dysfunction  Possible EDAC  Childhood history of asthma    PPi and life style modification  Weight loss, referral to weight loss clinic  Sleep medicine referral  CT neck  CT chest inspiration and expiration  Spirometry w/ BD  Start ICS/LABA bid  Prn SABA  Spacer device  SLP eval and barium swallow  RTC 3 months     Luretha Murphy, MD, [facility]  Assistant Professor  Section of Pulmonary, Critical Care & Sleep Medicine  Department of [place]  [phone]

## 2021-04-30 NOTE — Progress Notes (Signed)
Pulmonary Medicine Clinic, New Patient Visit  Patient: Caitlyn Gomez, 45 y.o. female   MRN:  [id number]  Date: 04/30/2021     Chief Complaint: Bronchospasm/Cough/Hiccups  History of Present Illness  45 y.o. female with PMHx of Morbid Obesity, Childhood asthma GERD, recently diagnosed Chiari malformations who comes to the clinic with complaints of hiccups and episodes of bronchospasm which has been going on for last 2 years.    She was in the neurosurgeons office when she started having these hiccups with episodes of throat closure. She says it feels like her throat was closing and she could not get air in. These episodes occur 40-50 time a day. She has had extensive GERD history on omeprazole BID. She had a EGD earlier this year which showed evidence of Schatzki ring which was partially closed and dilated. EGD also showed Barrets esophagus. In 2021 she had a MBS which did not show evidence of aspiration. She also has had ENT work up in [facility] where she had a direct laryngoscopy. As per her the vocal cords were normal, they may have seen a thyroid nodule and was inflamed likely secondary to GERD.    As per her, GERD symptoms are well controlled on PPI. She also has cough occurs episodically and usually in mornings, dry cough. Also has wheezing accompanying these episodes. She denies allergy like symptoms like nasal congestion, runny nose, eyes or changes in symptoms with perfumes. She had asthma as a child but mainly had prn albuterol and symptoms were mostly cough, she grew out of it when she was an adult.    Due to active Tobacco use and by presence of smoking history her PCP diagnosed COPD and started her on brief course of Anuro-Ellipta. She never had PFT's done at any point. She has some dyspnea but feels its partly due to her weight rather than lung issues.    Pulmonary medications:  Was briefly on Anuro-Ellipta  Not on anything.    Social Hx:   - Tobacco: 25 yr smoking h/o 1 ppf   - Alcohol:  None   - Drugs: None   - Occupation: Works as a Freight forwarder in [employer]    - Exposures (asbestos, [place], silica, mold etc): None   - Travel: No recent trave   - Pets/birds: None  Family Hx: Negative for lung disease and and lung cancers      Allergies   Allergen Reactions   . Amoxicillin    . Sulfa (Sulfonamides)        Review of Systems:  A complete 10 system ROS was reviewed and found to be negative except as mentioned in HPI.      Physical Examination  Vitals:  BP 130/85   Pulse 80   Temp 36.3 C (97.4 F)   Ht 1.676 m (5\' 6" )   Wt (!) 146 kg (321 lb 4.8 oz)   LMP 09/07/2009   SpO2 100%   BMI 51.86 kg/m       General: Patient appears in no acute distress  Eyes: Conjunctiva clear, sclera non-icteric  HENT: Head atraumatic and normocephalic, ENT without erythema or injection, mucous membranes moist  Neck: No JVD, supple, symmetrical, trachea midline, no adenopathy  Lungs: Clear to auscultation bilaterally.  No wheezing, rhonchi or rales appreciated.  Cardiovascular: regular rate and rhythm, S1 and S2 normal.  No murmur.  Abdomen: Soft, non-tender, non-distended, normal bowel sounds  Skin: Warm and dry.  No overt rashes, lesions, or trauma noted  Extremities -  No cyanosis/clubbing or edema  Neuro - alert and oriented   Psych - Normal affect, thought content, memory, behavior    Labs: Reviewed    Radiology:None on file to review      PFT: None      mMRC Dyspnea Scale   2 - walks slower than people of same age or has to stop for breath when walking at own pace    Assessment/Plan:   45 y.o. female with PMHx of Morbid Obesity, Childhood asthma GERD, recently diagnosed Chiari malformations who comes to the clinic with complaints of hiccups and episodes of bronchospasm along which has been going on for last 2 years along with cough and wheezing for 6 months    Differentials for her symptoms are quite broad at this time. These include uncontrolled GERD vs Asthma( Obesity Induced)vs Vocal cord dysfunction with these  episodes of throat closure/spasm vs Dynamic airway collapse as seen in Tracheo-Bronchomalacia with possible component of OSA in setting of morbid Obesity    Plan  -Continue PPI BID. Explained about non-pharmacological techniques for controlling GERD, which she is aware of.  -Ordered PFT's to help delineate underlying cause  -Ordered CT Chest with Inspiratory/Expiratory Films which will help look for Tracheo-broncheomalacia  -CT neck to look for extrinsic neck mass which maybe causing compressive symptoms  -Ordered MBS to look for Aspiration, she had one in 2021 which was negative but this needs to be repeated  -ENT referral for Direct Laryngoscopy to assess vocal cord.   -Sleep medicine referral to rule out OSA  -Will refer her to Weight loss clinic to help lose weight  -Start her on Advair HFA with Spacer to treat for asthma  -Explained inhaler techniques and need for rinsing after each use      Return in about 3 months (around 07/31/2021).     Patient seen and examined with attending physician Dr. Harriet Pho.    Orders Placed This Encounter   . CT SOFT TISSUE NECK WO IV CONTRAST   . CT CHEST WO IV CONTRAST   . FLUORO ESOPHAGRAM, MODIFIED SWALLOW   . Refer to [facility]   . Refer to [facility] or Bariatric Surgery   . Refer to [facility] for Sleep Medicine   . PULMONARY FUNCTION TESTING - ADULT - PERFORM AT [facility]   . fluticasone propion-salmeteroL (ADVAIR) 230-21 mcg/actuation Inhalation oral inhaler   . aerochamber with flowsignal spacer (AEROCHAMBER) Inhaler       Tressie Ellis, MD  Fellow, Pulmonary, Critical Care and Sleep Medicine  Department of Internal Medicine

## 2021-05-01 ENCOUNTER — Encounter (INDEPENDENT_AMBULATORY_CARE_PROVIDER_SITE_OTHER): Payer: Self-pay

## 2021-05-15 ENCOUNTER — Encounter (INDEPENDENT_AMBULATORY_CARE_PROVIDER_SITE_OTHER): Payer: Self-pay | Admitting: Gastroenterology

## 2021-05-16 ENCOUNTER — Ambulatory Visit (INDEPENDENT_AMBULATORY_CARE_PROVIDER_SITE_OTHER): Payer: Self-pay | Admitting: Otolaryngology

## 2021-05-16 ENCOUNTER — Ambulatory Visit (HOSPITAL_COMMUNITY): Payer: Self-pay

## 2021-05-21 ENCOUNTER — Encounter (HOSPITAL_BASED_OUTPATIENT_CLINIC_OR_DEPARTMENT_OTHER): Payer: Self-pay

## 2021-06-03 ENCOUNTER — Encounter (HOSPITAL_COMMUNITY): Payer: Self-pay

## 2021-06-05 IMAGING — MR MRI SHOULDER LT W/O CONTRAST
6 series · 39 of 40 positions shown · IV contrast (gadolinium)
Comparison: None available.

﻿EXAM:  01996   MRI SHOULDER LT W/O CONTRAST
INDICATION: Pain and decreased range of motion.
TECHNIQUE: Multiplanar multisequential MRI of the left shoulder joint was performed without gadolinium contrast.

[Series 8: T1 · oblique · left · 4.0mm · 0.36mm/px · 7 of 18 slices shown]
[im 1/18]
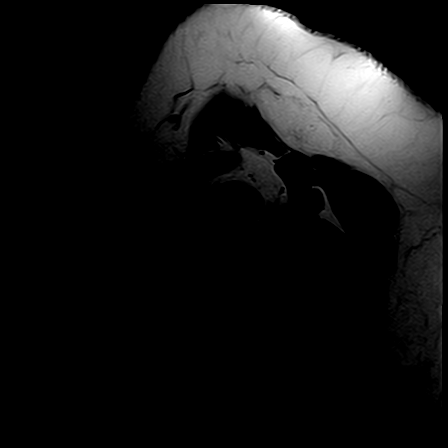
[im 3/18]
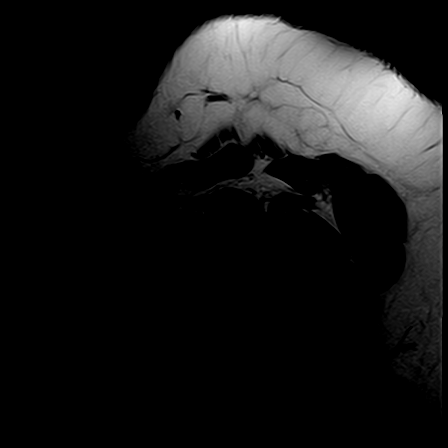
[im 6/18]
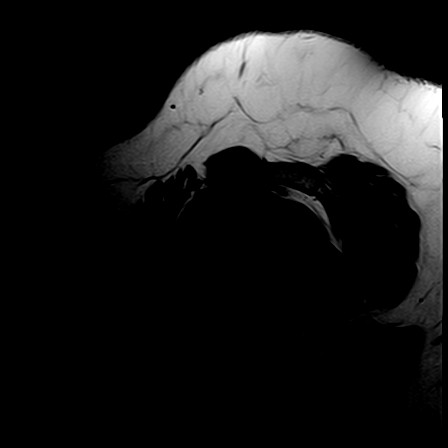
[im 9/18]
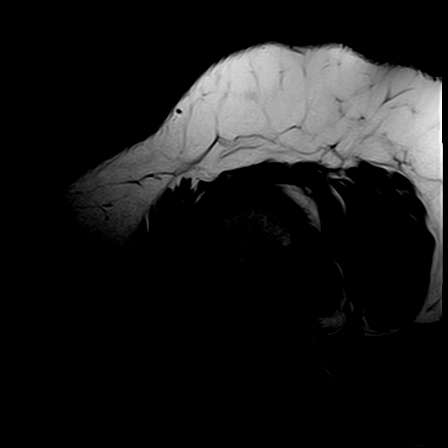
[im 12/18]
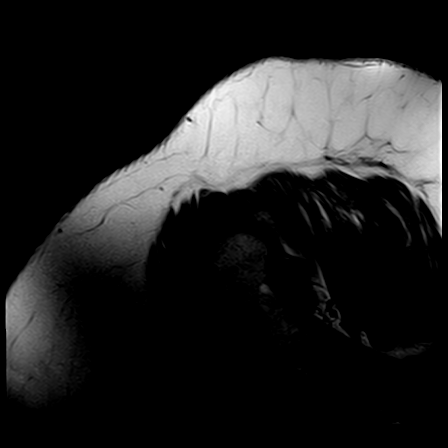
[im 15/18]
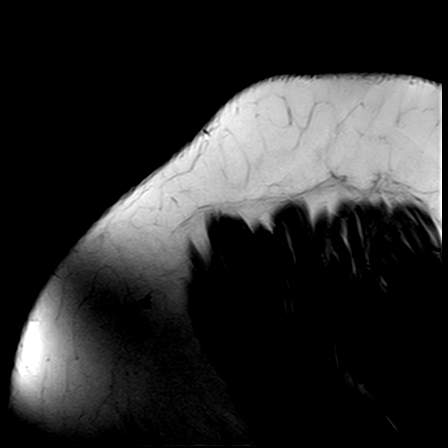
[im 18/18]
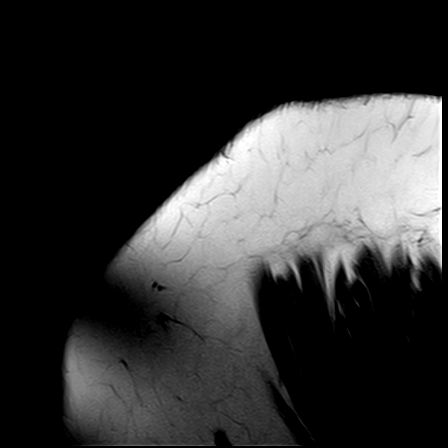

[Series 9: PD fat-sat · axial · left · 4.5mm · 0.56mm/px · z∈[-79,+4]mm · 7 of 18 slices shown (1 of 2)]
[im 1/18]
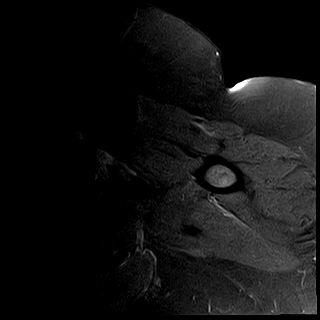
[im 3/18]
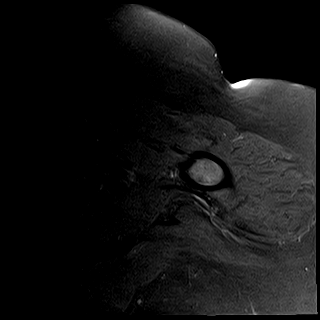
[im 6/18]
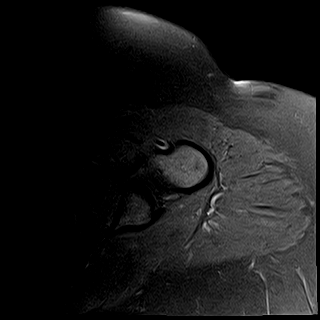
[im 9/18]
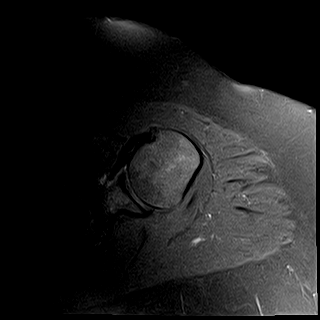
[im 12/18]
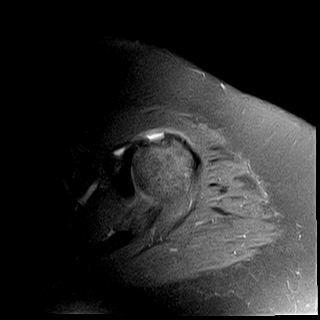
[im 15/18]
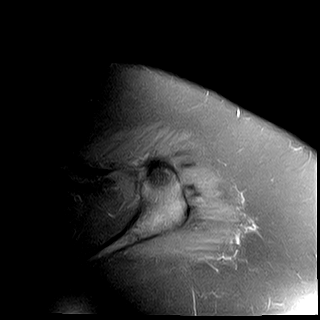
[im 18/18]
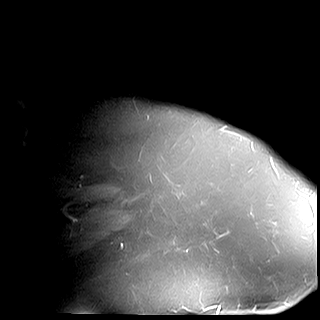

[Series 10: STIR · oblique · left · 4.0mm · 0.50mm/px · 6 of 18 slices shown (1 of 2)]
[im 1/18]
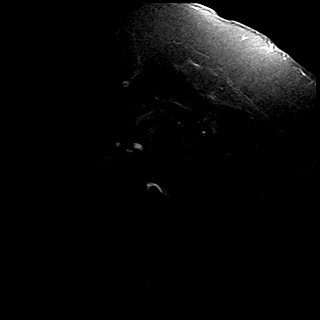
[im 4/18]
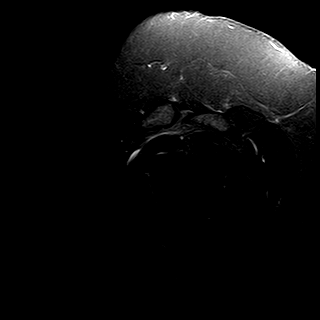
[im 7/18]
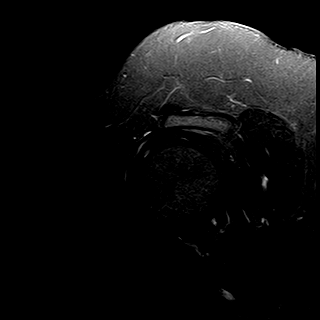
[im 11/18]
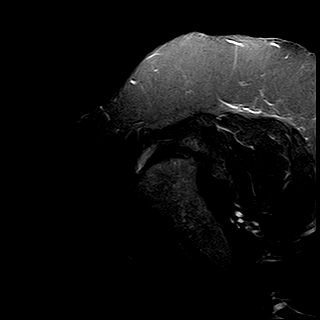
[im 14/18]
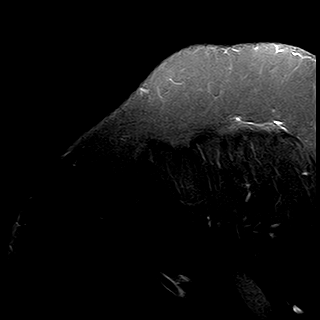
[im 18/18]
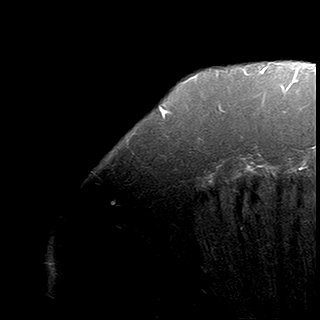

[Series 11: T2 fat-sat · axial · left · 4.0mm · 0.42mm/px · z∈[-86,+15]mm · 8 of 24 slices shown]
[im 1/24]
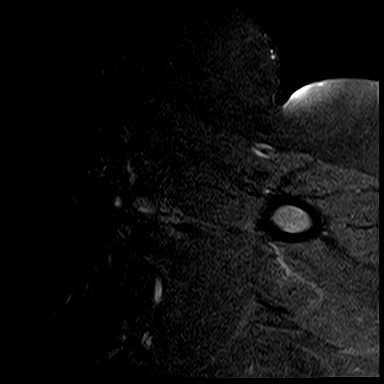
[im 4/24]
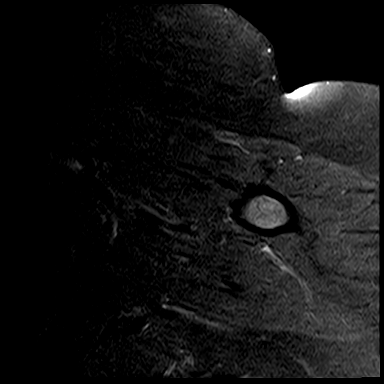
[im 7/24]
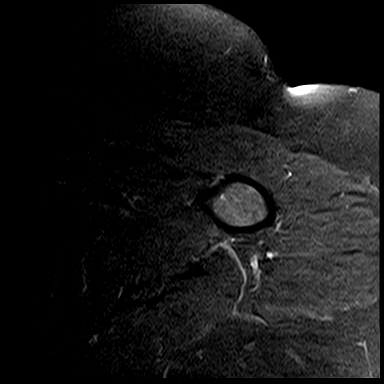
[im 10/24]
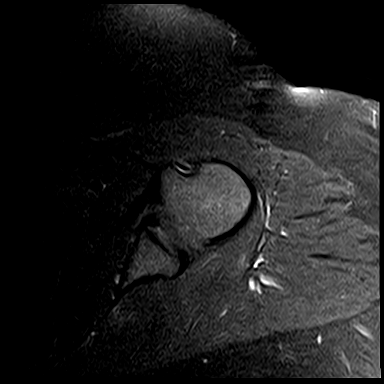
[im 14/24]
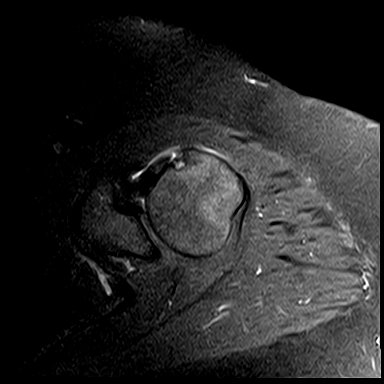
[im 17/24]
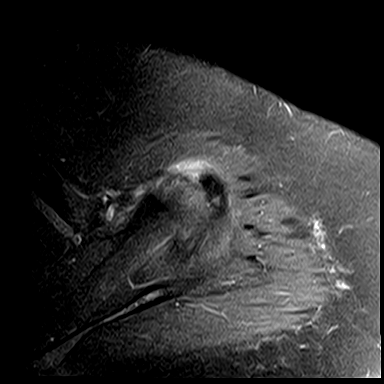
[im 20/24]
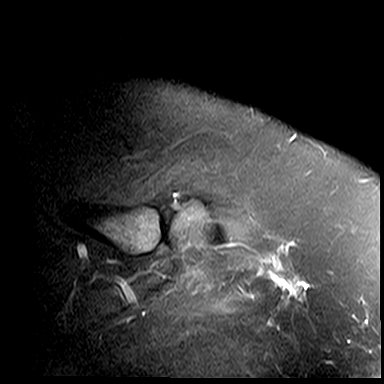
[im 24/24]
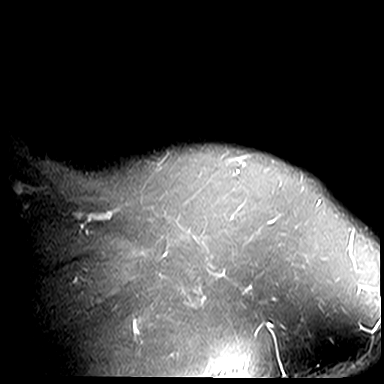

[Series 12: PD fat-sat · oblique · left · 4.0mm · 0.50mm/px · 6 of 18 slices shown (2 of 2)]
[im 1/18]
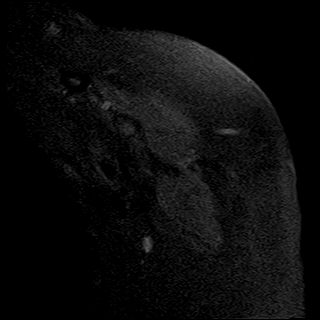
[im 4/18]
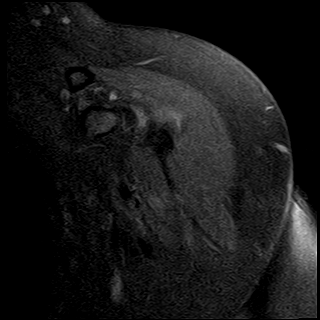
[im 7/18]
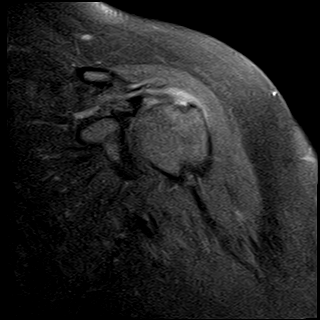
[im 11/18]
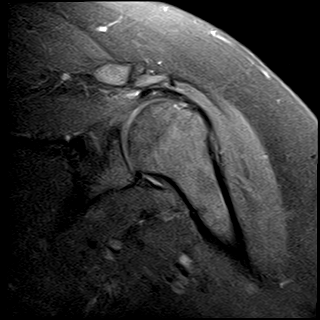
[im 14/18]
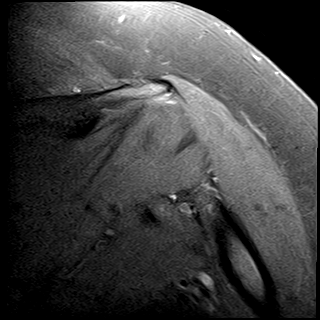
[im 18/18]
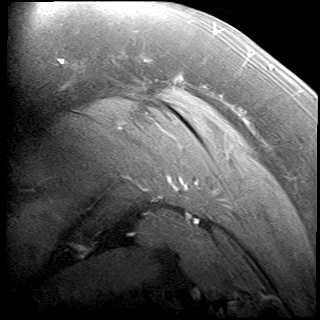

[Series 13: STIR · oblique · left · 4.0mm · 0.50mm/px · 5 of 18 slices shown (2 of 2)]
[im 1/18]
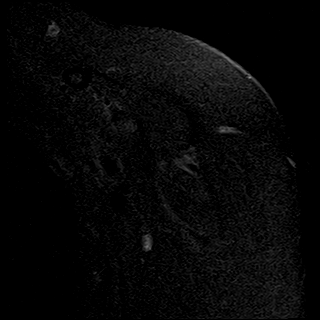
[im 4/18]
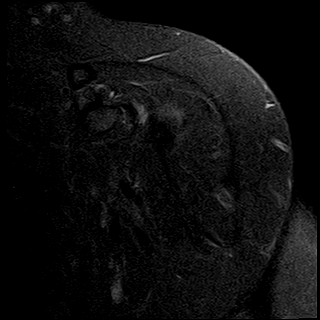
[im 7/18]
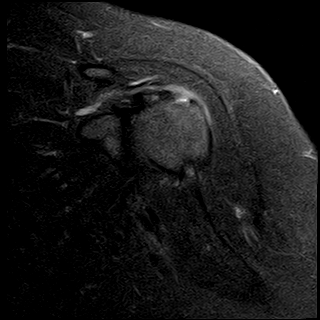
[im 11/18]
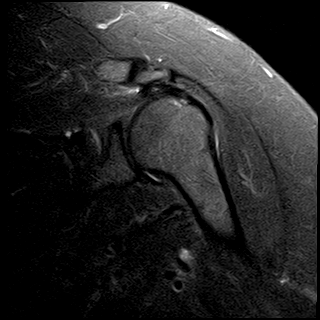
[im 14/18]
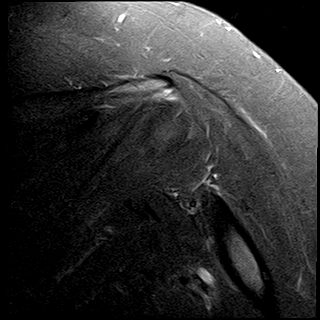

[39 of 40 positions shown; findings below may reference images not displayed]

FINDINGS: There is a full-thickness supraspinatus tendon tear. Infraspinatus, teres minor and subscapularis muscles and tendons are within normal limits in morphology and signal intensity. Long head of biceps tendon is well seated within the intertubercular groove and attaches normally to the biceps anchor. Acromioclavicular joint is unremarkable. There is no significant fluid within the subacromial/subdeltoid bursa. Muscle bulk and bone marrow signal intensity are normal. No mass is seen along the course of the suprascapular nerve, within the spinoglenoid notch or within the quadrilateral space.
IMPRESSION: Full-thickness supraspinatus tendon tear.

## 2021-06-18 ENCOUNTER — Telehealth (INDEPENDENT_AMBULATORY_CARE_PROVIDER_SITE_OTHER): Payer: Self-pay | Admitting: Student in an Organized Health Care Education/Training Program

## 2021-06-18 NOTE — Telephone Encounter (Signed)
Hello,    FYI     This patient was scheduled for their MODIFIED BARIUM SWALLOW and had NO SHOWED this testing on 05/16/2021. They have not reached out to reschedule at this time.     Thanks,   Sharman Crate  06/18/2021, 09:39

## 2021-07-17 ENCOUNTER — Other Ambulatory Visit (INDEPENDENT_AMBULATORY_CARE_PROVIDER_SITE_OTHER): Payer: Self-pay | Admitting: Student in an Organized Health Care Education/Training Program

## 2021-07-17 MED ORDER — FLUTICASONE PROPIONATE 230 MCG-SALMETEROL 21 MCG/ACTUATION HFA INHALER
2.0000 | INHALATION_SPRAY | Freq: Two times a day (BID) | RESPIRATORY_TRACT | 3 refills | Status: DC
Start: 2021-07-17 — End: 2023-03-17

## 2021-07-17 MED ORDER — PROAIR HFA 90 MCG/ACTUATION AEROSOL INHALER
1.0000 | INHALATION_SPRAY | RESPIRATORY_TRACT | 3 refills | Status: AC | PRN
Start: 2021-07-17 — End: ?

## 2021-07-17 NOTE — Telephone Encounter (Signed)
-----   Message from [facility] sent at 07/17/2021  1:12 PM EST -----  Tressie Ellis, MD  Pt needs a refill on fluticasone propion-salmeteroL (ADVAIR) 230-21 mcg/actuation Inhalation oral inhaler 12 g 3 04/30/2021    Sig - Route: Take 2 Puffs by inhalation Twice daily - Inhalation     albuterol 90 mcg per inhalation oral inhaler     Called into [facility], [place] [address]    Phone: [phone] Fax: [phone]    Hours: Not open 24 hours

## 2021-07-20 HISTORY — PX: BLADDER SURGERY: SHX569

## 2021-08-05 ENCOUNTER — Ambulatory Visit (HOSPITAL_COMMUNITY): Payer: Self-pay

## 2021-08-05 ENCOUNTER — Encounter (INDEPENDENT_AMBULATORY_CARE_PROVIDER_SITE_OTHER): Payer: Self-pay | Admitting: Student in an Organized Health Care Education/Training Program

## 2021-08-12 ENCOUNTER — Ambulatory Visit (HOSPITAL_BASED_OUTPATIENT_CLINIC_OR_DEPARTMENT_OTHER): Payer: Self-pay | Admitting: Family Medicine

## 2021-09-09 ENCOUNTER — Ambulatory Visit (HOSPITAL_COMMUNITY): Payer: Self-pay

## 2021-09-12 ENCOUNTER — Encounter (INDEPENDENT_AMBULATORY_CARE_PROVIDER_SITE_OTHER): Payer: Self-pay | Admitting: Neurological Surgery

## 2021-10-04 ENCOUNTER — Other Ambulatory Visit: Payer: Self-pay

## 2021-10-04 ENCOUNTER — Ambulatory Visit (HOSPITAL_BASED_OUTPATIENT_CLINIC_OR_DEPARTMENT_OTHER): Payer: Medicaid Other

## 2021-10-04 ENCOUNTER — Observation Stay
Admission: EM | Admit: 2021-10-04 | Discharge: 2021-10-05 | Discharge status: Against Medical Advice | Payer: Medicaid Other | Attending: HOSPITALIST | Admitting: HOSPITALIST

## 2021-10-04 DIAGNOSIS — F17210 Nicotine dependence, cigarettes, uncomplicated: Secondary | ICD-10-CM | POA: Insufficient documentation

## 2021-10-04 DIAGNOSIS — I4891 Unspecified atrial fibrillation: Secondary | ICD-10-CM | POA: Insufficient documentation

## 2021-10-04 DIAGNOSIS — I10 Essential (primary) hypertension: Secondary | ICD-10-CM | POA: Insufficient documentation

## 2021-10-04 DIAGNOSIS — F17200 Nicotine dependence, unspecified, uncomplicated: Secondary | ICD-10-CM

## 2021-10-04 DIAGNOSIS — R059 Cough, unspecified: Secondary | ICD-10-CM

## 2021-10-04 DIAGNOSIS — E785 Hyperlipidemia, unspecified: Secondary | ICD-10-CM | POA: Insufficient documentation

## 2021-10-04 DIAGNOSIS — R0989 Other specified symptoms and signs involving the circulatory and respiratory systems: Secondary | ICD-10-CM

## 2021-10-04 DIAGNOSIS — Z7901 Long term (current) use of anticoagulants: Secondary | ICD-10-CM | POA: Insufficient documentation

## 2021-10-04 DIAGNOSIS — J441 Chronic obstructive pulmonary disease with (acute) exacerbation: Principal | ICD-10-CM | POA: Insufficient documentation

## 2021-10-04 DIAGNOSIS — Z20822 Contact with and (suspected) exposure to covid-19: Secondary | ICD-10-CM | POA: Insufficient documentation

## 2021-10-04 DIAGNOSIS — F32A Depression, unspecified: Secondary | ICD-10-CM | POA: Insufficient documentation

## 2021-10-04 DIAGNOSIS — M25512 Pain in left shoulder: Secondary | ICD-10-CM | POA: Insufficient documentation

## 2021-10-04 DIAGNOSIS — Z7951 Long term (current) use of inhaled steroids: Secondary | ICD-10-CM | POA: Insufficient documentation

## 2021-10-04 DIAGNOSIS — Z6841 Body Mass Index (BMI) 40.0 and over, adult: Secondary | ICD-10-CM

## 2021-10-04 DIAGNOSIS — J449 Chronic obstructive pulmonary disease, unspecified: Secondary | ICD-10-CM

## 2021-10-04 DIAGNOSIS — K219 Gastro-esophageal reflux disease without esophagitis: Secondary | ICD-10-CM | POA: Insufficient documentation

## 2021-10-04 DIAGNOSIS — Z79899 Other long term (current) drug therapy: Secondary | ICD-10-CM | POA: Insufficient documentation

## 2021-10-04 HISTORY — DX: Personal history of malignant neoplasm of cervix uteri: Z85.41

## 2021-10-04 HISTORY — DX: Nicotine dependence, unspecified, uncomplicated: F17.200

## 2021-10-04 LAB — COMPREHENSIVE METABOLIC PANEL, NON-FASTING
ALBUMIN/GLOBULIN RATIO: 1.1 (ref 0.8–1.4)
ALBUMIN: 3.8 g/dL (ref 3.5–5.7)
ALKALINE PHOSPHATASE: 76 U/L (ref 34–104)
ALT (SGPT): 20 U/L (ref 7–52)
ANION GAP: 8 mmol/L — ABNORMAL LOW (ref 10–20)
AST (SGOT): 16 U/L (ref 13–39)
BILIRUBIN TOTAL: 0.3 mg/dL (ref 0.3–1.2)
BUN/CREA RATIO: 13 (ref 6–22)
BUN: 9 mg/dL (ref 7–25)
CALCIUM, CORRECTED: 8.6 mg/dL — ABNORMAL LOW (ref 8.9–10.8)
CALCIUM: 8.4 mg/dL — ABNORMAL LOW (ref 8.6–10.3)
CHLORIDE: 107 mmol/L (ref 98–107)
CO2 TOTAL: 25 mmol/L (ref 21–31)
CREATININE: 0.72 mg/dL (ref 0.60–1.30)
ESTIMATED GFR: 106 mL/min/{1.73_m2} (ref >59)
GLOBULIN: 3.4 (ref 2.9–5.4)
GLUCOSE: 96 mg/dL (ref 74–109)
OSMOLALITY, CALCULATED: 278 mOsm/kg (ref 270–290)
POTASSIUM: 3.4 mmol/L — ABNORMAL LOW (ref 3.5–5.1)
PROTEIN TOTAL: 7.2 g/dL (ref 6.4–8.9)
SODIUM: 140 mmol/L (ref 136–145)

## 2021-10-04 LAB — COVID-19, FLU A/B, RSV RAPID BY PCR
INFLUENZA VIRUS TYPE A: NOT DETECTED
INFLUENZA VIRUS TYPE B: NOT DETECTED
RESPIRATORY SYNCTIAL VIRUS (RSV): NOT DETECTED
SARS-CoV-2: NOT DETECTED

## 2021-10-04 LAB — LACTIC ACID LEVEL W/ REFLEX FOR LEVEL >2.0: LACTIC ACID: 1.2 mmol/L (ref 0.5–2.2)

## 2021-10-04 MED ORDER — IPRATROPIUM 0.5 MG-ALBUTEROL 3 MG (2.5 MG BASE)/3 ML NEBULIZATION SOLN
3.0000 mL | INHALATION_SOLUTION | RESPIRATORY_TRACT | Status: AC
Start: 2021-10-04 — End: 2021-10-04
  Administered 2021-10-04: 3 mL via RESPIRATORY_TRACT

## 2021-10-04 NOTE — ED Provider Notes (Signed)
[facility]  Emergency Department  Attending Provider Note      CHIEF COMPLAINT  Chief Complaint   Patient presents with   . Congestion   . Cough     HISTORY OF PRESENT ILLNESS  Caitlyn Gomez, date of birth [birth year], is a 46 y.o. female who presented to the Emergency Department with cough, congestion, shortness of breath.  She states she was seen here in the ED recently and diagnosed with a in acute exacerbation of COPD.  She states she was given prescription for nebulizer treatments, antibiotics, and steroids.  She states she had no improvement from the medications and now feels worse.  She does continue to smoke.    PAST MEDICAL/SURGICAL/FAMILY/SOCIAL HISTORY  Past Medical History:   Diagnosis Date   . A-fib (CMS HCC)     Dr. Chauncey Cruel. Lucio Edward   . Abnormal Pap smear    . Anxiety    . Arthropathy    . Asthma    . Cancer (CMS HCC)     ovaries   . Chronic obstructive airway disease (CMS HCC)    . Coronary artery disease    . Depression    . Diabetes    . Dysplasia of cervix    . Dysrhythmias     a fib   . Ectopic pregnancy    . Fibromyalgia    . GERD (gastroesophageal reflux disease)     controlled with med   . History of anesthesia complications     aspirated for scope last time   . HTN (hypertension)    . Hyperlipidemia    . Kidney disease    . NAFLD (nonalcoholic fatty liver disease)    . Neck mass    . PCOS (polycystic ovarian syndrome)    . Psychiatric problem    . Shortness of breath     with exertion   . Stroke (CMS [facility])     TIA- [place] - 2016 - (left facial droop and left arm weakness)   . Thyroid disorder     nodule   . Thyroid nodule    . Vaginal infection    . Varicosities        Past Surgical History:   Procedure Laterality Date   . HX APPENDECTOMY     . HX DILATION AND CURETTAGE     . HX ENDOMETRIAL ABLATION     . HX HEART CATHETERIZATION      stent   . HX HYSTERECTOMY     . HX TONSILLECTOMY     . HX TUBAL LIGATION         Family Medical History:     Problem Relation (Age of Onset)    Colon  Cancer Maternal Grandfather    Colon Polyps Maternal Grandmother    Diabetes Maternal Grandmother, Paternal 106    Healthy Mother    Heart Disease Father    Hypertension (High Blood Pressure) Father    Inflammatory Bowel Dz Maternal cousin    Stomach Cancer Maternal Aunt    Sudden Death no cause Other        Social History     Socioeconomic History   . Marital status: Legally Separated   . Number of children: 2   Occupational History   . Occupation: Engineer, mining   Tobacco Use   . Smoking status: Every Day     Packs/day: 0.50     Years: 15.00     Pack years: 7.50  Types: Cigarettes   . Smokeless tobacco: Never   Vaping Use   . Vaping Use: Never used   Substance and Sexual Activity   . Alcohol use: Yes     Alcohol/week: 0.8 standard drinks     Types: 1 Glasses of wine per week     Comment: 2-3 times a month;   Marland Kitchen Drug use: Never   . Sexual activity: Not Currently     Partners: Male     Birth control/protection: Other   Other Topics Concern   . Ability to Walk 1 Flight of Steps without SOB/CP Yes   . Routine Exercise No   . Ability to Walk 2 Flight of Steps without SOB/CP No     Comment: SOB   . Ability To Do Own ADL's Yes   Social History Narrative    1 child deceased, 1 living      ALLERGIES  Allergies   Allergen Reactions   . Amoxicillin    . Sulfa (Sulfonamides)        PHYSICAL EXAM  VITAL SIGNS:  Filed Vitals:    10/04/21 2042   BP: (!) 147/90   Pulse: (!) 102   Resp: (!) 24   Temp: 36.9 C (98.4 F)   SpO2: 99%     GENERAL: PATIENT IS ALERT AND ORIENTED TO PERSON, PLACE, AND TIME.  HEAD: NORMOCEPHALIC AND ATRAUMATIC.  EYES: PUPILS EQUALLY ROUND AND REACT TO LIGHT. EXTRAOCULAR MOVEMENTS INTACT.  EARS: GROSS HEARING INTACT. EXTERNAL EARS WITHIN NORMAL LIMITS.  NOSE: NO SEPTAL DEVIATION. NASAL PASSAGES CLEAR.  THROAT: MOIST ORAL MUCOSA. NO ERYTHEMA OR EXUDATE OF THE PHARYNX.  NECK: SUPPLE. TRACHEA MIDLINE.  CARDIOVASCULAR: REGULAR, RATE, AND RHYTHM. NO MURMUR.  LUNGS:  Diffusely decreased breath sounds,  scattered wheezes  ABDOMEN: SOFT, NON-TENDER, NON-DISTENDED, AND BOWEL SOUNDS ARE PRESENT.  GENITOURINARY: DEFERRED.  RECTAL: DEFERRED.  EXTREMITIES: NO CYANOSIS, CLUBBING, OR EDEMA.  SKIN: WARM AND DRY.  MUSCULOSKELETAL:  MOVES ALL 4 EXTREMITIES, NO OBVIOUS DEFORMITIES.  NEUROLOGIC: CRANIAL NERVES II THROUGH XII ARE GROSSLY INTACT. SENSATION TO LIGHT TOUCH IS INTACT.  PSYCHIATRIC: JUDGMENT AND INSIGHT ARE SEEMINGLY INTACT. MOOD AND AFFECT ARE APPROPRIATE FOR THE SITUATION.    DIAGNOSTICS  Labs:  Labs listed below were reviewed and interpreted by me.  Results for orders placed or performed during the hospital encounter of 10/04/21   COMPREHENSIVE METABOLIC PANEL, NON-FASTING   Result Value Ref Range    SODIUM 140 136 - 145 mmol/L    POTASSIUM 3.4 (L) 3.5 - 5.1 mmol/L    CHLORIDE 107 98 - 107 mmol/L    CO2 TOTAL 25 21 - 31 mmol/L    ANION GAP 8 (L) 10 - 20 mmol/L    BUN 9 7 - 25 mg/dL    CREATININE 0.72 0.60 - 1.30 mg/dL    BUN/CREA RATIO 13 6 - 22    ESTIMATED GFR 106 >59 mL/min/1.11m2    ALBUMIN 3.8 3.5 - 5.7 g/dL    CALCIUM 8.4 (L) 8.6 - 10.3 mg/dL    GLUCOSE 96 74 - 109 mg/dL    ALKALINE PHOSPHATASE 76 34 - 104 U/L    ALT (SGPT) 20 7 - 52 U/L    AST (SGOT) 16 13 - 39 U/L    BILIRUBIN TOTAL 0.3 0.3 - 1.2 mg/dL    PROTEIN TOTAL 7.2 6.4 - 8.9 g/dL    ALBUMIN/GLOBULIN RATIO 1.1 0.8 - 1.4    OSMOLALITY, CALCULATED 278 270 - 290 mOsm/kg    CALCIUM, CORRECTED  8.6 (L) 8.9 - 10.8 mg/dL    GLOBULIN 3.4 2.9 - 5.4   LACTIC ACID LEVEL W/ REFLEX FOR LEVEL >2.0   Result Value Ref Range    LACTIC ACID 1.2 0.5 - 2.2 mmol/L   COVID-19, FLU A/B, RSV RAPID BY PCR   Result Value Ref Range    SARS-CoV-2 Not Detected Not Detected    INFLUENZA VIRUS TYPE A Not Detected Not Detected    INFLUENZA VIRUS TYPE B Not Detected Not Detected    RESPIRATORY SYNCTIAL VIRUS (RSV) Not Detected Not Detected     Radiology:  Results for orders placed or performed during the hospital encounter of 10/04/21   XR CHEST PA AND LATERAL     Status: None     Narrative    FINAL REPORT  [facility]      Patient Name: TIDA, SANER  Age: 67  Patient MR NO: [id number]  Date Of Birth: 01-24-1976  Referring Doctor: Andree Elk Accession No: [id number]  Patient Location: Emergency Department         CLINICAL INDICATION: COUGH, CONGESTION X 2 WEEKS, HX OF COPD, PT UNABLE TO RAISE LEFT ARM DUE TO RECENT ROTATOR CUFF SURGERY BEST POSSIBLE IMAGES  CONTRAST: No Contrast  PROCEDURE DATE: 10/04/2021    XRAY CHEST    TECHNIQUE: X-ray of the chest two views was performed per as low as reasonably achievable (ALARA) protocols.               COMPARISON: None.               HISTORY: Cough, congestion.                FINDINGS:               No focal airspace opacity, pleural effusion, or pneumothorax.                Normal cardiomediastinal silhouette.                There is a suture anchor in the left shoulder, related to rotator cuff surgery.               IMPRESSION:               1. Unremarkable chest radiograph, with no focal airspace opacity.                   To TALK to On Call [phone]           Patient Name:Cretella, SKILER  MR VO:[id number]  Accession [id number]    Karma Ganja M.D.  Electronically Signed      Date Of Exam Request:10/04/2021 9:45:20 PM EDT  Date & Time Of Report: 10/04/2021 10:18:50 PM EDT    Patient Name:Brosh, NATISHA  MR JK:[id number]  Accession [id number]         ED COURSE/MEDICAL DECISION MAKING  Medications Administered in the ED   ipratropium-albuterol 0.5 mg-3 mg(2.5 mg base)/3 mL Solution for Nebulization (3 mL Nebulization Given 10/04/21 2301)      ED Course as of 10/04/21 2320   Sat Oct 04, 2021   2314 LACTIC ACID: 1.2  Normal   2315 POTASSIUM(!): 3.4  Low end of normal, no need to address   2315 CALCIUM(!): 8.4  Also low end of normal, no need to address   2316 XR CHEST PA AND LATERAL  No acute infiltrates.      Medical Decision Making  The patient presented to the  ED with cough congestion shortness of breath the past 10  days.  She states she was seen in the ED and diagnosed with and diagnosed with acute exacerbation of COPD.  The patient did complete a full course of antibiotic and steroid.  She also has been using albuterol nebulizer treatments at home without improvement.  The case was discussed with the nocturnist who will admit the patient.    Amount and/or Complexity of Data Reviewed  Labs: ordered. Decision-making details documented in ED Course.  Radiology: independent interpretation performed.          CLINICAL IMPRESSION  Clinical Impression   Acute exacerbation of chronic obstructive pulmonary disease (COPD) (CMS HCC) (Primary)     DISPOSITION  Admitted       DISCHARGE MEDICATIONS  Current Discharge Medication List          Quita Skye Sabra Heck D.O.   10/04/2021, 21:25   [facility]  Department of Emergency Medicine  [facility]    This note was partially generated using MModal Fluency Direct system, and there may be some incorrect words, spellings, and punctuation that were not noted in checking the note before saving.    -----

## 2021-10-04 NOTE — ED Triage Notes (Signed)
Cough and congestion x ten days, no relief with meds received in ER at that time

## 2021-10-04 NOTE — ED Triage Notes (Signed)
[facility]  Emergency Department  Provider in Triage Note    Name: Caitlyn Gomez  Age: 46 y.o.  Gender: female     Subjective:   Caitlyn Gomez is a 46 y.o. female who presents with complaint of Congestion  . Patient here with c/o cough and congestion x 2 weeks. Reports she was seen in ED last week. Was prescribed Albuterol, Prednisone and Zpack. Has had 2 negative COVID tests at home. Reports history of COPD.     Objective:   Filed Vitals:    10/04/21 2042   BP: (!) 147/90   Pulse: (!) 102   Resp: (!) 24   Temp: 36.9 C (98.4 F)   SpO2: 99%      Focused Physical Exam shows patient sitting in chair, slightly labored. Patient's voice is hoarse. Lung sounds clear and equal bilaterally. Pulse Ox 99% on room air.     Assessment:  A medical screening exam was completed.  This patient is a 46 y.o. female with initial findings showing cough.     Plan:  Please see initial orders and work-up below.  This is to be continued with full evaluation in the main Emergency Department.     No current facility-administered medications for this encounter.     No results found for this or any previous visit (from the past 24 hour(s)).     Hewitt Shorts, FNP-BC  10/04/2021, 20:41

## 2021-10-05 ENCOUNTER — Encounter (HOSPITAL_COMMUNITY): Payer: Self-pay | Admitting: Internal Medicine

## 2021-10-05 DIAGNOSIS — I4891 Unspecified atrial fibrillation: Secondary | ICD-10-CM

## 2021-10-05 DIAGNOSIS — F32A Depression, unspecified: Secondary | ICD-10-CM

## 2021-10-05 DIAGNOSIS — I10 Essential (primary) hypertension: Secondary | ICD-10-CM

## 2021-10-05 DIAGNOSIS — J449 Chronic obstructive pulmonary disease, unspecified: Secondary | ICD-10-CM

## 2021-10-05 DIAGNOSIS — K219 Gastro-esophageal reflux disease without esophagitis: Secondary | ICD-10-CM

## 2021-10-05 DIAGNOSIS — F17200 Nicotine dependence, unspecified, uncomplicated: Secondary | ICD-10-CM

## 2021-10-05 DIAGNOSIS — J441 Chronic obstructive pulmonary disease with (acute) exacerbation: Secondary | ICD-10-CM | POA: Diagnosis present

## 2021-10-05 HISTORY — DX: Essential (primary) hypertension: I10

## 2021-10-05 HISTORY — DX: Depression, unspecified: F32.A

## 2021-10-05 LAB — CBC WITH DIFF
BASOPHIL #: 0.1 10*3/uL (ref 0.00–2.50)
BASOPHIL #: 0 10*3/uL (ref 0.00–2.50)
BASOPHIL %: 1 % (ref 0–3)
BASOPHIL %: 0 % (ref 0–3)
EOSINOPHIL #: 0 10*3/uL (ref 0.00–2.40)
EOSINOPHIL #: 0 10*3/uL (ref 0.00–2.40)
EOSINOPHIL %: 0 % (ref 0–7)
EOSINOPHIL %: 0 % (ref 0–7)
HCT: 37.8 % (ref 37.0–47.0)
HCT: 37.7 % (ref 37.0–47.0)
HGB: 12.6 g/dL (ref 12.5–16.0)
HGB: 12.5 g/dL (ref 12.5–16.0)
LYMPHOCYTE #: 0.9 10*3/uL — ABNORMAL LOW (ref 2.10–11.00)
LYMPHOCYTE #: 0.5 10*3/uL — ABNORMAL LOW (ref 2.10–11.00)
LYMPHOCYTE %: 9 % — ABNORMAL LOW (ref 25–45)
LYMPHOCYTE %: 8 % — ABNORMAL LOW (ref 25–45)
MCH: 28.6 pg (ref 27.0–32.0)
MCH: 28 pg (ref 27.0–32.0)
MCHC: 33.4 g/dL (ref 32.0–36.0)
MCHC: 33.1 g/dL (ref 32.0–36.0)
MCV: 85.7 fL (ref 78.0–99.0)
MCV: 84.7 fL (ref 78.0–99.0)
MONOCYTE #: 0.7 10*3/uL (ref 0.00–4.10)
MONOCYTE #: 0.1 10*3/uL (ref 0.00–4.10)
MONOCYTE %: 7 % (ref 0–12)
MONOCYTE %: 1 % (ref 0–12)
MPV: 9.4 fL (ref 7.4–10.4)
MPV: 9 fL (ref 7.4–10.4)
NEUTROPHIL #: 8 10*3/uL (ref 4.10–29.00)
NEUTROPHIL #: 6.3 10*3/uL (ref 4.10–29.00)
NEUTROPHIL %: 82 % — ABNORMAL HIGH (ref 40–76)
NEUTROPHIL %: 91 % — ABNORMAL HIGH (ref 40–76)
PLATELET COMMENT: NORMAL
PLATELETS: 258 10*3/uL (ref 140–440)
PLATELETS: 224 10*3/uL (ref 140–440)
RBC: 4.41 10*6/uL (ref 4.20–5.40)
RBC: 4.45 10*6/uL (ref 4.20–5.40)
RDW: 14.2 % (ref 11.6–14.8)
RDW: 14 % (ref 11.6–14.8)
WBC: 9.7 10*3/uL (ref 4.0–10.5)
WBC: 6.9 10*3/uL (ref 4.0–10.5)
WBCS UNCORRECTED: 9.7 10*3/uL
WBCS UNCORRECTED: 6.9 10*3/uL

## 2021-10-05 LAB — BASIC METABOLIC PANEL
ANION GAP: 8 mmol/L — ABNORMAL LOW (ref 10–20)
BUN/CREA RATIO: 14 (ref 6–22)
BUN: 9 mg/dL (ref 7–25)
CALCIUM: 8.6 mg/dL (ref 8.6–10.3)
CHLORIDE: 107 mmol/L (ref 98–107)
CO2 TOTAL: 23 mmol/L (ref 21–31)
CREATININE: 0.64 mg/dL (ref 0.60–1.30)
ESTIMATED GFR: 112 mL/min/{1.73_m2} (ref >59)
GLUCOSE: 137 mg/dL — ABNORMAL HIGH (ref 74–109)
OSMOLALITY, CALCULATED: 277 mOsm/kg (ref 270–290)
POTASSIUM: 4.4 mmol/L (ref 3.5–5.1)
SODIUM: 138 mmol/L (ref 136–145)

## 2021-10-05 MED ORDER — SODIUM CHLORIDE 0.9 % INTRAVENOUS PIGGYBACK
1.0000 g | Freq: Once | INTRAVENOUS | Status: AC
Start: 2021-10-05 — End: 2021-10-05
  Administered 2021-10-05: 0 g via INTRAVENOUS
  Administered 2021-10-05: 1 g via INTRAVENOUS

## 2021-10-05 MED ORDER — ISOSORBIDE MONONITRATE ER 30 MG TABLET,EXTENDED RELEASE 24 HR
30.0000 mg | ORAL_TABLET | Freq: Every morning | ORAL | Status: DC
Start: 2021-10-05 — End: 2021-10-05
  Administered 2021-10-05: 30 mg via ORAL

## 2021-10-05 MED ORDER — METHYLPREDNISOLONE SOD SUCC 125 MG SOLUTION FOR INJECTION WRAPPER
62.5000 mg | Freq: Four times a day (QID) | INTRAVENOUS | Status: DC
Start: 2021-10-05 — End: 2021-10-05
  Administered 2021-10-05 (×3): 62.5 mg via INTRAVENOUS

## 2021-10-05 MED ORDER — APIXABAN 5 MG TABLET
5.0000 mg | ORAL_TABLET | Freq: Every day | ORAL | Status: DC
Start: 2021-10-05 — End: 2021-10-05

## 2021-10-05 MED ORDER — BETHANECHOL CHLORIDE 25 MG TABLET
6.2500 mg | ORAL_TABLET | Freq: Every evening | ORAL | Status: DC
Start: 2021-10-05 — End: 2021-10-05
  Filled 2021-10-05: qty 0.25

## 2021-10-05 MED ORDER — METHYLPREDNISOLONE SOD SUCC 125 MG SOLUTION FOR INJECTION WRAPPER
INTRAVENOUS | Status: AC
Start: 2021-10-05 — End: 2021-10-05
  Filled 2021-10-05: qty 2

## 2021-10-05 MED ORDER — IPRATROPIUM 0.5 MG-ALBUTEROL 3 MG (2.5 MG BASE)/3 ML NEBULIZATION SOLN
3.0000 mL | INHALATION_SOLUTION | RESPIRATORY_TRACT | Status: DC
Start: 2021-10-05 — End: 2021-10-05
  Administered 2021-10-05 (×4): 3 mL via RESPIRATORY_TRACT

## 2021-10-05 MED ORDER — PANTOPRAZOLE 40 MG TABLET,DELAYED RELEASE
40.0000 mg | DELAYED_RELEASE_TABLET | Freq: Two times a day (BID) | ORAL | Status: DC
Start: 2021-10-05 — End: 2021-10-05
  Administered 2021-10-05: 40 mg via ORAL

## 2021-10-05 MED ORDER — ZOLPIDEM 5 MG TABLET
5.0000 mg | ORAL_TABLET | Freq: Every evening | ORAL | Status: DC | PRN
Start: 2021-10-05 — End: 2021-10-05

## 2021-10-05 MED ORDER — APIXABAN 5 MG TABLET
ORAL_TABLET | ORAL | Status: AC
Start: 2021-10-05 — End: 2021-10-05
  Filled 2021-10-05: qty 1

## 2021-10-05 MED ORDER — METOPROLOL SUCCINATE ER 25 MG TABLET,EXTENDED RELEASE 24 HR
ORAL_TABLET | ORAL | Status: AC
Start: 2021-10-05 — End: 2021-10-05
  Filled 2021-10-05: qty 2

## 2021-10-05 MED ORDER — APIXABAN 5 MG TABLET
5.0000 mg | ORAL_TABLET | Freq: Two times a day (BID) | ORAL | Status: DC
Start: 2021-10-05 — End: 2021-10-05
  Administered 2021-10-05: 5 mg via ORAL

## 2021-10-05 MED ORDER — NITROGLYCERIN 0.4 MG SUBLINGUAL TABLET
0.4000 mg | SUBLINGUAL_TABLET | SUBLINGUAL | Status: DC | PRN
Start: 2021-10-05 — End: 2021-10-05

## 2021-10-05 MED ORDER — SODIUM CHLORIDE 0.9 % (FLUSH) INJECTION SYRINGE
3.0000 mL | INJECTION | INTRAMUSCULAR | Status: DC | PRN
Start: 2021-10-05 — End: 2021-10-05

## 2021-10-05 MED ORDER — SODIUM CHLORIDE 0.9 % (FLUSH) INJECTION SYRINGE
3.0000 mL | INJECTION | Freq: Three times a day (TID) | INTRAMUSCULAR | Status: DC
Start: 2021-10-05 — End: 2021-10-05
  Administered 2021-10-05 (×2): 3 mL

## 2021-10-05 MED ORDER — METOPROLOL SUCCINATE ER 25 MG TABLET,EXTENDED RELEASE 24 HR
50.0000 mg | ORAL_TABLET | Freq: Every day | ORAL | Status: DC
Start: 2021-10-05 — End: 2021-10-05
  Administered 2021-10-05: 50 mg via ORAL

## 2021-10-05 MED ORDER — ALBUTEROL SULFATE 2.5 MG/3 ML (0.083 %) SOLUTION FOR NEBULIZATION
2.5000 mg | INHALATION_SOLUTION | RESPIRATORY_TRACT | Status: DC | PRN
Start: 2021-10-05 — End: 2021-10-05

## 2021-10-05 MED ORDER — ACETAMINOPHEN 325 MG TABLET
ORAL_TABLET | ORAL | Status: AC
Start: 2021-10-05 — End: 2021-10-05
  Filled 2021-10-05: qty 2

## 2021-10-05 MED ORDER — HYDRALAZINE 20 MG/ML INJECTION SOLUTION
10.0000 mg | INTRAMUSCULAR | Status: DC | PRN
Start: 2021-10-05 — End: 2021-10-05

## 2021-10-05 MED ORDER — SODIUM CHLORIDE 0.9 % INTRAVENOUS PIGGYBACK
2.0000 g | INTRAVENOUS | Status: DC
Start: 2021-10-06 — End: 2021-10-05

## 2021-10-05 MED ORDER — LACTULOSE 10 GRAM/15 ML (15 ML) ORAL SOLUTION
30.0000 mL | Freq: Every day | ORAL | Status: DC | PRN
Start: 2021-10-05 — End: 2021-10-05

## 2021-10-05 MED ORDER — MORPHINE 2 MG/ML INJECTION WRAPPER
2.0000 mg | INJECTION | INTRAMUSCULAR | Status: DC | PRN
Start: 2021-10-05 — End: 2021-10-05

## 2021-10-05 MED ORDER — DIPHENHYDRAMINE 50 MG/ML INJECTION SOLUTION
25.0000 mg | Freq: Four times a day (QID) | INTRAMUSCULAR | Status: DC | PRN
Start: 2021-10-05 — End: 2021-10-05

## 2021-10-05 MED ORDER — ATORVASTATIN 40 MG TABLET
40.0000 mg | ORAL_TABLET | Freq: Every evening | ORAL | Status: DC
Start: 2021-10-05 — End: 2021-10-05

## 2021-10-05 MED ORDER — SODIUM CHLORIDE 0.9 % INTRAVENOUS PIGGYBACK
1.0000 g | INTRAVENOUS | Status: DC
Start: 2021-10-05 — End: 2021-10-05
  Administered 2021-10-05: 0 g via INTRAVENOUS
  Administered 2021-10-05: 1 g via INTRAVENOUS

## 2021-10-05 MED ORDER — BUDESONIDE 0.5 MG/2 ML SUSPENSION FOR NEBULIZATION
0.5000 mg | INHALATION_SUSPENSION | Freq: Two times a day (BID) | RESPIRATORY_TRACT | Status: DC
Start: 2021-10-05 — End: 2021-10-05
  Administered 2021-10-05: 0.5 mg via RESPIRATORY_TRACT

## 2021-10-05 MED ORDER — ISOSORBIDE MONONITRATE ER 30 MG TABLET,EXTENDED RELEASE 24 HR
ORAL_TABLET | ORAL | Status: AC
Start: 2021-10-05 — End: 2021-10-05
  Filled 2021-10-05: qty 1

## 2021-10-05 MED ORDER — CODEINE 10 MG-GUAIFENESIN 100 MG/5 ML ORAL LIQUID
10.0000 mL | ORAL | Status: DC | PRN
Start: 2021-10-05 — End: 2021-10-05

## 2021-10-05 MED ORDER — CEFTRIAXONE 1 GRAM SOLUTION FOR INJECTION
INTRAMUSCULAR | Status: AC
Start: 2021-10-05 — End: 2021-10-05
  Filled 2021-10-05: qty 10

## 2021-10-05 MED ORDER — PANTOPRAZOLE 40 MG TABLET,DELAYED RELEASE
DELAYED_RELEASE_TABLET | ORAL | Status: AC
Start: 2021-10-05 — End: 2021-10-05
  Filled 2021-10-05: qty 1

## 2021-10-05 MED ORDER — ONDANSETRON HCL (PF) 4 MG/2 ML INJECTION SOLUTION
4.0000 mg | Freq: Three times a day (TID) | INTRAMUSCULAR | Status: DC | PRN
Start: 2021-10-05 — End: 2021-10-05

## 2021-10-05 MED ORDER — METHYLPREDNISOLONE SOD SUCC 125 MG SOLUTION FOR INJECTION WRAPPER
125.0000 mg | Freq: Once | INTRAVENOUS | Status: AC
Start: 2021-10-05 — End: 2021-10-05
  Administered 2021-10-05: 125 mg via INTRAVENOUS

## 2021-10-05 MED ORDER — ALUMINUM-MAG HYDROXIDE-SIMETHICONE 200 MG-200 MG-20 MG/5 ML ORAL SUSP
15.0000 mL | Freq: Four times a day (QID) | ORAL | Status: DC | PRN
Start: 2021-10-05 — End: 2021-10-05

## 2021-10-05 MED ORDER — OXYCODONE 5 MG TABLET
5.0000 mg | ORAL_TABLET | ORAL | Status: DC | PRN
Start: 2021-10-05 — End: 2021-10-05

## 2021-10-05 MED ORDER — LORAZEPAM 2 MG/ML INJECTION WRAPPER
0.5000 mg | INTRAMUSCULAR | Status: DC | PRN
Start: 2021-10-05 — End: 2021-10-05

## 2021-10-05 MED ORDER — SODIUM CHLORIDE 0.9 % INTRAVENOUS PIGGYBACK
INJECTION | INTRAVENOUS | Status: AC
Start: 2021-10-05 — End: 2021-10-05
  Filled 2021-10-05: qty 50

## 2021-10-05 MED ORDER — NICOTINE 21 MG/24 HR DAILY TRANSDERMAL PATCH
21.0000 mg | MEDICATED_PATCH | TRANSDERMAL | Status: DC | PRN
Start: 2021-10-05 — End: 2021-10-05

## 2021-10-05 MED ORDER — GUAIFENESIN ER 600 MG TABLET, EXTENDED RELEASE 12 HR
EXTENDED_RELEASE_TABLET | ORAL | Status: AC
Start: 2021-10-05 — End: 2021-10-05
  Filled 2021-10-05: qty 1

## 2021-10-05 MED ORDER — GUAIFENESIN ER 600 MG TABLET, EXTENDED RELEASE 12 HR
600.0000 mg | EXTENDED_RELEASE_TABLET | Freq: Two times a day (BID) | ORAL | Status: DC
Start: 2021-10-05 — End: 2021-10-05
  Administered 2021-10-05: 600 mg via ORAL

## 2021-10-05 MED ORDER — ACETAMINOPHEN 325 MG TABLET
650.0000 mg | ORAL_TABLET | Freq: Four times a day (QID) | ORAL | Status: DC | PRN
Start: 2021-10-05 — End: 2021-10-05

## 2021-10-05 MED ORDER — POLYETHYLENE GLYCOL 3350 17 GRAM ORAL POWDER PACKET
17.0000 g | Freq: Two times a day (BID) | ORAL | Status: DC | PRN
Start: 2021-10-05 — End: 2021-10-05
  Filled 2021-10-05: qty 1

## 2021-10-05 NOTE — ED Nurses Note (Signed)
Transferred to hospital bed for comfort.  No complaints verbalized.

## 2021-10-05 NOTE — H&P (Signed)
[facility]  History and Physical    Date of Service:  10/05/2021  Caitlyn Gomez, Caitlyn Gomez, 46 y.o. female  Encounter Start Date:  10/04/2021  Inpatient Admission Date:   Date of Birth:  [birth year]  PCP: [facility]            Chief Complaint:  Shortness of breath, cough, wheezing    HPI: Caitlyn Gomez is a 46 y.o., White female who presented to the emergency department this past evening with a chief complaint of shortness of breath, cough and wheezing.  The patient was seen examined at bedside in the emergency department following discussion with the ED physician.  Patient tells me that she was in the emergency department and evaluated with similar symptoms, she was prescribed steroid pack, breathing treatments, as well as antimicrobial agent, she is initially she improved however she has worsened over the past week or so.  Her sister is present at her bedside.  They state tell me that there is multiple contacts in their family that have similar respiratory symptoms.  Patient is also having some hoarseness of her voice.  Patient reports she is having some chills but she is unsure whether she has had fever.  She reports that she is having quite a bit of cough, she reports associated wheezing as well as shortness of breath.  Patient does tell me she smokes, she currently smokes 1 pack per day.  She does tell me that she has COPD.  She has tried nebulized treatments at home with minimal improvement.  Patient denies any chest pain or chest pressure, denies any nausea or vomiting.  Denies any abdominal pain or abdominal discomfort at this time.    Past Medical History:    Past Medical History:   Diagnosis Date   . A-fib (CMS HCC)     Dr. Chauncey Cruel. Lucio Edward   . Abnormal Pap smear    . Anxiety    . Arthropathy    . Asthma    . Chronic obstructive airway disease (CMS HCC)    . Coronary artery disease    . Depression    . Dysplasia of cervix    . Dysrhythmias     a fib   . Ectopic pregnancy    . Fibromyalgia     . GERD (gastroesophageal reflux disease)     controlled with med   . History of anesthesia complications     aspirated for scope last time   . History of cervical cancer    . HTN (hypertension)    . Hyperlipidemia    . NAFLD (nonalcoholic fatty liver disease)    . Neck mass    . PCOS (polycystic ovarian syndrome)    . Smoking addiction    . Stroke (CMS [facility])     TIA- [place] - 2016 - (left facial droop and left arm weakness)   . Thyroid disorder     nodule   . Thyroid nodule    . Varicosities              Medications Prior to Admission     Prescriptions    aerochamber with flowsignal spacer (AEROCHAMBER) Inhaler    Use device with inhaler each time.    apixaban (ELIQUIS) 5 mg Oral Tablet    Take 5 mg by mouth Once a day    atorvastatin (LIPITOR) 40 mg Oral Tablet    Take 40 mg by mouth Every evening    bethanechol chloride (URECHOLINE) 5 mg  Oral Tablet    Take 1 Tablet by mouth Every night    citalopram (CELEXA) 20 mg Oral Tablet    Take 1 Tablet (20 mg total) by mouth Once a day    Patient not taking:  Reported on 04/30/2021    fluticasone propion-salmeteroL (ADVAIR) 230-21 mcg/actuation Inhalation oral inhaler    Take 2 Puffs by inhalation Twice daily    isosorbide mononitrate (IMDUR) 30 mg Oral Tablet Sustained Release 24 hr    Take 30 mg by mouth Every morning    metoprolol succinate (TOPROL-XL) 50 mg Oral Tablet Sustained Release 24 hr    Take 1 Tablet by mouth Once a day    nitroGLYCERIN (NITROSTAT) 0.3 mg Sublingual Tablet, Sublingual    Place 0.3 mg under the tongue Every 5 minutes as needed for Chest pain for 3 doses over 15 minutes    omeprazole (PRILOSEC) 40 mg Oral Capsule, Delayed Release(E.C.)    Take 1 Capsule (40 mg total) by mouth Twice daily    pantoprazole (PROTONIX) 40 mg Oral Tablet, Delayed Release (E.C.)    Take 1 Tablet (40 mg total) by mouth Twice daily Take on empty stomach, eat 30-60 mins later    predniSONE (DELTASONE) 10 mg Oral Tablet    Take 1 Tablet (10 mg total) by mouth Once a day     PROAIR HFA 90 mcg/actuation Inhalation oral inhaler    Take 1 Puff by inhalation Every 4 hours as needed        Allergies   Allergen Reactions   . Amoxicillin    . Sulfa (Sulfonamides)        Past Surgical History:  Past Surgical History:   Procedure Laterality Date   . HX APPENDECTOMY     . HX DILATION AND CURETTAGE     . HX ENDOMETRIAL ABLATION     . HX HEART CATHETERIZATION      stent   . HX HYSTERECTOMY     . HX TONSILLECTOMY     . HX TUBAL LIGATION             Family History:  Family Medical History:     Problem Relation (Age of Onset)    Colon Cancer Maternal Grandfather    Colon Polyps Maternal Grandmother    Diabetes Maternal Grandmother, Paternal 7    Healthy Mother    Heart Disease Father    Hypertension (High Blood Pressure) Father    Inflammatory Bowel Dz Maternal cousin    Stomach Cancer Maternal Aunt    Sudden Death no cause Other             Social History:  Social History     Tobacco Use   . Smoking status: Every Day     Packs/day: 1.00     Types: Cigarettes   . Smokeless tobacco: Never   Vaping Use   . Vaping Use: Never used   Substance Use Topics   . Alcohol use: Not Currently     Alcohol/week: 0.8 standard drinks     Types: 1 Glasses of wine per week     Comment: 2-3 times a month;   Marland Kitchen Drug use: Never        Review of Systems:  All systems are reviewed and are negative except those mentioned in the HPI portion    Examination:  BP (!) 147/90   Pulse (!) 102   Temp 36.9 C (98.4 F)   Resp (!) 24   Ht 1.676 m (  $'5\' 6"'S$ )   Wt 132 kg (290 lb)   LMP 09/07/2009   SpO2 99%   BMI 46.81 kg/m         General: Patient is alert and oriented to person, place and time she does appear acutely ill, coughing throughout the physical exam.  Obese body habitus.    HEENT: Pupils are of round shape, equal in size, and reactive to light bilaterally. Oral mucous membranes are moist.    Heart: S1 and S2 are present. No appreciable murmur.    Lungs:  Breath sounds diminished at all lung fields bilaterally, there  is wheezing appreciated all lung fields bilaterally.  No appreciable rhonchi.    Gastrointestinal: Bowel sounds are appreciated at all 4 quadrants. Abdomen is soft, not appreciably distended, non-tender to palpation at all quadrants.    Extremities: Radial pulses are 2/4 bilaterally, dorsalis pedis pulses are 2/4 bilaterally. Capillary refill is less than 3 seconds at distal digits bilaterally. No appreciable edema of the lower extremities.    Genitourinary: No appreciable suprapubic tenderness.    Neurologic: Follows commands appropriately. No appreciable facial droop. No appreciable focal weakness of the bilateral upper or lower extremities.    Skin: Grossly intact at observable areas.    Labs:    Lab Results Today:    Results for orders placed or performed during the hospital encounter of 10/04/21 (from the past 24 hour(s))   COMPREHENSIVE METABOLIC PANEL, NON-FASTING   Result Value Ref Range    SODIUM 140 136 - 145 mmol/L    POTASSIUM 3.4 (L) 3.5 - 5.1 mmol/L    CHLORIDE 107 98 - 107 mmol/L    CO2 TOTAL 25 21 - 31 mmol/L    ANION GAP 8 (L) 10 - 20 mmol/L    BUN 9 7 - 25 mg/dL    CREATININE 0.72 0.60 - 1.30 mg/dL    BUN/CREA RATIO 13 6 - 22    ESTIMATED GFR 106 >59 mL/min/1.10m2    ALBUMIN 3.8 3.5 - 5.7 g/dL    CALCIUM 8.4 (L) 8.6 - 10.3 mg/dL    GLUCOSE 96 74 - 109 mg/dL    ALKALINE PHOSPHATASE 76 34 - 104 U/L    ALT (SGPT) 20 7 - 52 U/L    AST (SGOT) 16 13 - 39 U/L    BILIRUBIN TOTAL 0.3 0.3 - 1.2 mg/dL    PROTEIN TOTAL 7.2 6.4 - 8.9 g/dL    ALBUMIN/GLOBULIN RATIO 1.1 0.8 - 1.4    OSMOLALITY, CALCULATED 278 270 - 290 mOsm/kg    CALCIUM, CORRECTED 8.6 (L) 8.9 - 10.8 mg/dL    GLOBULIN 3.4 2.9 - 5.4   LACTIC ACID LEVEL W/ REFLEX FOR LEVEL >2.0   Result Value Ref Range    LACTIC ACID 1.2 0.5 - 2.2 mmol/L   COVID-19, FLU A/B, RSV RAPID BY PCR   Result Value Ref Range    SARS-CoV-2 Not Detected Not Detected    INFLUENZA VIRUS TYPE A Not Detected Not Detected    INFLUENZA VIRUS TYPE B Not Detected Not Detected     RESPIRATORY SYNCTIAL VIRUS (RSV) Not Detected Not Detected       Imaging Studies: XR CHEST PA AND LATERAL  FINAL [facility]PORT  [facility]   Patient Name: HNAYELY, DINGUS Age: [age] Patient MR NO: [id number] Date Of Birth: 1Jan 22, 1977 Referring Doctor: AAndree ElkAccession No: [id number] Patient Location: Emergency Department         CLINICAL INDICATION: COUGH,  CONGESTION X 2 WEEKS, HX OF COPD, PT UNABLE TO RAISE LEFT ARM DUE TO RECENT ROTATOR CUFF SURGERY BEST POSSIBLE IMAGES  CONTRAST: No Contrast  PROCEDURE DATE: 10/04/2021    XRAY CHEST    TECHNIQUE: X-ray of the chest two views was performed per as low as reasonably achievable (ALARA) protocols.               COMPARISON: None.               HISTORY: Cough, congestion.                FINDINGS:               No focal airspace opacity, pleural effusion, or pneumothorax.                Normal cardiomediastinal silhouette.                There is a suture anchor in the left shoulder, related to rotator cuff surgery.               IMPRESSION:               1. Unremarkable chest radiograph, with no focal airspace opacity.                   To TALK to On Call [phone]           Patient Name:Brazzel, KIMARIE  MR QQ:[id number]  Accession [id number]    Karma Ganja M.D.  Electronically Signed    Date Of Exam Request:10/04/2021 9:45:20 PM EDT  Date & Time Of Report: 10/04/2021 10:18:50 PM EDT    Patient Name:Deacon, KERA  MR JA:[id number]  Accession [id number]       Assessment/Plan:   Active Hospital Problems    Diagnosis   . Primary Problem: Acute exacerbation of chronic obstructive pulmonary disease (COPD) (CMS HCC)   . GERD (gastroesophageal reflux disease)   . HTN (hypertension)   . Depression   . Chronic obstructive airway disease (CMS HCC)   . Smoking addiction     Patient is presenting with acute exacerbation of COPD, she has wheezing on physical examination, this is likely secondary to acute respiratory infection, patient will be started  on ceftriaxone 1 g IV every 24 hours, she has reported allergy to amoxicillin, she reports that she has had rash with this in the past, reviewed the patient's past medication administrations in [employer] expanse, she has received ceftriaxone IM without documented adverse reaction.  Patient also started on Solu-Medrol 125 mg IV x1 stat followed by 62.5 mg IV q.6 hours thereafter.  She will have Robitussin AC 5 mL p.o. every 6 hours as needed for cough and congestion.  Patient we can monitor on continuous pulse oximetry with supplemental oxygen to be titrated if needed.  Patient will also be started on DuoNeb 3 mL HHN q.4h.  We will order morning laboratory studies as well.  Patient reports history of atrial fibrillation for which she currently takes Eliquis 5 mg p.o. b.i.d., this will be continued for her, the remainder of her home medications to be resumed as appropriate as well.  Plan of care is discussed with her bedside, all of her questions were answered.  Further changes pending her clinical course.    DVT/PE Prophylaxis: Apixaban     Medical decision making for today's visit is of moderate level.    Dayton Bailiff, DO    Contents of the document, in whole or  in part, are completed utilizing M*Modal dictation technology, please forgive any typographical errors that may exist.

## 2021-10-05 NOTE — ED Nurses Note (Signed)
Resting quietly in room.  No complaints verbalized.  Breakfast that family gave patient taken.
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
## 2021-10-05 NOTE — Progress Notes (Signed)
[facility]  IP PROGRESS NOTE      Caitlyn Gomez, Caitlyn Gomez  Date of Admission:  10/04/2021  Date of Birth:  10/30/1975  Date of Service:  10/05/2021    Hospital Day:  LOS: 0 days     Chief Complaint: Shortness of breath   Subjective:   Caitlyn Gomez is a 46 y.o. female who is still in bed 4, ED. She is sleeping comfortably when I enter her room.  She does have supplemental oxygen on.  She wakes up easily to tell me that her oxygen dropped during the night.  She does admit that she is feeling a little better from a pulmonary standpoint.  She does have a lot of complaints concerning her left shoulder as apparently she has been dealing with complications from a rotator cuff surgery in December.    Vital Signs:  Temp (24hrs) Max:36.9 C (98.4 F)      Temperature: 36.9 C (98.4 F)  BP (Non-Invasive): 132/75  MAP (Non-Invasive): 89 mmHG  Heart Rate: 71  Respiratory Rate: 17  SpO2: 98 %    Current Medications:  acetaminophen (TYLENOL) tablet, 650 mg, Oral, Q6H PRN  albuterol (PROVENTIL) 2.5 mg / 3 mL (0.083%) neb solution, 2.5 mg, Nebulization, Q4H PRN  aluminum-magnesium hydroxide-simethicone (MAG-AL PLUS) 200-200-20 mg per 5 mL oral liquid, 15 mL, Oral, 4x/day PRN  apixaban (ELIQUIS) tablet, 5 mg, Oral, 2x/day  atorvastatin (LIPITOR) tablet, 40 mg, Oral, QPM  bethanechol (URECHOLINE) tablet, 6.25 mg, Oral, NIGHTLY  budesonide (PULMICORT RESPULES) 0.5 mg/2 mL nebulizer suspension, 0.5 mg, Nebulization, 2x/day  cefTRIAXone (ROCEPHIN) 1 g in NS 50 mL IVPB minibag, 1 g, Intravenous, Once  [START ON 10/06/2021] cefTRIAXone (ROCEPHIN) 2 g in NS 50 mL IVPB minibag, 2 g, Intravenous, Q24H  codeine-guaiFENesin (GUAIFENESIN AC) 10-100 mg per 5 mL oral liquid, 10 mL, Oral, Q4H PRN  diphenhydrAMINE (BENADRYL) 50 mg/mL injection, 25 mg, Intravenous, Q6H PRN  guaiFENesin (MUCINEX) extended release tablet - for cough (expectorant), 600 mg, Oral, 2x/day  hydrALAZINE (APRESOLINE) injection 10 mg, 10 mg, Intravenous, Q4H  PRN  ipratropium-albuterol 0.5 mg-3 mg(2.5 mg base)/3 mL Solution for Nebulization, 3 mL, Nebulization, Q4H  isosorbide mononitrate (IMDUR) 24 hr extended release tablet, 30 mg, Oral, QAM  lactulose (ENULOSE) 10g per 28m oral liquid, 30 mL, Oral, Daily PRN  LORazepam (ATIVAN) 2 mg/mL injection, 0.5 mg, Intravenous, Q4H PRN  methylPREDNISolone sod succ (SOLU-MEDROL) 125 mg/2 mL injection, 62.5 mg, Intravenous, Q6H  metoprolol succinate (TOPROL-XL) 24 hr extended release tablet, 50 mg, Oral, Daily  morphine 2 mg/mL injection, 2 mg, Intravenous, Q4H PRN  nicotine (NICODERM CQ) transdermal patch (mg/24 hr), 21 mg, Transdermal, Q24 H PRN  nitroGLYCERIN (NITROSTAT) sublingual tablet, 0.4 mg, Sublingual, Q5 Min PRN  NS flush syringe, 3 mL, Intracatheter, Q8HRS  NS flush syringe, 3 mL, Intracatheter, Q1H PRN  ondansetron (ZOFRAN) 2 mg/mL injection, 4 mg, Intravenous, Q8H PRN  oxyCODONE (ROXICODONE) immediate release tablet, 5 mg, Oral, Q4H PRN  pantoprazole (PROTONIX) delayed release tablet, 40 mg, Oral, 2x/day  polyethylene glycol (MIRALAX) oral packet, 17 g, Oral, 2x/day PRN  zolpidem (AMBIEN) tablet, 5 mg, Oral, HS PRN        Current Orders:  Active Orders   Lab    BASIC METABOLIC PANEL     Frequency: 0530 - AM DRAW     Number of Occurrences: 1 Occurrences   Diet    DIET CARDIAC (2G NA, LOWFAT, LOW CHOL) Do you want to initiate MNT Protocol? Yes  Frequency: All Meals     Number of Occurrences: 1 Occurrences   Nursing    ACTIVITY Activity: UP AD LIB     Frequency: UNTIL DISCONTINUED     Number of Occurrences: Until Specified    APIXABAN NURSING ORDER     Frequency: UNTIL DISCONTINUED     Number of Occurrences: Until Specified     Order Comments: Nursing Instructions:    1.  Print apixaban (Eliquis) guide using the link in this order.   2.  Nurse to provide apixaban patient guide to all patients and be sure that all new starts have received education by the trained anticoagulation educator.  If additional questions,  contact pharmacy.           MISCELLANEOUS MD/DO TO NURSE     Frequency: ONE TIME     Number of Occurrences: 1 Occurrences     Order Comments: Notify physician of CBC result when available, thank you.      NOTIFY MD     Frequency: PRN     Number of Occurrences: Until Specified    Notify MD Vital Signs     Frequency: PRN     Number of Occurrences: Until Specified    PT IS MEDIUM RISK FOR VENOUS THROMBOEMBOLISM     Frequency: CONTINUOUS     Number of Occurrences: Until Specified    PULSE OXIMETRY CONTINUOUS     Frequency: CONTINUOUS     Number of Occurrences: Until Specified    VITAL SIGNS Q4H     Frequency: Q4H     Number of Occurrences: Until Specified    WAS PATIENT ON APIXABAN PRIOR TO ADMISSION?     Frequency: UNTIL DISCONTINUED     Number of Occurrences: Until Specified    WEIGH PATIENT     Frequency: DAILY (0600)     Number of Occurrences: Until Specified   Respiratory Care    OXYGEN - NASAL CANNULA     Frequency: PRN     Number of Occurrences: Until Specified     Order Comments: Flowrate should not exeed 6L/min except [facility] facility.          IV    INSERT & MAINTAIN PERIPHERAL IV ACCESS     Frequency: UNTIL DISCONTINUED     Number of Occurrences: Until Specified    PERIPHERAL IV DRESSING CHANGE     Frequency: PRN     Number of Occurrences: Until Specified   Admission    PATIENT CLASS/LEVEL OF CARE DESIGNATION - PRN     Frequency: ONE TIME     Number of Occurrences: 1 Occurrences   Medications    acetaminophen (TYLENOL) tablet     Frequency: Q6H PRN     Dose: 650 mg     Route: Oral    albuterol (PROVENTIL) 2.5 mg / 3 mL (0.083%) neb solution     Frequency: Q4H PRN     Dose: 2.5 mg     Route: Nebulization    aluminum-magnesium hydroxide-simethicone (MAG-AL PLUS) 200-200-20 mg per 5 mL oral liquid     Frequency: 4x/day PRN     Dose: 15 mL     Route: Oral    apixaban (ELIQUIS) tablet     Frequency: 2x/day     Dose: 5 mg     Route: Oral    atorvastatin (LIPITOR) tablet     Frequency: QPM     Dose: 40 mg     Route:  Oral    bethanechol (URECHOLINE)  tablet     Frequency: NIGHTLY     Dose: 6.25 mg     Route: Oral     Order Comments: Therapeutic sub: bethanechol 5 mg tablet    budesonide (PULMICORT RESPULES) 0.5 mg/2 mL nebulizer suspension     Frequency: 2x/day     Dose: 0.5 mg     Route: Nebulization    cefTRIAXone (ROCEPHIN) 1 g in NS 50 mL IVPB minibag     Frequency: Once     Dose: 1 g     Route: Intravenous    cefTRIAXone (ROCEPHIN) 2 g in NS 50 mL IVPB minibag     Frequency: Q24H     Dose: 2 g     Route: Intravenous    codeine-guaiFENesin (GUAIFENESIN AC) 10-100 mg per 5 mL oral liquid     Frequency: Q4H PRN     Dose: 10 mL     Route: Oral    diphenhydrAMINE (BENADRYL) 50 mg/mL injection     Frequency: Q6H PRN     Dose: 25 mg     Route: Intravenous    guaiFENesin (MUCINEX) extended release tablet - for cough (expectorant)     Frequency: 2x/day     Dose: 600 mg     Route: Oral    hydrALAZINE (APRESOLINE) injection 10 mg     Frequency: Q4H PRN     Dose: 10 mg     Route: Intravenous    ipratropium-albuterol 0.5 mg-3 mg(2.5 mg base)/3 mL Solution for Nebulization     Frequency: Q4H     Dose: 3 mL     Route: Nebulization    isosorbide mononitrate (IMDUR) 24 hr extended release tablet     Frequency: QAM     Dose: 30 mg     Route: Oral    lactulose (ENULOSE) 10g per 49m oral liquid     Frequency: Daily PRN     Dose: 30 mL     Route: Oral    LORazepam (ATIVAN) 2 mg/mL injection     Frequency: Q4H PRN     Dose: 0.5 mg     Route: Intravenous    methylPREDNISolone sod succ (SOLU-MEDROL) 125 mg/2 mL injection     Frequency: Q6H     Dose: 62.5 mg     Route: Intravenous    metoprolol succinate (TOPROL-XL) 24 hr extended release tablet     Frequency: Daily     Dose: 50 mg     Route: Oral    morphine 2 mg/mL injection     Frequency: Q4H PRN     Dose: 2 mg     Route: Intravenous    nicotine (NICODERM CQ) transdermal patch (mg/24 hr)     Frequency: Q24 H PRN     Dose: 21 mg     Route: Transdermal    nitroGLYCERIN (NITROSTAT) sublingual  tablet     Frequency: Q5 Min PRN     Dose: 0.4 mg     Route: Sublingual    NS flush syringe     Frequency: Q8HRS     Dose: 3 mL     Route: Intracatheter    NS flush syringe     Frequency: Q1H PRN     Dose: 3 mL     Route: Intracatheter    ondansetron (ZOFRAN) 2 mg/mL injection     Frequency: Q8H PRN     Dose: 4 mg     Route: Intravenous    oxyCODONE (ROXICODONE) immediate release tablet  Frequency: Q4H PRN     Dose: 5 mg     Route: Oral    pantoprazole (PROTONIX) delayed release tablet     Frequency: 2x/day     Dose: 40 mg     Route: Oral    polyethylene glycol (MIRALAX) oral packet     Frequency: 2x/day PRN     Dose: 17 g     Route: Oral    zolpidem (AMBIEN) tablet     Frequency: HS PRN     Dose: 5 mg     Route: Oral        Review of Systems:  Focused review of system was completed. Refer to the HPI for ROS details.     Today's Physical Exam:  GENERAL: Pleasant, cooperative. Alert and oriented x3     SKIN:  Warm and dry.  HEAD:  Atraumatic, normocephalic.   EYES:  Pupils equal and reactive.  EOMI.    NECK:  Supple, Trachea midline, no lymphadenopathy.   CARDIAC:  S1 and S2, no murmurs or gallops.  No S3.    LUNGS:  Decreased air movement.  Wheezes noted bilaterally.  No rhonchi appreciated  GI:  Abdomen soft, nontender.  Good bowel sounds x 4.  No organomegaly appreciated.   GU:   No suprapubic tenderness.  No costovertebral tenderness.  MUSCULOSKELETAL:  Significant weakness and limited range of motion left upper extremity.  EXTREMITIES:  No pedal edema noted.  Distal pulses equal bilaterally.  NEUROLOGICAL:  Cranial nerves grossly intact.  No gross focal deficit.   PSYCHIATRIC:   Appropriate mood and affect.      I/O:  I/O last 24 hours:  No intake or output data in the 24 hours ending 10/05/21 1124  I/O current shift:  No intake/output data recorded.    Labs  Please indicate ordered or reviewed)  Reviewed:   Results for orders placed or performed during the hospital encounter of 10/04/21 (from the past 24  hour(s))   CBC/DIFF    Narrative    The following orders were created for panel order CBC/DIFF.  Procedure                               Abnormality         Status                     ---------                               -----------         ------                     CBC WITH [id number][[id number]]                Abnormal            Final result                 Please view results for these tests on the individual orders.   COMPREHENSIVE METABOLIC PANEL, NON-FASTING   Result Value Ref Range    SODIUM 140 136 - 145 mmol/L    POTASSIUM 3.4 (L) 3.5 - 5.1 mmol/L    CHLORIDE 107 98 - 107 mmol/L    CO2 TOTAL 25 21 - 31 mmol/L    ANION GAP 8 (L) 10 - 20 mmol/L  BUN 9 7 - 25 mg/dL    CREATININE 0.72 0.60 - 1.30 mg/dL    BUN/CREA RATIO 13 6 - 22    ESTIMATED GFR 106 >59 mL/min/1.24m2    ALBUMIN 3.8 3.5 - 5.7 g/dL    CALCIUM 8.4 (L) 8.6 - 10.3 mg/dL    GLUCOSE 96 74 - 109 mg/dL    ALKALINE PHOSPHATASE 76 34 - 104 U/L    ALT (SGPT) 20 7 - 52 U/L    AST (SGOT) 16 13 - 39 U/L    BILIRUBIN TOTAL 0.3 0.3 - 1.2 mg/dL    PROTEIN TOTAL 7.2 6.4 - 8.9 g/dL    ALBUMIN/GLOBULIN RATIO 1.1 0.8 - 1.4    OSMOLALITY, CALCULATED 278 270 - 290 mOsm/kg    CALCIUM, CORRECTED 8.6 (L) 8.9 - 10.8 mg/dL    GLOBULIN 3.4 2.9 - 5.4    Narrative    Estimated Glomerular Filtration Rate (eGFR) is calculated using the CKD-EPI (2021) equation, intended for patients 157years of age and older. If gender is not documented or "unknown", there will be no eGFR calculation.   LACTIC ACID LEVEL W/ REFLEX FOR LEVEL >2.0   Result Value Ref Range    LACTIC ACID 1.2 0.5 - 2.2 mmol/L   COVID-19, FLU A/B, RSV RAPID BY PCR   Result Value Ref Range    SARS-CoV-2 Not Detected Not Detected    INFLUENZA VIRUS TYPE A Not Detected Not Detected    INFLUENZA VIRUS TYPE B Not Detected Not Detected    RESPIRATORY SYNCTIAL VIRUS (RSV) Not Detected Not Detected    Narrative    Results are for the simultaneous qualitative identification of SARS-CoV-2 (formerly 2019-nCoV), Influenza A,  Influenza B, and RSV RNA. These etiologic agents are generally detectable in nasopharyngeal and nasal swabs during the ACUTE PHASE of infection. Hence, this test is intended to be performed on respiratory specimens collected from individuals with signs and symptoms of upper respiratory tract infection who meet Centers for Disease Control and Prevention (CDC) clinical and/or epidemiological criteria for Coronavirus Disease 2019 (COVID-19) testing. CDC COVID-19 criteria for testing on human specimens is available at [facility]webpage information for Healthcare Professionals: Coronavirus Disease 2019 (COVID-19) ([email or web address].     False-negative results may occur if the virus has genomic mutations, insertions, deletions, or rearrangements or if performed very early in the course of illness. Otherwise, negative results indicate virus specific RNA targets are not detected, however negative results do not preclude SARS-CoV-2 infection/COVID-19, Influenza, or Respiratory syncytial virus infection. Results should not be used as the sole basis for patient management decisions. Negative results must be combined with clinical observations, patient history, and epidemiological information. If upper respiratory tract infection is still suspected based on exposure history together with other clinical findings, re-testing should be considered.    Disclaimer:   This assay has been authorized by FDA under an Emergency Use Authorization for use in laboratories certified under the Clinical Laboratory Improvement Amendments of 1988 (CLIA), 42 U.S.C. 2629-681-1094 to perform high complexity tests. The impacts of vaccines, antiviral therapeutics, antibiotics, chemotherapeutic or immunosuppressant drugs have not been evaluated.     Test methodology:   Cepheid Xpert Xpress SARS-CoV-2/Flu/RSV Assay real-time polymerase chain reaction (RT-PCR) test on the GeneXpert Dx and Xpert Xpress systems.   CBC WITH DIFF    Result Value Ref Range    WBCS UNCORRECTED 9.7 x10^3/uL    WBC 9.7 4.0 - 10.5 x10^3/uL    RBC 4.41 4.20 - 5.40 x10^6/uL  HGB 12.6 12.5 - 16.0 g/dL    HCT 37.8 37.0 - 47.0 %    MCV 85.7 78.0 - 99.0 fL    MCH 28.6 27.0 - 32.0 pg    MCHC 33.4 32.0 - 36.0 g/dL    RDW 14.2 11.6 - 14.8 %    PLATELETS 258 140 - 440 x10^3/uL    MPV 9.4 7.4 - 10.4 fL    NEUTROPHIL % 82 (H) 40 - 76 %    LYMPHOCYTE % 9 (L) 25 - 45 %    MONOCYTE % 7 0 - 12 %    EOSINOPHIL % 0 0 - 7 %    BASOPHIL % 1 0 - 3 %    NEUTROPHIL # 8.00 4.10 - 29.00 x10^3/uL    LYMPHOCYTE # 0.90 (L) 2.10 - 11.00 x10^3/uL    MONOCYTE # 0.70 0.00 - 4.10 x10^3/uL    EOSINOPHIL # 0.00 0.00 - 2.40 x10^3/uL    BASOPHIL # 0.10 0.00 - 2.50 x10^3/uL   CBC/DIFF    Narrative    The following orders were created for panel order CBC/DIFF.  Procedure                               Abnormality         Status                     ---------                               -----------         ------                     CBC WITH BDZH[[id number]]                Abnormal            Final result                 Please view results for these tests on the individual orders.   BASIC METABOLIC PANEL   Result Value Ref Range    SODIUM 138 136 - 145 mmol/L    POTASSIUM 4.4 3.5 - 5.1 mmol/L    CHLORIDE 107 98 - 107 mmol/L    CO2 TOTAL 23 21 - 31 mmol/L    ANION GAP 8 (L) 10 - 20 mmol/L    CALCIUM 8.6 8.6 - 10.3 mg/dL    GLUCOSE 137 (H) 74 - 109 mg/dL    BUN 9 7 - 25 mg/dL    CREATININE 0.64 0.60 - 1.30 mg/dL    BUN/CREA RATIO 14 6 - 22    ESTIMATED GFR 112 >59 mL/min/1.28m2    OSMOLALITY, CALCULATED 277 270 - 290 mOsm/kg    Narrative    Estimated Glomerular Filtration Rate (eGFR) is calculated using the CKD-EPI (2021) equation, intended for patients 192years of age and older. If gender is not documented or "unknown", there will be no eGFR calculation.   CBC WITH DIFF   Result Value Ref Range    WBCS UNCORRECTED 6.9 x10^3/uL    WBC 6.9 4.0 - 10.5 x10^3/uL    RBC 4.45 4.20 - 5.40 x10^6/uL    HGB 12.5  12.5 - 16.0 g/dL    HCT 37.7 37.0 - 47.0 %    MCV 84.7 78.0 - 99.0 fL  MCH 28.0 27.0 - 32.0 pg    MCHC 33.1 32.0 - 36.0 g/dL    RDW 14.0 11.6 - 14.8 %    PLATELETS 224 140 - 440 x10^3/uL    MPV 9.0 7.4 - 10.4 fL    NEUTROPHIL % 91 (H) 40 - 76 %    LYMPHOCYTE % 8 (L) 25 - 45 %    MONOCYTE % 1 0 - 12 %    EOSINOPHIL % 0 0 - 7 %    BASOPHIL % 0 0 - 3 %    NEUTROPHIL # 6.30 4.10 - 29.00 x10^3/uL    LYMPHOCYTE # 0.50 (L) 2.10 - 11.00 x10^3/uL    MONOCYTE # 0.10 0.00 - 4.10 x10^3/uL    EOSINOPHIL # 0.00 0.00 - 2.40 x10^3/uL    BASOPHIL # 0.00 0.00 - 2.50 x10^3/uL    RBC COMMENT SLT ANISO     PLATELET COMMENT NORMAL           Radiology Tests (Please indicate ordered or reviewed)  Reviewed:   Results for orders placed or performed during the hospital encounter of 10/04/21 (from the past 24 hour(s))   XR CHEST PA AND LATERAL     Status: None    Narrative    FINAL REPORT  [facility]      Patient Name: GENEVE, KIMPEL  Age: 7  Patient MR NO: [id number]  Date Of Birth: [birth year]  Referring Doctor: Andree Elk Accession No: [id number]  Patient Location: Emergency Department         CLINICAL INDICATION: COUGH, CONGESTION X 2 WEEKS, HX OF COPD, PT UNABLE TO RAISE LEFT ARM DUE TO RECENT ROTATOR CUFF SURGERY BEST POSSIBLE IMAGES  CONTRAST: No Contrast  PROCEDURE DATE: 10/04/2021    XRAY CHEST    TECHNIQUE: X-ray of the chest two views was performed per as low as reasonably achievable (ALARA) protocols.               COMPARISON: None.               HISTORY: Cough, congestion.                FINDINGS:               No focal airspace opacity, pleural effusion, or pneumothorax.                Normal cardiomediastinal silhouette.                There is a suture anchor in the left shoulder, related to rotator cuff surgery.               IMPRESSION:               1. Unremarkable chest radiograph, with no focal airspace opacity.                   To TALK to On Call [phone]           Patient Name:Kistner,  JACYNDA  MR BB:[id number]  Accession [id number]    Karma Ganja M.D.  Electronically Signed      Date Of Exam Request:10/04/2021 9:45:20 PM EDT  Date & Time Of Report: 10/04/2021 10:18:50 PM EDT    Patient Name:Agent, GEARLDENE  MR KJ:[id number]  Accession [id number]             Problem List:  Active Hospital Problems   (*Primary Problem)    Diagnosis   . *Acute exacerbation of chronic  obstructive pulmonary disease (COPD) (CMS HCC)   . GERD (gastroesophageal reflux disease)     controlled with med     . HTN (hypertension)   . Depression   . Chronic obstructive airway disease (CMS HCC)   . Smoking addiction       Assessment/ Plan: Continue IV antibiotics, and IV steroids/nebs.  Pain of left shoulder seems appropriately managed at this time. (Encouraged PT exercises she has learned from her PT)  Continue maintenance medications  Repeat labs in a.m.Arline Asp, PA-C      DVT/PE Prophylaxis: Apixaban

## 2021-10-05 NOTE — ED Nurses Note (Signed)
Visiting with family members at present.  No complaints voiced.

## 2021-10-05 NOTE — ED Nurses Note (Signed)
Pt. Very agitated stating that she is angry people are getting beds except for her. She states " I have been being treating poorly, because I don't have a bed yet and I think it is due my insurance". Pt. Requesting to speak to nursing supervisor. Nursing supervisor called.

## 2021-10-07 ENCOUNTER — Encounter (INDEPENDENT_AMBULATORY_CARE_PROVIDER_SITE_OTHER): Payer: Self-pay | Admitting: NURSE PRACTITIONER

## 2021-10-14 ENCOUNTER — Other Ambulatory Visit: Payer: Self-pay

## 2021-10-14 ENCOUNTER — Ambulatory Visit (INDEPENDENT_AMBULATORY_CARE_PROVIDER_SITE_OTHER): Payer: Medicaid Other | Admitting: NURSE PRACTITIONER

## 2021-10-14 ENCOUNTER — Encounter (INDEPENDENT_AMBULATORY_CARE_PROVIDER_SITE_OTHER): Payer: Self-pay | Admitting: NURSE PRACTITIONER

## 2021-10-14 VITALS — Ht 66.0 in | Wt 290.0 lb

## 2021-10-14 DIAGNOSIS — E041 Nontoxic single thyroid nodule: Secondary | ICD-10-CM

## 2021-10-14 DIAGNOSIS — H903 Sensorineural hearing loss, bilateral: Secondary | ICD-10-CM

## 2021-10-14 DIAGNOSIS — Z6841 Body Mass Index (BMI) 40.0 and over, adult: Secondary | ICD-10-CM

## 2021-10-14 DIAGNOSIS — H9311 Tinnitus, right ear: Secondary | ICD-10-CM

## 2021-10-14 DIAGNOSIS — R42 Dizziness and giddiness: Secondary | ICD-10-CM

## 2021-10-14 NOTE — H&P (Signed)
ENT, [place]  [place] [address]  Phone: [phone]  Fax: [phone]      Encounter Date: 10/14/2021    Patient ID: Caitlyn Gomez  MRN: [id number]    DOB: Jan 23, 1976  Age: 46 y.o. female     Progress Note       Referring Provider:  [facility]    Reason for Visit:   Chief Complaint   Patient presents with   . Hearing Problem     R ear x 1 mo   . Ringing in Ear     R ear x 1 mo        History of Present Illness:  Caitlyn Gomez is a 46 y.o. female follow up asymmetric SNHL.  Last audio was [facility] July 2021 and showed mild SNHL left ear and moderate to severe SNHL right ear.  She reports popping in the right ear and now has almost no hearing in the right ear.  She is also is having some intermittent dizziness.  She also has history of thyroid nodule and last ultrasound was almost 2 years ago.      Patient History:  Patient Active Problem List   Diagnosis   . S/P endometrial ablation   . Chiari malformation type I (CMS [place])   . Acute exacerbation of chronic obstructive pulmonary disease (COPD) (CMS HCC)   . GERD (gastroesophageal reflux disease)   . HTN (hypertension)   . Depression   . Chronic obstructive airway disease (CMS HCC)   . Smoking addiction     Current Outpatient Medications   Medication Sig   . apixaban (ELIQUIS) 5 mg Oral Tablet Take 1 Tablet (5 mg total) by mouth Twice daily   . atorvastatin (LIPITOR) 40 mg Oral Tablet Take 1 Tablet (40 mg total) by mouth Every evening   . bethanechol chloride (URECHOLINE) 5 mg Oral Tablet Take 1 Tablet (5 mg total) by mouth Every night   . citalopram (CELEXA) 20 mg Oral Tablet Take 1 Tablet (20 mg total) by mouth Once a day (Patient not taking: Reported on 04/30/2021)   . fluticasone propion-salmeteroL (ADVAIR) 230-21 mcg/actuation Inhalation oral inhaler Take 2 Puffs by inhalation Twice daily   . metoprolol succinate (TOPROL-XL) 50 mg Oral Tablet Sustained Release 24 hr Take 1 Tablet (50 mg total) by mouth Once  a day   . nitroGLYCERIN (NITROSTAT) 0.3 mg Sublingual Tablet, Sublingual Place 1 Tablet (0.3 mg total) under the tongue Every 5 minutes as needed for Chest pain for 3 doses over 15 minutes   . omeprazole (PRILOSEC) 40 mg Oral Capsule, Delayed Release(E.C.) Take 1 Capsule (40 mg total) by mouth Twice daily (Patient taking differently: Take 1 Capsule (40 mg total) by mouth Once a day)   . pantoprazole (PROTONIX) 40 mg Oral Tablet, Delayed Release (E.C.) Take 1 Tablet (40 mg total) by mouth Twice daily Take on empty stomach, eat 30-60 mins later   . PROAIR HFA 90 mcg/actuation Inhalation oral inhaler Take 1 Puff by inhalation Every 4 hours as needed      Allergies   Allergen Reactions   . Hymenoptera Allergenic Extract      Other reaction(s): UNKNOWN   . Penicillins Hives/ Urticaria and Rash     Other reaction(s): Rash, Skin Irritation  Rash and swellling     . Amoxicillin    . Morphine      Other reaction(s): ARM SWELLED UPON INJECTION, Rash   . Sulfa (Sulfonamides)  Past Medical History:   Diagnosis Date   . A-fib (CMS HCC)     Dr. Chauncey Cruel. Lucio Edward   . Abnormal Pap smear    . Anxiety    . Arthropathy    . Asthma    . Chronic obstructive airway disease (CMS HCC)    . Coronary artery disease    . Depression    . Dysplasia of cervix    . Dysrhythmias     a fib   . Ectopic pregnancy    . Fibromyalgia    . GERD (gastroesophageal reflux disease)     controlled with med   . History of anesthesia complications     aspirated for scope last time   . History of cervical cancer    . HTN (hypertension)    . Hyperlipidemia    . NAFLD (nonalcoholic fatty liver disease)    . Neck mass    . PCOS (polycystic ovarian syndrome)    . Smoking addiction    . Stroke (CMS [facility])     TIA- [place] - 2016 - (left facial droop and left arm weakness)   . Thyroid disorder     nodule   . Thyroid nodule    . Varicosities      Past Surgical History:   Procedure Laterality Date   . HX APPENDECTOMY     . HX DILATION AND CURETTAGE     . HX ENDOMETRIAL ABLATION      . HX HEART CATHETERIZATION      stent   . HX HYSTERECTOMY     . HX TONSILLECTOMY     . HX TUBAL LIGATION       Family Medical History:     Problem Relation (Age of Onset)    Colon Cancer Maternal Grandfather    Colon Polyps Maternal Grandmother    Diabetes Maternal Grandmother, Paternal 47    Healthy Mother    Heart Disease Father    Hypertension (High Blood Pressure) Father    Inflammatory Bowel Dz Maternal cousin    Stomach Cancer Maternal Aunt    Sudden Death no cause Other          Social History     Tobacco Use   . Smoking status: Every Day     Packs/day: 1.00     Types: Cigarettes   . Smokeless tobacco: Never   Vaping Use   . Vaping Use: Never used   Substance Use Topics   . Alcohol use: Not Currently     Alcohol/week: 0.8 standard drinks     Types: 1 Glasses of wine per week     Comment: 2-3 times a month;   Marland Kitchen Drug use: Never       Review of Systems   Constitutional: Negative.    HENT: Positive for hearing loss and tinnitus.    Eyes: Negative.    Respiratory: Negative.    Endocrine: Negative.    Allergic/Immunologic: Negative.    Neurological: Positive for headaches.   Hematological: Negative.    Psychiatric/Behavioral: Negative.         Physical Exam:  Ht 1.676 m ('5\' 6"'$ )   Wt 132 kg (290 lb)   LMP 09/07/2009   BMI 46.81 kg/m       GENERAL APPEARANCE  Morbidly obese  MOOD AND AFFECT  Normal  COMMUNICATION  Normal  NEURO EXAM  Cranial II-XII intact, grossly intact and alert and oriented x3  HEAD & NECK  Lesions:  No  Scars:  No  Normal parotid gland:  Yes  Normal submandibular gland:  Yes  TMJ:  Normal  Neck mass:  No  Neck tenderness:  No  Lymphadenopathy:  No  Thyroid normal:  Yes  NOSE  Normal to inspection  ORAL CAVITY  Normal gums, lips, teeth:  edentulous  Tonsils: 1+ bilateral  Oropharynx normal:  Yes  RIGHT EAR  Normal to inspection:  Yes  Tympanic membrane:  Intact  External ears normal:  Yes  Internal canal normal:  Normal  LEFT EAR  Normal to inspection:  Yes  Tympanic membrane:  Intact with  sclerosis  External ears normal:  Yes  Internal canal normal:  Normal    Assessment:  ENCOUNTER DIAGNOSES     ICD-10-CM   1. 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 Tinnitus of right ear  H93.11   3. Thyroid nodule  E04.1   4. Vertigo  R42       Plan:  Medical records reviewed on 10/14/2021.  Audiogram scheduled for this week due t sudden onset hearing loss right ear.  May order vestibular testing pending audiogram findings.  Reviewed MRI report 09/09/20 Chiari I Malformation is stable.  She is continuing to follow up with neurosurgery.  Repeat thyroid ultrasound ordered      Orders Placed This Encounter   . US THYROID   . AMB PRN REFERRAL EXTERNAL AUDIOLOGIST     Return for after thyroid ultrasound.    Emiliano Dyer, FNP-BC  10/14/2021, 08:53

## 2021-10-15 ENCOUNTER — Encounter (INDEPENDENT_AMBULATORY_CARE_PROVIDER_SITE_OTHER): Payer: Self-pay | Admitting: NURSE PRACTITIONER

## 2021-10-15 ENCOUNTER — Ambulatory Visit (INDEPENDENT_AMBULATORY_CARE_PROVIDER_SITE_OTHER): Payer: Medicaid Other | Admitting: NURSE PRACTITIONER

## 2021-10-15 ENCOUNTER — Other Ambulatory Visit: Payer: Self-pay

## 2021-10-15 VITALS — Ht 66.0 in | Wt 290.0 lb

## 2021-10-15 DIAGNOSIS — H9311 Tinnitus, right ear: Secondary | ICD-10-CM

## 2021-10-15 DIAGNOSIS — H903 Sensorineural hearing loss, bilateral: Secondary | ICD-10-CM

## 2021-10-15 DIAGNOSIS — Z6841 Body Mass Index (BMI) 40.0 and over, adult: Secondary | ICD-10-CM

## 2021-10-15 MED ORDER — PREDNISONE 10 MG TABLET
ORAL_TABLET | ORAL | 0 refills | Status: DC
Start: 2021-10-15 — End: 2021-10-16
  Filled 2021-10-15: qty 62, 24d supply, fill #0

## 2021-10-15 NOTE — H&P (Signed)
ENT, [place]  [place] [address]  Phone: [phone]  Fax: [phone]      Encounter Date: 10/15/2021    Patient ID: Caitlyn Gomez  MRN: [id number]    DOB: 1975/10/29  Age: 46 y.o. female     Progress Note       Referring Provider:  [facility]    Reason for Visit:   Chief Complaint   Patient presents with   . Follow-up After Testing     Rc after audio, no new complaints        History of Present Illness:  10/15/21:  Follow up after repeat audio done at [facility] today.      10/14/21:  Caitlyn Gomez is a 46 y.o. female follow up asymmetric SNHL.  Last audio was [facility] July 2021 and showed mild SNHL left ear and moderate to severe SNHL right ear.  She reports popping in the right ear and now has almost no hearing in the right ear.  She is also is having some intermittent dizziness.  She also has history of thyroid nodule and last ultrasound was almost 2 years ago.      Patient History:  Patient Active Problem List   Diagnosis   . S/P endometrial ablation   . Chiari malformation type I (CMS [place])   . Acute exacerbation of chronic obstructive pulmonary disease (COPD) (CMS HCC)   . GERD (gastroesophageal reflux disease)   . HTN (hypertension)   . Depression   . Chronic obstructive airway disease (CMS HCC)   . Smoking addiction     Current Outpatient Medications   Medication Sig   . apixaban (ELIQUIS) 5 mg Oral Tablet Take 1 Tablet (5 mg total) by mouth Twice daily   . atorvastatin (LIPITOR) 40 mg Oral Tablet Take 1 Tablet (40 mg total) by mouth Every evening   . bethanechol chloride (URECHOLINE) 5 mg Oral Tablet Take 1 Tablet (5 mg total) by mouth Every night   . citalopram (CELEXA) 20 mg Oral Tablet Take 1 Tablet (20 mg total) by mouth Once a day (Patient not taking: Reported on 04/30/2021)   . fluticasone propion-salmeteroL (ADVAIR) 230-21 mcg/actuation Inhalation oral inhaler Take 2 Puffs by inhalation Twice daily   . metoprolol succinate (TOPROL-XL) 50 mg Oral  Tablet Sustained Release 24 hr Take 1 Tablet (50 mg total) by mouth Once a day   . nitroGLYCERIN (NITROSTAT) 0.3 mg Sublingual Tablet, Sublingual Place 1 Tablet (0.3 mg total) under the tongue Every 5 minutes as needed for Chest pain for 3 doses over 15 minutes   . omeprazole (PRILOSEC) 40 mg Oral Capsule, Delayed Release(E.C.) Take 1 Capsule (40 mg total) by mouth Twice daily (Patient taking differently: Take 1 Capsule (40 mg total) by mouth Once a day)   . pantoprazole (PROTONIX) 40 mg Oral Tablet, Delayed Release (E.C.) Take 1 Tablet (40 mg total) by mouth Twice daily Take on empty stomach, eat 30-60 mins later   . predniSONE (DELTASONE) 10 mg Oral Tablet Take 5 Tablets (50 mg) daily for 4 days, then 4 Tablets (40 mg) daily for 4 days, then 3 Tablets (30 mg) daily for 4 days, then 2 Tablets (20 mg) daily for 4 days, then 1 Tablet (10 mg) daily for 4 days, Then 1/2 Tablet ('5mg'$ ) daily for 4 days   . PROAIR HFA 90 mcg/actuation Inhalation oral inhaler Take 1 Puff by inhalation Every 4 hours as needed      Allergies  Allergen Reactions   . Hymenoptera Allergenic Extract      Other reaction(s): UNKNOWN   . Penicillins Hives/ Urticaria and Rash     Other reaction(s): Rash, Skin Irritation  Rash and swellling     . Amoxicillin    . Morphine      Other reaction(s): ARM SWELLED UPON INJECTION, Rash   . Sulfa (Sulfonamides)      Past Medical History:   Diagnosis Date   . A-fib (CMS HCC)     Dr. Chauncey Cruel. Lucio Edward   . Abnormal Pap smear    . Anxiety    . Arthropathy    . Asthma    . Chronic obstructive airway disease (CMS HCC)    . Coronary artery disease    . Depression    . Dysplasia of cervix    . Dysrhythmias     a fib   . Ectopic pregnancy    . Fibromyalgia    . GERD (gastroesophageal reflux disease)     controlled with med   . History of anesthesia complications     aspirated for scope last time   . History of cervical cancer    . HTN (hypertension)    . Hyperlipidemia    . NAFLD (nonalcoholic fatty liver disease)    . Neck  mass    . PCOS (polycystic ovarian syndrome)    . Smoking addiction    . Stroke (CMS [facility])     TIA- [place] - 2016 - (left facial droop and left arm weakness)   . Thyroid disorder     nodule   . Thyroid nodule    . Varicosities      Past Surgical History:   Procedure Laterality Date   . HX APPENDECTOMY     . HX DILATION AND CURETTAGE     . HX ENDOMETRIAL ABLATION     . HX HEART CATHETERIZATION      stent   . HX HYSTERECTOMY     . HX TONSILLECTOMY     . HX TUBAL LIGATION       Family Medical History:     Problem Relation (Age of Onset)    Colon Cancer Maternal Grandfather    Colon Polyps Maternal Grandmother    Diabetes Maternal Grandmother, Paternal 83    Healthy Mother    Heart Disease Father    Hypertension (High Blood Pressure) Father    Inflammatory Bowel Dz Maternal cousin    Stomach Cancer Maternal Aunt    Sudden Death no cause Other          Social History     Tobacco Use   . Smoking status: Every Day     Packs/day: 1.00     Types: Cigarettes   . Smokeless tobacco: Never   Vaping Use   . Vaping Use: Never used   Substance Use Topics   . Alcohol use: Not Currently     Alcohol/week: 0.8 standard drinks     Types: 1 Glasses of wine per week     Comment: 2-3 times a month;   Marland Kitchen Drug use: Never       Review of Systems   Constitutional: Negative.    HENT: Positive for hearing loss and tinnitus.    Eyes: Negative.    Respiratory: Negative.    Endocrine: Negative.    Allergic/Immunologic: Negative.    Neurological: Positive for headaches.   Hematological: Negative.    Psychiatric/Behavioral: Negative.  Physical Exam:  Ht 1.676 m ('5\' 6"'$ )   Wt 132 kg (290 lb)   LMP 09/07/2009   BMI 46.81 kg/m       GENERAL APPEARANCE  Morbidly obese  MOOD AND AFFECT  Normal  COMMUNICATION  Normal  NEURO EXAM  Cranial II-XII intact, grossly intact and alert and oriented x3  HEAD & NECK  Lesions:  No  Scars:  No  Normal parotid gland:  Yes  Normal submandibular gland:  Yes  TMJ:  Normal  Neck mass:  No  Neck tenderness:   No  Lymphadenopathy:  No  Thyroid normal:  Yes  NOSE  Normal to inspection  ORAL CAVITY  Normal gums, lips, teeth:  edentulous  Tonsils: 1+ bilateral  Oropharynx normal:  Yes  RIGHT EAR  Normal to inspection:  Yes  Tympanic membrane:  Intact  External ears normal:  Yes  Internal canal normal:  Normal  LEFT EAR  Normal to inspection:  Yes  Tympanic membrane:  Intact with sclerosis  External ears normal:  Yes  Internal canal normal:  Normal    Assessment:  ENCOUNTER DIAGNOSES     ICD-10-CM   1. Asymmetric SNHL (sensorineural hearing loss)  H90.3   2. Tinnitus of right ear  H93.11       Plan:  Medical records reviewed on 10/15/2021.  Audiogram from [facility] reviewed with patient.  She has profound SNHL in the right ear and stable mild to moderate SNHL left ear.  Concern for CP tumor.  Will schedule MRI brain.  Start prednisone taper  Repeat audio in 3 weeks and follow up with me after audio    Orders Placed This Encounter   . MRI BRAIN W/WO CONTRAST   . AMB PRN REFERRAL EXTERNAL AUDIOLOGIST   . predniSONE (DELTASONE) 10 mg Oral Tablet     No follow-ups on file.    Emiliano Dyer, FNP-BC  10/14/2021, 08:53

## 2021-10-16 ENCOUNTER — Other Ambulatory Visit (INDEPENDENT_AMBULATORY_CARE_PROVIDER_SITE_OTHER): Payer: Self-pay | Admitting: NURSE PRACTITIONER

## 2021-10-16 MED ORDER — PREDNISONE 10 MG TABLET
ORAL_TABLET | ORAL | 0 refills | Status: DC
Start: 2021-10-16 — End: 2022-07-30

## 2021-10-17 ENCOUNTER — Other Ambulatory Visit: Payer: Self-pay

## 2021-10-21 ENCOUNTER — Other Ambulatory Visit (HOSPITAL_COMMUNITY): Payer: Self-pay | Admitting: NURSE PRACTITIONER

## 2021-10-21 DIAGNOSIS — N3643 Combined hypermobility of urethra and intrinsic sphincter deficiency: Secondary | ICD-10-CM

## 2021-10-27 ENCOUNTER — Other Ambulatory Visit: Payer: Self-pay

## 2021-10-27 ENCOUNTER — Ambulatory Visit
Admission: RE | Admit: 2021-10-27 | Discharge: 2021-10-27 | Disposition: A | Discharge status: Home / Self Care | Payer: Medicaid Other | Source: Ambulatory Visit | Attending: NURSE PRACTITIONER | Admitting: NURSE PRACTITIONER

## 2021-10-27 DIAGNOSIS — H903 Sensorineural hearing loss, bilateral: Secondary | ICD-10-CM | POA: Insufficient documentation

## 2021-10-27 DIAGNOSIS — H9311 Tinnitus, right ear: Secondary | ICD-10-CM | POA: Insufficient documentation

## 2021-10-27 MED ORDER — GADOBUTROL 10 MMOL/10 ML (1 MMOL/ML) INTRAVENOUS SOLUTION
10.0000 mL | INTRAVENOUS | Status: AC
Start: 2021-10-27 — End: 2021-10-27
  Administered 2021-10-27: 10 mL via INTRAVENOUS

## 2021-10-27 NOTE — Result Encounter Note (Signed)
Attempted to reach patient to move up. Voicemail box is not set up

## 2021-10-28 ENCOUNTER — Encounter (INDEPENDENT_AMBULATORY_CARE_PROVIDER_SITE_OTHER): Payer: Self-pay | Admitting: NURSE PRACTITIONER

## 2021-10-28 ENCOUNTER — Ambulatory Visit (INDEPENDENT_AMBULATORY_CARE_PROVIDER_SITE_OTHER): Payer: Medicaid Other | Admitting: NURSE PRACTITIONER

## 2021-10-28 VITALS — Ht 66.0 in | Wt 308.0 lb

## 2021-10-28 DIAGNOSIS — H903 Sensorineural hearing loss, bilateral: Secondary | ICD-10-CM

## 2021-10-28 DIAGNOSIS — D333 Benign neoplasm of cranial nerves: Secondary | ICD-10-CM

## 2021-10-28 DIAGNOSIS — Z6841 Body Mass Index (BMI) 40.0 and over, adult: Secondary | ICD-10-CM

## 2021-10-28 NOTE — Progress Notes (Signed)
ENT, [place]  [place] [address]  Phone: [phone]  Fax: [phone]      Encounter Date: 10/28/2021    Patient ID: Caitlyn Gomez  MRN: [id number]    DOB: 1975-11-08  Age: 46 y.o. female     Progress Note       Referring Provider:  No ref. provider found    Reason for Visit:   Chief Complaint   Patient presents with   . Follow-up After Testing     Rc after mri, pt complains of otalgia and tinnitus        History of Present Illness:  Caitlyn Gomez is a 46 y.o. female follow up SNHL right ear.  She will complete prednisone taper tomorrow.  MRI Brain completed today.  She reports no changes in hearing and is now having increased vertigo episodes.      Patient History:  Patient Active Problem List   Diagnosis   . S/P endometrial ablation   . Chiari malformation type I (CMS [place])   . Acute exacerbation of chronic obstructive pulmonary disease (COPD) (CMS HCC)   . GERD (gastroesophageal reflux disease)   . HTN (hypertension)   . Depression   . Chronic obstructive airway disease (CMS HCC)   . Smoking addiction     Current Outpatient Medications   Medication Sig   . apixaban (ELIQUIS) 5 mg Oral Tablet Take 1 Tablet (5 mg total) by mouth Twice daily   . atorvastatin (LIPITOR) 40 mg Oral Tablet Take 1 Tablet (40 mg total) by mouth Every evening   . bethanechol chloride (URECHOLINE) 5 mg Oral Tablet Take 1 Tablet (5 mg total) by mouth Every night   . citalopram (CELEXA) 20 mg Oral Tablet Take 1 Tablet (20 mg total) by mouth Once a day (Patient not taking: Reported on 10/28/2021)   . fluticasone propion-salmeteroL (ADVAIR) 230-21 mcg/actuation Inhalation oral inhaler Take 2 Puffs by inhalation Twice daily   . metoprolol succinate (TOPROL-XL) 50 mg Oral Tablet Sustained Release 24 hr Take 1 Tablet (50 mg total) by mouth Once a day   . nitroGLYCERIN (NITROSTAT) 0.3 mg Sublingual Tablet, Sublingual Place 1 Tablet (0.3 mg total) under the tongue Every 5 minutes as needed for Chest pain for 3  doses over 15 minutes   . omeprazole (PRILOSEC) 40 mg Oral Capsule, Delayed Release(E.C.) Take 1 Capsule (40 mg total) by mouth Twice daily (Patient taking differently: Take 1 Capsule (40 mg total) by mouth Once a day)   . pantoprazole (PROTONIX) 40 mg Oral Tablet, Delayed Release (E.C.) Take 1 Tablet (40 mg total) by mouth Twice daily Take on empty stomach, eat 30-60 mins later   . predniSONE (DELTASONE) 10 mg Oral Tablet Take 5 Tablets (50 mg) daily for 4 days, then 4 Tablets (40 mg) daily for 4 days, then 3 Tablets (30 mg) daily for 4 days, then 2 Tablets (20 mg) daily for 4 days, then 1 Tablet (10 mg) daily for 4 days, Then 1/2 Tablet ('5mg'$ ) daily for 4 days   . PROAIR HFA 90 mcg/actuation Inhalation oral inhaler Take 1 Puff by inhalation Every 4 hours as needed      Allergies   Allergen Reactions   . Hymenoptera Allergenic Extract      Other reaction(s): UNKNOWN   . Penicillins Hives/ Urticaria and Rash     Other reaction(s): Rash, Skin Irritation  Rash and swellling     . Amoxicillin    . Morphine  Other reaction(s): ARM SWELLED UPON INJECTION, Rash   . Sulfa (Sulfonamides)      Past Medical History:   Diagnosis Date   . A-fib (CMS HCC)     Dr. Chauncey Cruel. Lucio Edward   . Abnormal Pap smear    . Anxiety    . Arthropathy    . Asthma    . Chronic obstructive airway disease (CMS HCC)    . Coronary artery disease    . Depression    . Dysplasia of cervix    . Dysrhythmias     a fib   . Ectopic pregnancy    . Fibromyalgia    . GERD (gastroesophageal reflux disease)     controlled with med   . History of anesthesia complications     aspirated for scope last time   . History of cervical cancer    . HTN (hypertension)    . Hyperlipidemia    . NAFLD (nonalcoholic fatty liver disease)    . Neck mass    . PCOS (polycystic ovarian syndrome)    . Smoking addiction    . Stroke (CMS [facility])     TIA- [place] - 2016 - (left facial droop and left arm weakness)   . Thyroid disorder     nodule   . Thyroid nodule    . Varicosities      Past Surgical  History:   Procedure Laterality Date   . HX APPENDECTOMY     . HX DILATION AND CURETTAGE     . HX ENDOMETRIAL ABLATION     . HX HEART CATHETERIZATION      stent   . HX HYSTERECTOMY     . HX TONSILLECTOMY     . HX TUBAL LIGATION       Family Medical History:     Problem Relation (Age of Onset)    Colon Cancer Maternal Grandfather    Colon Polyps Maternal Grandmother    Diabetes Maternal Grandmother, Paternal 1    Healthy Mother    Heart Disease Father    Hypertension (High Blood Pressure) Father    Inflammatory Bowel Dz Maternal cousin    Stomach Cancer Maternal Aunt    Sudden Death no cause Other          Social History     Tobacco Use   . Smoking status: Every Day     Packs/day: 1.00     Types: Cigarettes   . Smokeless tobacco: Never   Vaping Use   . Vaping Use: Never used   Substance Use Topics   . Alcohol use: Not Currently     Alcohol/week: 0.8 standard drinks     Types: 1 Glasses of wine per week     Comment: 2-3 times a month;   Marland Kitchen Drug use: Never       Review of Systems   Constitutional: Negative.    HENT: Positive for ear pain, hearing loss and tinnitus.    Allergic/Immunologic: Negative.    Neurological: Positive for dizziness and headaches.   Psychiatric/Behavioral: Negative.         Physical Exam:  Ht 1.676 m ('5\' 6"'$ )   Wt (!) 140 kg (308 lb)   LMP 09/07/2009   BMI 49.71 kg/m       GENERAL APPEARANCE  Morbidly obese  MOOD AND AFFECT  Normal  COMMUNICATION  Normal  NEURO EXAM  Cranial II-XII intact, grossly intact and alert and oriented x3  HEAD & NECK  Normal parotid gland:  Yes  Normal submandibular gland:  Yes  Neck mass:  No  Neck tenderness:  No  Lymphadenopathy:  No  Thyroid normal:  Yes  NOSE  Normal to inspection  ORAL CAVITY  Normal gums, lips, teeth:  Yes  Tonsils: 1+ bilateral  Oropharynx normal:  Yes  RIGHT EAR  Normal to inspection:  Yes  Tympanic membrane:  Intact  External ears normal:  Yes  Internal canal normal:  Normal  LEFT EAR  Normal to inspection:  Yes  Tympanic membrane:   Intact  External ears normal:  Yes  Internal canal normal:  Normal    Assessment:  ENCOUNTER DIAGNOSES     ICD-10-CM   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 Asymmetric SNHL (sensorineural hearing loss)  H90.3       Plan:  Medical records reviewed on 10/28/2021.  Reviewed MRI brain report with patient.  This shows a new 1.6cm x 0.5cm x 0.7cm enhancing mass right IAC suggestive for vestibular schwannoma and Chiari malformation unchanged.    I recommend referral to neurosurgery for IAC mass.  She reports that she is an established patient with Dr. Caryl Comes [facility] and would like to see him for this.   Will cancel repeat audiogram scheduled in 2 weeks.  No orders of the defined types were placed in this encounter.    No follow-ups on file.    Emiliano Dyer, FNP-BC  10/28/2021, 15:54

## 2021-10-30 ENCOUNTER — Ambulatory Visit (INDEPENDENT_AMBULATORY_CARE_PROVIDER_SITE_OTHER): Payer: Self-pay | Admitting: Neurological Surgery

## 2021-10-30 IMAGING — MR MRI SHOULDER LT W/O CONTRAST
6 series · 39 of 40 positions shown · IV contrast (gadolinium)
Comparison: MRI dated 06/05/2021.

﻿EXAM:  [id number]   MRI SHOULDER LT W/O CONTRAST
INDICATION: Persistent pain, recent surgery.
TECHNIQUE: Multiplanar multisequential MRI of the left shoulder joint was performed without gadolinium contrast.

[Series 6: T1 · coronal · 4.0mm · 0.40mm/px · 7 of 18 slices shown]
[im 1/18]
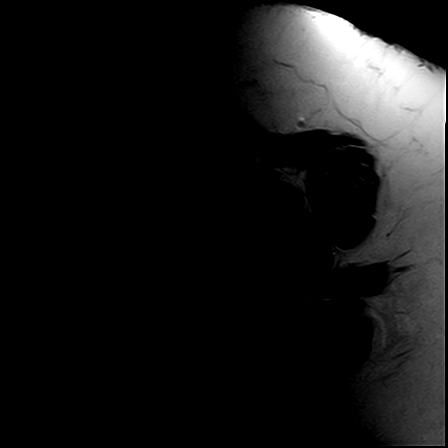
[im 3/18]
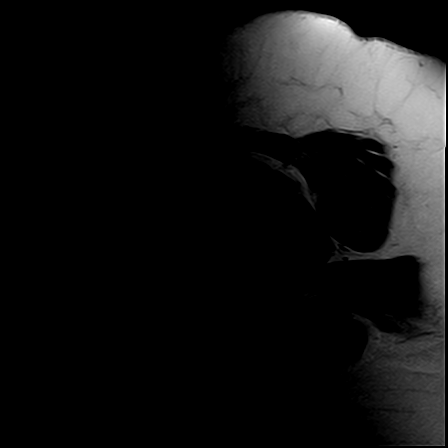
[im 6/18]
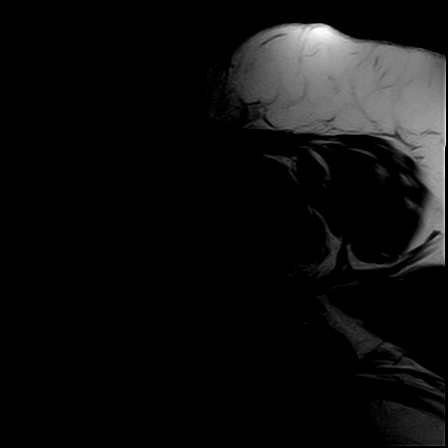
[im 9/18]
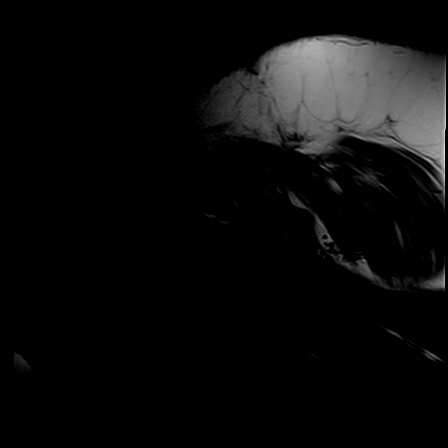
[im 12/18]
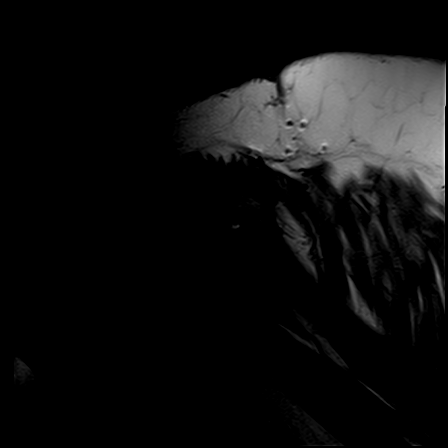
[im 15/18]
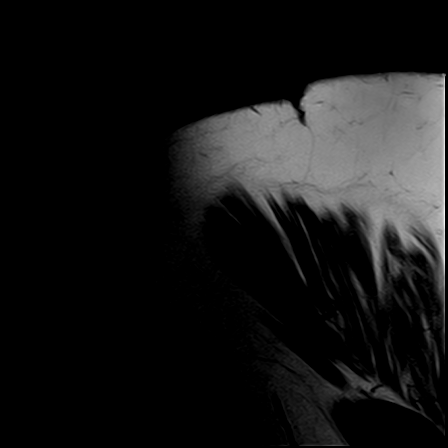
[im 18/18]
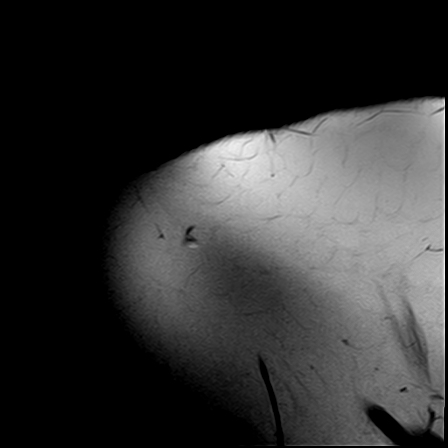

[Series 7: STIR · coronal · 4.0mm · 0.56mm/px · 7 of 18 slices shown (1 of 2)]
[im 1/18]
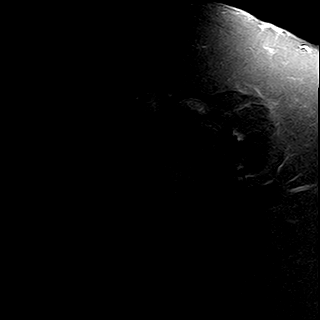
[im 3/18]
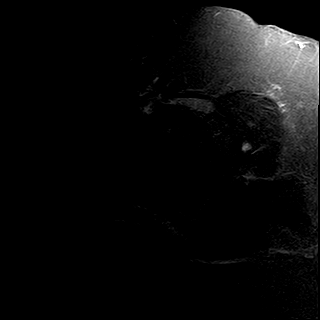
[im 6/18]
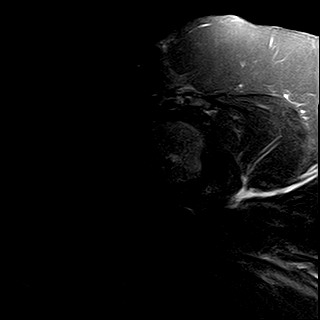
[im 9/18]
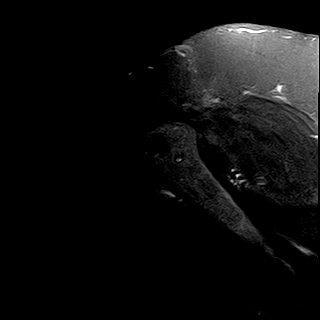
[im 12/18]
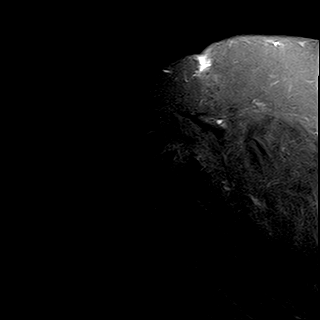
[im 15/18]
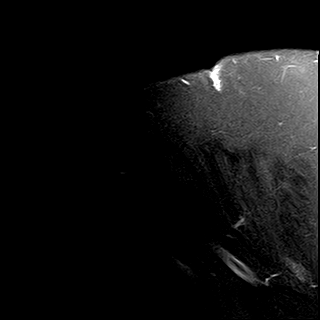
[im 18/18]
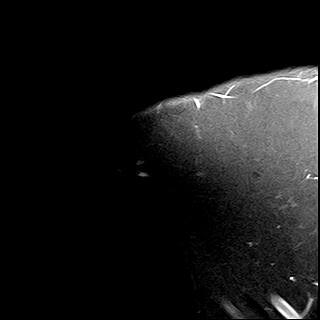

[Series 8: PD fat-sat · axial · 4.5mm · 0.56mm/px · z∈[-51,+33]mm · 6 of 18 slices shown (1 of 2)]
[im 1/18]
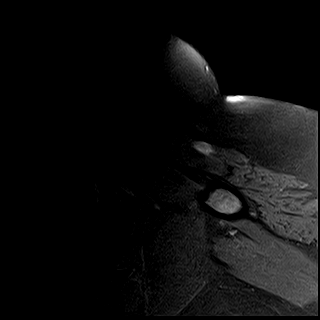
[im 4/18]
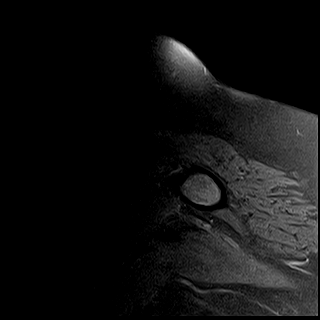
[im 7/18]
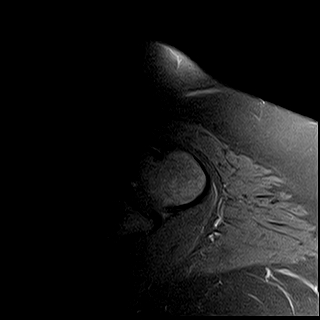
[im 11/18]
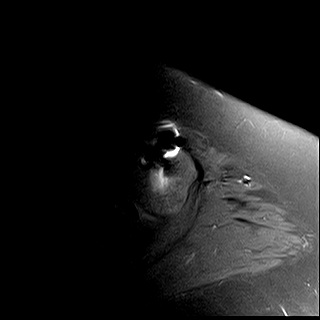
[im 14/18]
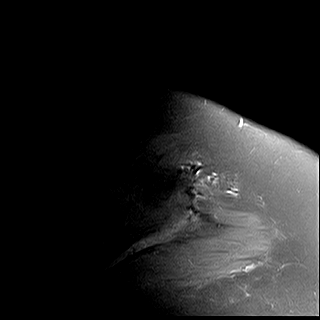
[im 18/18]
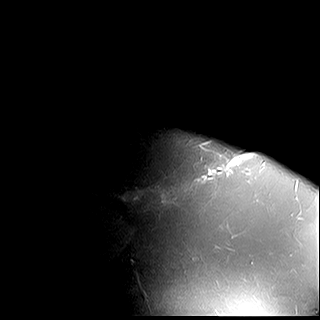

[Series 9: T2 fat-sat · axial · 4.0mm · 0.42mm/px · z∈[-58,+44]mm · 8 of 24 slices shown]
[im 1/24]
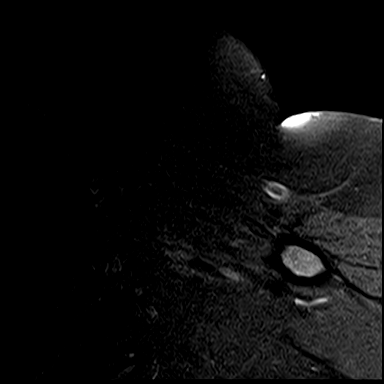
[im 4/24]
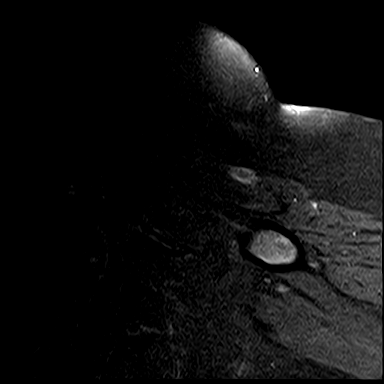
[im 7/24]
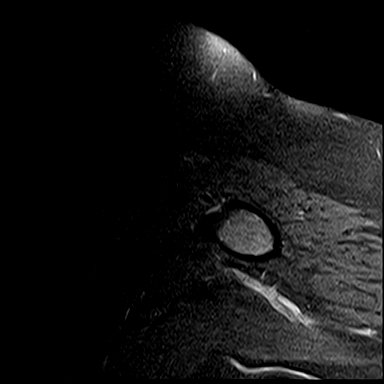
[im 10/24]
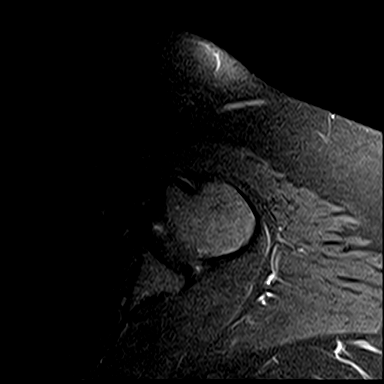
[im 14/24]
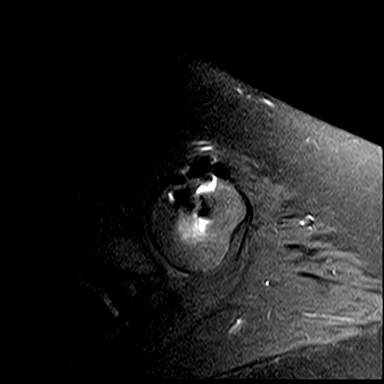
[im 17/24]
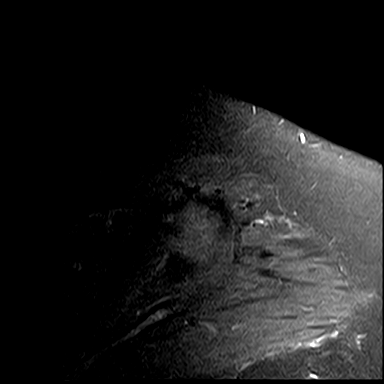
[im 20/24]
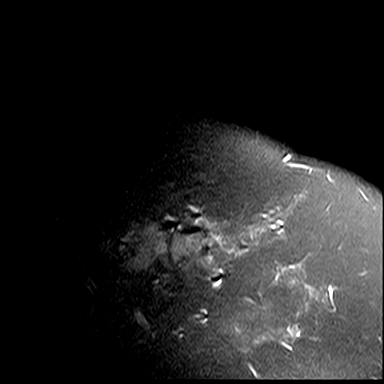
[im 24/24]
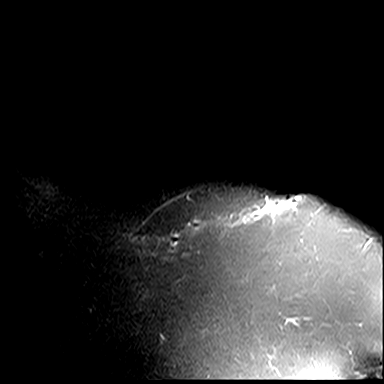

[Series 10: PD fat-sat · oblique · 4.0mm · 0.56mm/px · 6 of 18 slices shown (2 of 2)]
[im 1/18]
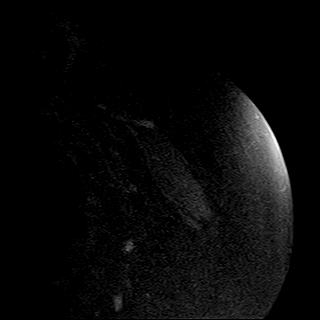
[im 4/18]
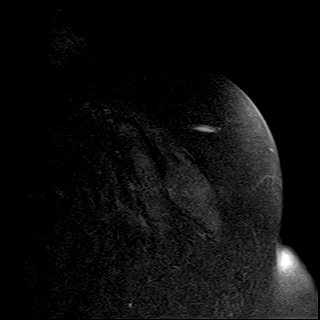
[im 7/18]
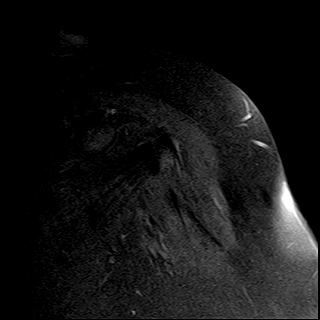
[im 11/18]
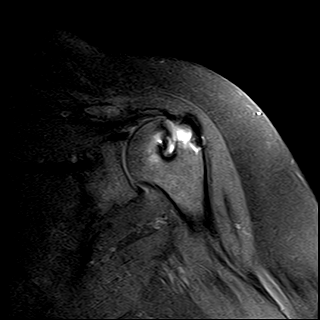
[im 14/18]
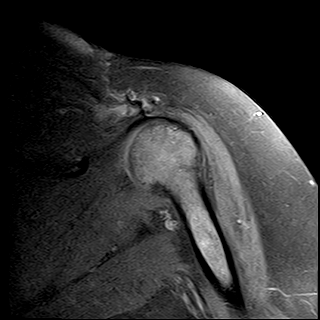
[im 18/18]
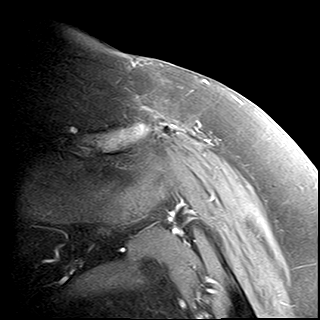

[Series 11: STIR · oblique · 4.0mm · 0.56mm/px · 5 of 18 slices shown (2 of 2)]
[im 1/18]
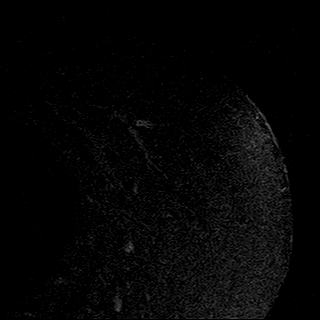
[im 4/18]
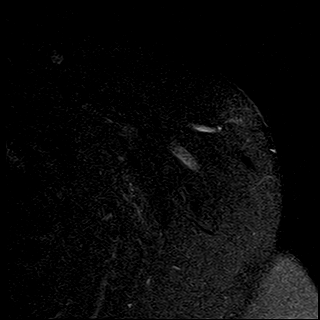
[im 7/18]
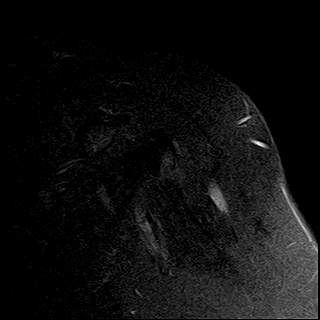
[im 11/18]
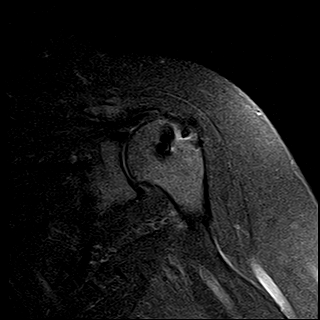
[im 14/18]
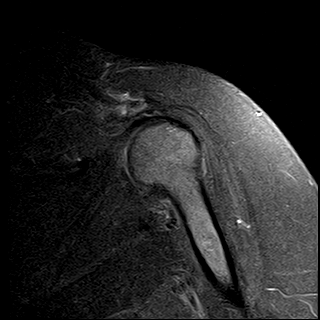

[39 of 40 positions shown; findings below may reference images not displayed]

FINDINGS: Postsurgical changes of rotator cuff repair are noted.  The rotator cuff grossly appears intact.  Surgical material is also noted along the acromioclavicular joint.  Long head of biceps tendon is well seated within the intertubercular groove and attaches normally to the biceps anchor. Superior labrum is intact. Glenohumeral articular cartilage is well maintained.  There is no significant fluid within the subacromial/subdeltoid bursa.  Muscle bulk and bone marrow signal intensity are normal.  No mass is seen along the course of the suprascapular nerve, within the spinoglenoid notch or within the quadrilateral space.
IMPRESSION: Grossly intact rotator cuff.

## 2021-10-30 NOTE — Telephone Encounter (Signed)
I spoke w/Allessandra and advised of appt, date, time, and location.  Kadijah verbalized understanding.    Thank you + Kind Gus Rankin, CMA-Neurosurgery [phone]

## 2021-10-31 ENCOUNTER — Ambulatory Visit: Payer: Medicaid Other | Attending: NURSE PRACTITIONER | Admitting: Neurological Surgery

## 2021-10-31 ENCOUNTER — Other Ambulatory Visit: Payer: Self-pay

## 2021-10-31 ENCOUNTER — Encounter (INDEPENDENT_AMBULATORY_CARE_PROVIDER_SITE_OTHER): Payer: Self-pay | Admitting: Neurological Surgery

## 2021-10-31 DIAGNOSIS — Z6841 Body Mass Index (BMI) 40.0 and over, adult: Secondary | ICD-10-CM

## 2021-10-31 DIAGNOSIS — D333 Benign neoplasm of cranial nerves: Secondary | ICD-10-CM | POA: Insufficient documentation

## 2021-10-31 DIAGNOSIS — H903 Sensorineural hearing loss, bilateral: Secondary | ICD-10-CM | POA: Insufficient documentation

## 2021-10-31 NOTE — Progress Notes (Signed)
[facility]  Return Outpatient Note    Date:  10/31/2021  Age:  46 y.o.  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 She was last seen on 03/10/21 (was to RTC in 6 months for clinical f/u). She was seen by a local ENT in [place] for SNHL on the right. They ordered a new MRI that showed a right vestibular schwannoma. Here for further review.    The patient noted gradual right hearing loss starting 3 months ago with associated tinnitus. She also reports twitching in the OD and h/a with balance issues.     PMH significant for Afib, CAD, HTN, CVA, DM, thyroid nodules, cervical cancer, fibromyalgia, and OU macular degeneration. Recently underwent repair of left rotator cuff tear (has pain with limited ROM). Currently smokes 1/2 PPD x 20 yrs.      Objective:   Vital Signs:  BP (!) 137/115   Pulse (!) 101   Temp 36.3 C (97.3 F) (Thermal Scan)   Ht 1.676 m ('5\' 6"'$ )   Wt (!) 159 kg (350 lb 5 oz)   LMP 09/07/2009   BMI 56.54 kg/m       Constitutional  General appearance: Normal  Eyes: Ophthalmic exam of optic discs and posterior segments: difficulty visualizing fundi   Cardiovascular:   Carotid arteries: Normal  Musculoskeletal  Gait and Station: : Normal  Muscle strength (upper extremities): : limited ROM on left since rotator cuff surgery   Muscle strength (lower extremities): : Normal  Sensation: Normal  Coordination: Normal  Neurological  Orientation: Normal  Recent and remote memory: Normal  Attention span and concentration: Normal  Language: Normal  Fund of knowledge: Normal  Cranial Nerves  2nd: Normal  3rd,4th,6th: Normal  5th: Normal  7th: Normal  8th: Abnormal: decreased hearing on right  9th: Normal  11th: Normal  12th: Normal      Data reviewed  10/27/21 MRI brain w/wo contrast (films/report reviewed): right  vestibular schwannoma (Dr. Bobby Rumpf states that it has not changed in size when compared to the 01/30/20 imaging).     Assessment:      ICD-10-CM    1. Vestibular schwannoma (CMS HCC)  D33.3 Refer to [facility] ENT [place]     MRI IAC W/WO CONTRAST      2. Asymmetric SNHL (sensorineural hearing loss)  H90.3 Refer to [facility] [place]     MRI [facility] W/WO CONTRAST          Recommendations  -The natural history, film findings, and indications for treatment were discussed.  -Will refer to [facility] ENT for an opinion (consider hearing aid?).  -RTC with Dr. Bobby Rumpf in 2 years with MRI IAC w/wo contrast, sooner if problems develop.     -A copy of the note from today's clinic appointment will be sent via fax or mail to the patient's PCP and/or referring physician on file.       Sharman Crate, PA-C 10/31/2021, 14:36    The patient was seen as a shared visit with the co-signing faculty.

## 2021-11-03 ENCOUNTER — Ambulatory Visit (INDEPENDENT_AMBULATORY_CARE_PROVIDER_SITE_OTHER): Payer: Self-pay | Admitting: Neurological Surgery

## 2021-11-05 ENCOUNTER — Encounter (INDEPENDENT_AMBULATORY_CARE_PROVIDER_SITE_OTHER): Payer: Self-pay | Admitting: NURSE PRACTITIONER

## 2021-11-10 ENCOUNTER — Encounter (INDEPENDENT_AMBULATORY_CARE_PROVIDER_SITE_OTHER): Payer: Self-pay | Admitting: NURSE PRACTITIONER

## 2021-11-13 ENCOUNTER — Ambulatory Visit (HOSPITAL_COMMUNITY): Payer: Self-pay

## 2021-11-20 ENCOUNTER — Encounter (INDEPENDENT_AMBULATORY_CARE_PROVIDER_SITE_OTHER): Payer: Self-pay | Admitting: NURSE PRACTITIONER

## 2021-12-02 ENCOUNTER — Ambulatory Visit (INDEPENDENT_AMBULATORY_CARE_PROVIDER_SITE_OTHER): Payer: Medicaid Other | Admitting: NURSE PRACTITIONER, FAMILY

## 2021-12-02 ENCOUNTER — Encounter (INDEPENDENT_AMBULATORY_CARE_PROVIDER_SITE_OTHER): Payer: Self-pay | Admitting: NURSE PRACTITIONER, FAMILY

## 2021-12-02 ENCOUNTER — Other Ambulatory Visit: Payer: Self-pay

## 2021-12-02 DIAGNOSIS — Z6841 Body Mass Index (BMI) 40.0 and over, adult: Secondary | ICD-10-CM

## 2021-12-02 DIAGNOSIS — H903 Sensorineural hearing loss, bilateral: Secondary | ICD-10-CM

## 2021-12-02 DIAGNOSIS — D333 Benign neoplasm of cranial nerves: Secondary | ICD-10-CM

## 2021-12-02 NOTE — Progress Notes (Signed)
PATIENT NAME:  Caitlyn Gomez  MRN:  [id number]  DOB:  09-21-1975  DATE OF SERVICE: 12/02/2021    Chief Complaint:  Vestibular Schwannoma (Right side), Hearing Loss (Left Ear), and Ringing in Ear (Left Ear)      HPI:  Bradlee Bridgers is a 46 y.o. female seen in the clinic today for referral by Sharman Crate, PA-C, with neurosurgery at the [facility].  History of asymmetrical hearing loss and right vestibular schwannoma.  Patient comes today with her last audiogram on October 15, 2021, performed at the [facility] in [place], [place].    History of hearing loss in her left ear, which is slightly worsened.  Patient notes tinnitus in her left ear.  Feels like hearing is stable in her left ear.  History of a right hearing aid and significant right hearing loss.  Previous audiogram February 13, 2020, with sensorineural hearing loss.  The most recent audiogram on September 25, 2021, shows a mild to moderate sensorineural hearing loss in the left ear and a profound hearing loss in the right ear.  There is a family history of hearing loss with the patient's father.  History of chronic otitis media.    About 2-1/2 to 3 months ago, the patient was standing in her kitchen when she felt a jolt of pain along her face and right ear and then heard a pop sound.  The patient said it occurred over the weekend.  She developed a constant ringing making it difficulty to hear out of her right ear.  States that ringing has been consistent and persistent.  At that time, she became lightheaded and dizzy.  She states that ENT was called.  On October 16, 2021, she was started on high dosed oral steroids/prednisone.  She states that there was no improvement and then an MRI was ordered through [facility].  The patient states that she sees Otolaryngology in [place], [place].  She has a history of a Chiari I malformation.  On October 31, 2021, she was seen by Dr. Bobby Rumpf,  neurosurgeon, as well as Sharman Crate, PA-C with neurosurgery.  An MRI had been ordered by the otolaryngologist in [place] showing a vestibular schwannoma of the right ear.  Dr. Bobby Rumpf spoke to the patient about options of surgery and treatment.  He ordered her to return in 2 years with a previsit MRI IAC with and without contrast.  He also referred her to ENT for possible hearing aid.  The patient denies use of hearing aids now.  In 2002, she was given a hearing aid for her right ear.  This later broke and she is no longer using a hearing aid for the right ear.  She does have a history of chronic otitis media and placement of bilateral ventilation tubes.    By the end of January 2023, early in February 2023, she was noticing more of a gradual right-sided hearing loss with associated tinnitus.  She also noticed a twitching of her right ear as well as balance issues.    She had been seen in the past by Dr. Aretta Nip at the [facility] with head and neck, ENT.  She is currently being evaluated and managed for thyroid nodules as well as the patient states she is having this frequent gasping noise that she makes from her throat area at least 15 times per day.  States that she has been told this  could possibly be a laryngospasm and she will go for further followup.        Past Medical History:  Past Medical History:   Diagnosis Date   . A-fib (CMS HCC)     Dr. Chauncey Cruel. Lucio Edward   . Abnormal Pap smear    . Anxiety    . Arthropathy    . Asthma    . Chronic obstructive airway disease (CMS HCC)    . Coronary artery disease    . Depression    . Dysplasia of cervix    . Dysrhythmias     a fib   . Ectopic pregnancy    . Fibromyalgia    . GERD (gastroesophageal reflux disease)     controlled with med   . History of anesthesia complications     aspirated for scope last time   . History of cervical cancer    . HTN (hypertension)    . Hyperlipidemia    . NAFLD (nonalcoholic fatty liver disease)    . Neck mass    .  PCOS (polycystic ovarian syndrome)    . Smoking addiction    . Stroke (CMS [facility])     TIA- [place] - 2016 - (left facial droop and left arm weakness)   . Thyroid disorder     nodule   . Thyroid nodule    . Varicosities            Past Surgical History:  Past Surgical History:   Procedure Laterality Date   . HX APPENDECTOMY     . HX DILATION AND CURETTAGE     . HX ENDOMETRIAL ABLATION     . HX HEART CATHETERIZATION      stent   . HX HYSTERECTOMY     . HX TONSILLECTOMY     . HX TUBAL LIGATION             Family History:  Family Medical History:       Problem Relation (Age of Onset)    Colon Cancer Maternal Grandfather    Colon Polyps Maternal Grandmother    Diabetes Maternal Grandmother, Paternal 16    Healthy Mother    Heart Disease Father    Hypertension (High Blood Pressure) Father    Inflammatory Bowel Dz Maternal cousin    Stomach Cancer Maternal Aunt    Sudden Death no cause Other              Social History:  Social History     Tobacco Use   Smoking Status Every Day   . Packs/day: 1.00   . Types: Cigarettes   Smokeless Tobacco Never     Social History     Substance and Sexual Activity   Alcohol Use Not Currently   . Alcohol/week: 0.8 standard drinks   . Types: 1 Glasses of wine per week    Comment: 2-3 times a month;     Social History     Occupational History   . Occupation: Engineer, mining       Medications:  Outpatient Medications Marked as Taking for the 12/02/21 encounter (Office Visit) with Kern Reap, APRN,FNP-BC   Medication Sig   . apixaban (ELIQUIS) 5 mg Oral Tablet Take 1 Tablet (5 mg total) by mouth Twice daily   . atorvastatin (LIPITOR) 40 mg Oral Tablet Take 1 Tablet (40 mg total) by mouth Every evening   . bethanechol chloride (URECHOLINE) 5 mg Oral Tablet Take 1 Tablet (5 mg total) by  mouth Every night   . citalopram (CELEXA) 20 mg Oral Tablet Take 1 Tablet (20 mg total) by mouth Once a day   . fluticasone propion-salmeteroL (ADVAIR) 230-21 mcg/actuation Inhalation oral inhaler Take 2  Puffs by inhalation Twice daily   . metoprolol succinate (TOPROL-XL) 50 mg Oral Tablet Sustained Release 24 hr Take 1 Tablet (50 mg total) by mouth Once a day   . omeprazole (PRILOSEC) 40 mg Oral Capsule, Delayed Release(E.C.) Take 1 Capsule (40 mg total) by mouth Twice daily (Patient taking differently: Take 1 Capsule (40 mg total) by mouth Once a day)   . pantoprazole (PROTONIX) 40 mg Oral Tablet, Delayed Release (E.C.) Take 1 Tablet (40 mg total) by mouth Twice daily Take on empty stomach, eat 30-60 mins later   . PROAIR HFA 90 mcg/actuation Inhalation oral inhaler Take 1 Puff by inhalation Every 4 hours as needed       Allergies:  Allergies   Allergen Reactions   . Hymenoptera Allergenic Extract      Other reaction(s): UNKNOWN   . Penicillins Hives/ Urticaria and Rash     Other reaction(s): Rash, Skin Irritation  Rash and swellling     . Amoxicillin    . Morphine      Other reaction(s): ARM SWELLED UPON INJECTION, Rash   . Sulfa (Sulfonamides)        Review of Systems:                                                        All other systems reviewed and found to be negative.    Physical Exam:  Temperature 36.6 C (97.9 F), temperature source Temporal, height 1.676 m ('5\' 6"'$ ), weight (!) 154 kg (340 lb 9.8 oz), last menstrual period 09/07/2009.  Body mass index is 54.98 kg/m.  General Appearance: Pleasant, cooperative, obese, and in no acute distress.  Eyes: Conjunctivae/corneas clear, PERRLA, EOM's intact.  Head and Face: Normocephalic, atraumatic.  Face symmetric, no obvious lesions.   Pinnae: Normal shape and position.   External auditory canals:  Patent without inflammation AU.    Tympanic membranes:  Exam using binocular microscope:  Right:  Anterior inferior thickening rim, otherwise translucent.  Poor mobility  Left:  Superior retraction pocket, clear with not inflammation or cholesteatoma, + tympanosclerosis and poor mobility.  Nose:  External pyramid midline. Septum midline. Mucosa normal. No purulence,  polyps, or crusts.   Oral Cavity/Oropharynx: No mucosal lesions, masses, or pharyngeal asymmetry.  Hypopharynx/Larynx:  voice raspy with gasping or noise / laryngospasm.  Neck:  No palpable salivary gland, or neck masses.  Supple.  History thyroid nodules.  Heme/Lymph:  No cervical adenopathy.  Cardiovascular:  Good perfusion of upper extremities.  No cyanosis of the hands or fingers.  Lungs: No apparent stridorous breathing. No acute distress.  Skin: Skin warm and dry.  Neurologic: Cranial nerves:  grossly intact.  Psychiatric:  Alert and oriented x 3.      Procedure:  N/A      Data Reviewed:   1. Audiogram #1 reviewed from [facility] dated February 13, 2020.  This is scanned to media.  The patient had normal physiological ear canal volumes bilaterally.  Type AS tympanograms bilaterally.  SRT 30 decibels left ear, 55 decibels right ear.  Discrimination 96% at 60  decibels left ear and 88% at 85 decibels right ear.  The patient was having right moderate-to-severe mixed hearing loss and left mild sensorineural hearing loss.    Audiogram #2 reviewed from [facility] dated October 15, 2021.  The patient has normal physiological ear canal volumes bilaterally.  The right ear is retracted and the left ear is hypermobile according to the tympanogram.  SRT 35 decibels left ear and unobtainable right ear.  Discrimination 92% at 75 decibels left ear and unobtainable at right ear.  Diagnosis is right profound sensorineural hearing loss and left mild-to-moderate sensorineural hearing loss.  Scanned to media.        Assessment:  Assessment/Plan   1. Vestibular schwannoma (CMS HCC)    2. Asymmetric SNHL (sensorineural hearing loss)        Plan:  1.  Review of Audiogram 2021 and 2023.  See Data Review above.  2.  Hearing Aid Medical Clearance provided for LEFT EAR ONLY / CROS AID.  Patient can schedule hearing aid evaluation as desired.   3.  Copy of medical clearance mailed to patient  after review HPI, Audiogram and MRI with Dr. Ferrel Logan.  4.  Plan follow-up with Neurosurgery with MRI for surveillance in 2 years and plan to see Dr. Roslyn Smiling after visit with Neurosurgery.    5.  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    6.  Keep follow-up at [facility] for thyroid nodule.    7.  Patient verbalized understanding of instructions and plan of care and follow-up as discussed.    Kern Reap, APRN,FNP-BC 12/02/2021, 15:16    PCP:  [facility]  [address]  [place] [address]   REF:  Sharman Crate, PA-C  [address]  [address]  [place] [id number]       Portions of this note may be dictated using voice recognition software. Variances in spelling and vocabulary are possible and unintentional. Not all errors are caught/corrected. Please notify the [facility] if any discrepancies are noted or if the meaning of any statement is not clear.

## 2021-12-04 ENCOUNTER — Ambulatory Visit (HOSPITAL_COMMUNITY): Payer: Self-pay

## 2021-12-17 ENCOUNTER — Ambulatory Visit (INDEPENDENT_AMBULATORY_CARE_PROVIDER_SITE_OTHER): Payer: Self-pay | Admitting: NURSE PRACTITIONER, FAMILY

## 2021-12-17 NOTE — Telephone Encounter (Signed)
Regarding: Ritchie  ----- Message from Narcissa sent at 12/17/2021  3:38 PM EDT -----  Pt called and said she was ot get a call after Ritchie had talked to the surgeon . She is not sure what is going on and would like to get an update. Please call back to advise, Thank you!!!

## 2021-12-18 NOTE — Telephone Encounter (Signed)
Ritchie, Erik Obey, APRN,FNP-BC  Mendel Ryder, RN  Caller: Unspecified (Yesterday, 3:37 PM)  I called patient and will send medical clearance for Cross hearing aid. Will mail to patient. She will follow-up with Dr. Roslyn Smiling in 2 years after MRI and follow-up with Neurosurgery. Patient aware.      Kern Reap, APRN,FNP-BC

## 2021-12-29 ENCOUNTER — Encounter (INDEPENDENT_AMBULATORY_CARE_PROVIDER_SITE_OTHER): Payer: Medicaid Other | Admitting: NURSE PRACTITIONER

## 2022-02-02 IMAGING — MR MRI CERVICAL SPINE WITHOUT CONTRAST
4 of 5 series · 23 of 48 positions shown · IV contrast (gadolinium)
Comparison: None available.
COMPARISON: None available.

------------- REPORT [id number] -------------
﻿EXAM:  [id number]   MRI CERVICAL SPINE WITHOUT CONTRAST
INDICATION: 45-year-old with history of neck pain and radicular symptoms on the right side with weakness and limited range of motion..
TECHNIQUE: Multiplanar multisequential MRI of the C-spine was performed without gadolinium contrast.
INDICATION: 45-year-old with history of neck pain and radicular symptoms on the right side with weakness and limited range of motion.
TECHNIQUE: Multiplanar, multisequential MRI of the cervical spine was performed without gadolinium contrast.

[Series 6: T1 · sagittal · 3.0mm · 0.47mm/px · 3 of 13 slices shown]
[im 2/13]
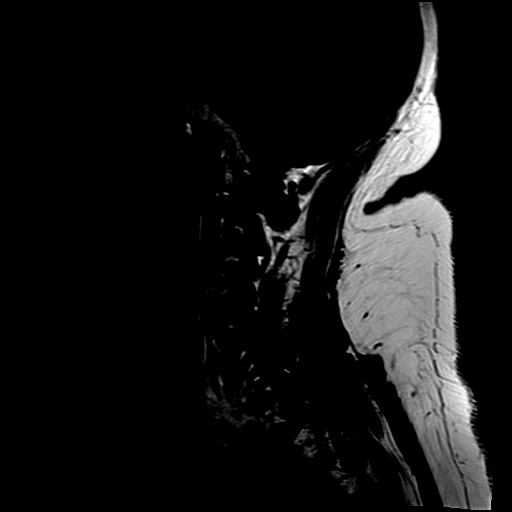
[im 7/13]
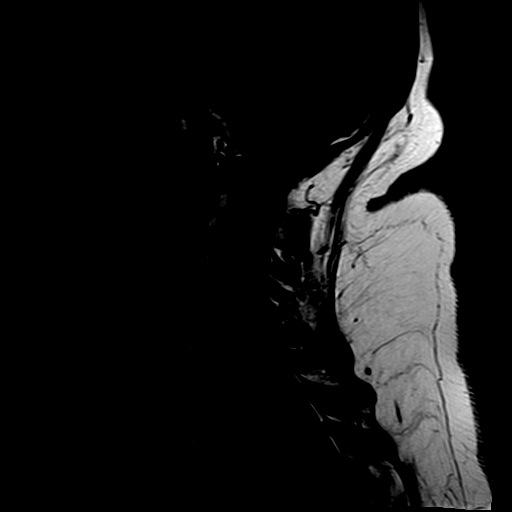
[im 11/13]
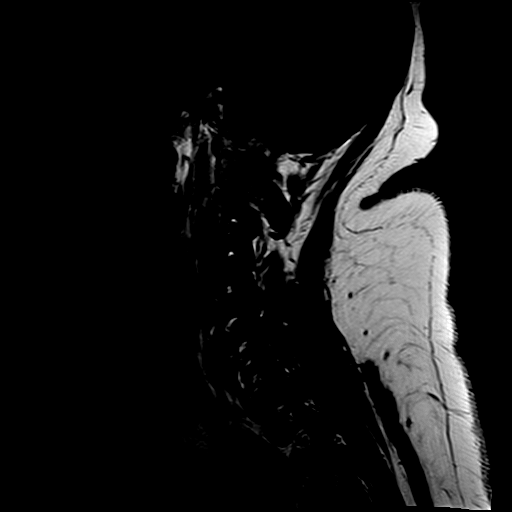

[Series 7: STIR · sagittal · 3.0mm · 0.47mm/px · 3 of 13 slices shown]
[im 2/13]
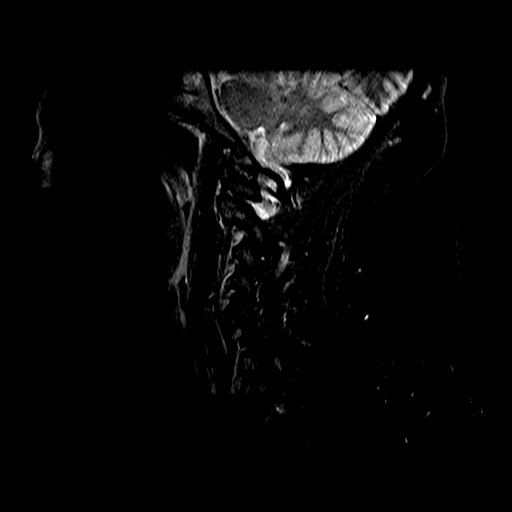
[im 7/13]
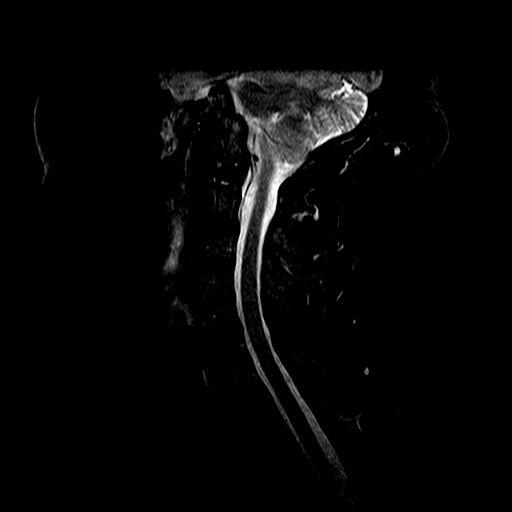
[im 11/13]
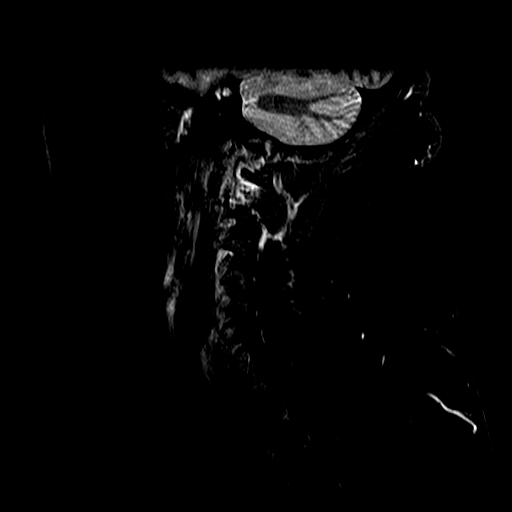

[Series 8: T2 · sagittal · 3.0mm · 0.75mm/px · 8 of 13 slices shown (1 of 2)]
[im 1/13]
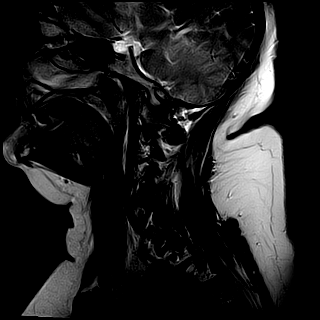
[im 2/13]
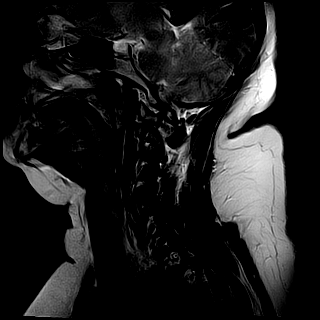
[im 4/13]
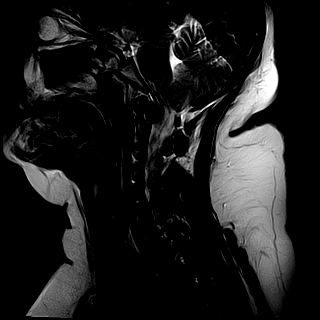
[im 6/13]
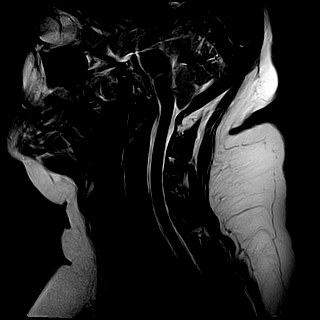
[im 7/13]
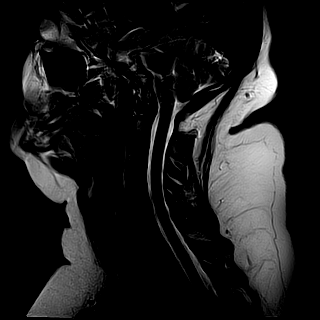
[im 9/13]
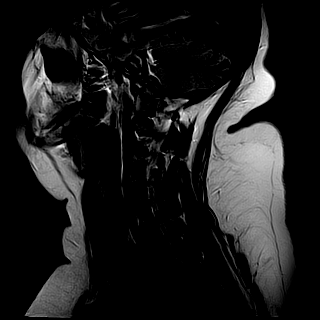
[im 11/13]
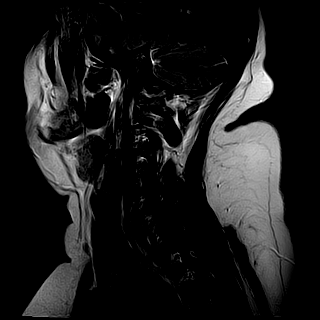
[im 13/13]
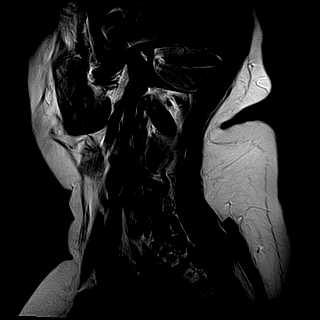

[Series 10: T2 · axial · 3.0mm · 0.39mm/px · z∈[-121,-18]mm · 9 of 18 slices shown (2 of 2)]
[im 1/18]
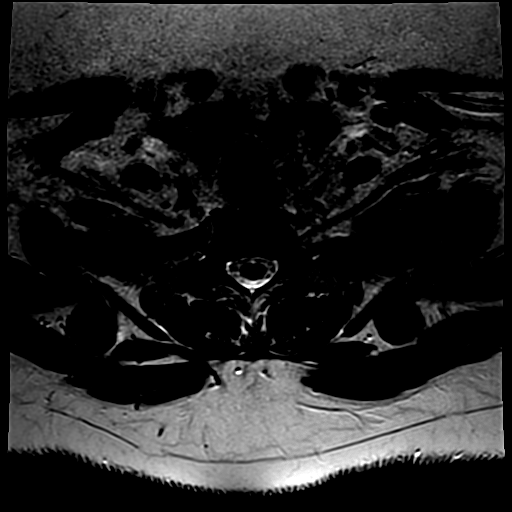
[im 4/18]
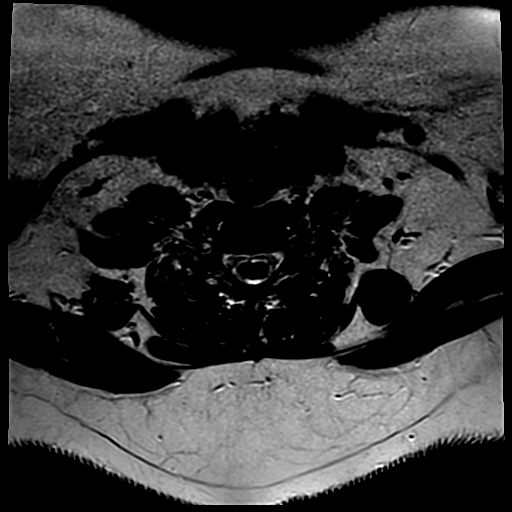
[im 5/18]
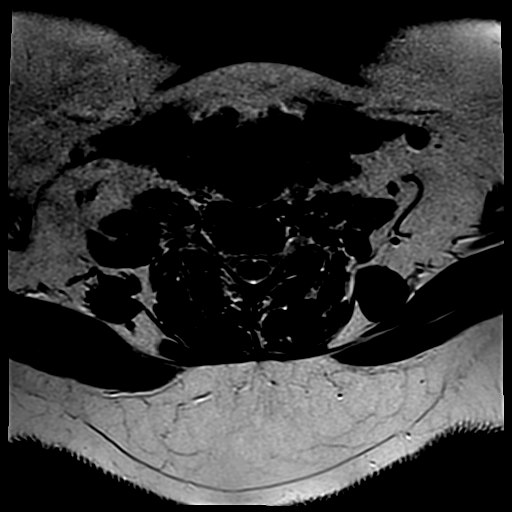
[im 8/18]
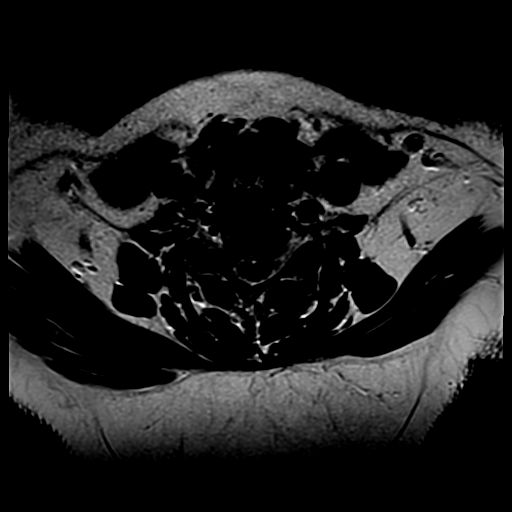
[im 10/18]
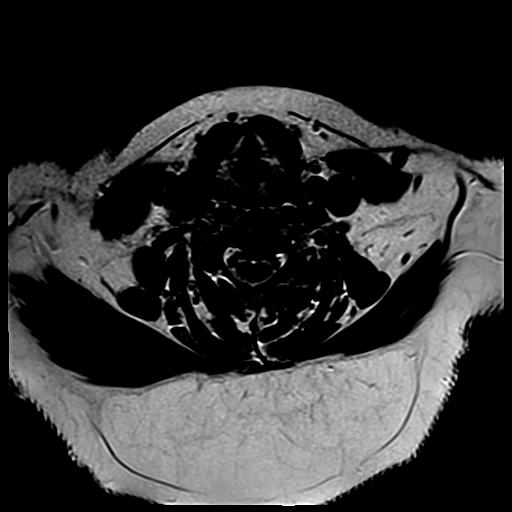
[im 13/18]
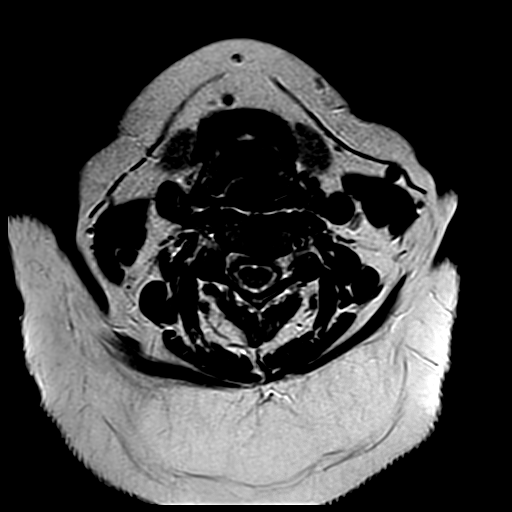
[im 14/18]
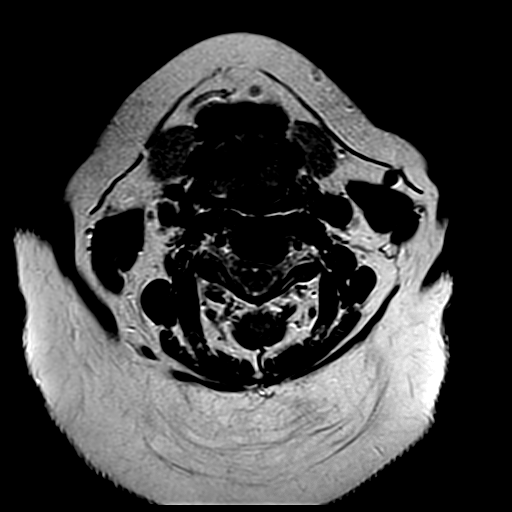
[im 16/18]
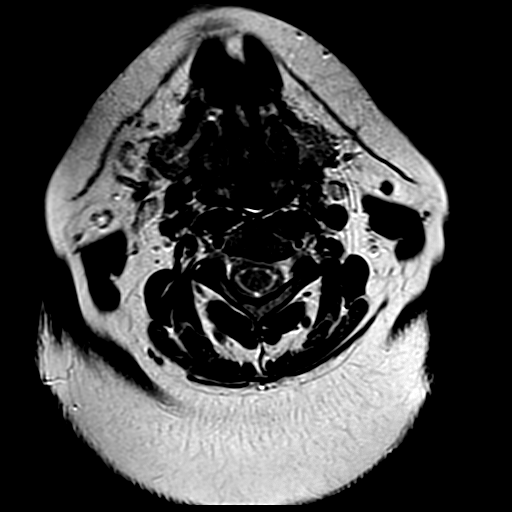
[im 18/18]
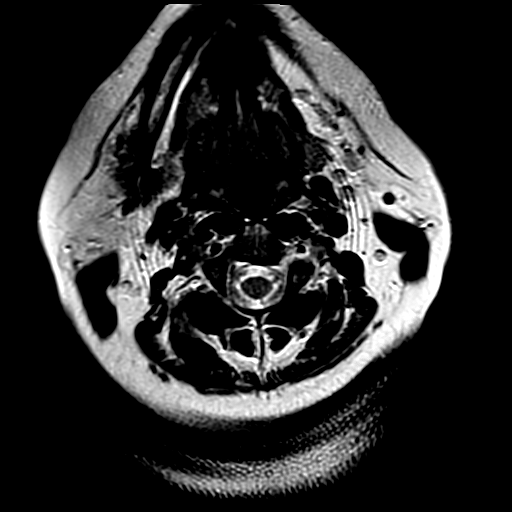

[23 of 48 positions shown; findings below may reference images not displayed]

FINDINGS: No acute bony lesions of cervical vertebrae are seen.  Posterior fossa and foramina magnum structures are normal in the sagittal projection.

At C2-3 level, mild left foraminal narrowing due to asymmetric bulging annulus.

At C3-4 level, no focal disc lesions are seen. 

At C4-5 level, asymmetric bulging annulus to the left is causing minimal left foraminal narrowing.

At C5-6 level, mild left foraminal narrowing due to asymmetric bulging to the left.

At C6-7 level, no focal disc lesions are seen.  C7-T1 disc is normal.

Cervical spinal cord shows no focal abnormalities.  Paravertebral soft tissues are unremarkable.
IMPRESSION: 1. No focal abnormalities of cervical vertebrae.

2. Mild degenerative disc changes at multiple levels as described above in detail at each level. 

3. Cervical spinal cord shows no focal lesions.

------------- REPORT [id number] -------------
﻿EXAM:  [id number]   MRI CERVICAL SPINE WITHOUT CONTRAST
FINDINGS: No acute bony lesions of cervical vertebrae are seen.  Posterior fossa and foramen magnum structures are normal in the sagittal projection.

At C2-3 level, mild left foraminal narrowing due to asymmetric bulging annulus.

At C3-4 level, no focal disc lesions are seen. 

At C4-5 level, asymmetric bulging annulus to the left is causing minimal left foraminal narrowing.

At C5-6 level, mild left foraminal narrowing due to asymmetric bulging to the left.

At C6-7 level, no focal disc lesions are seen.  C7-T1 disc is normal.

Cervical spinal cord shows no focal abnormalities.  Paravertebral soft tissues are unremarkable.
IMPRESSION: 1. No focal abnormalities of cervical vertebrae.

2. Mild degenerative disc changes at multiple levels as described above in detail at each level. 

3. Cervical spinal cord shows no focal lesions.

## 2022-02-11 ENCOUNTER — Ambulatory Visit (HOSPITAL_COMMUNITY): Payer: Self-pay

## 2022-02-16 ENCOUNTER — Encounter (HOSPITAL_COMMUNITY): Payer: Self-pay

## 2022-03-20 ENCOUNTER — Ambulatory Visit (INDEPENDENT_AMBULATORY_CARE_PROVIDER_SITE_OTHER): Payer: Self-pay | Admitting: Neurological Surgery

## 2022-03-30 ENCOUNTER — Other Ambulatory Visit: Payer: Self-pay

## 2022-03-30 ENCOUNTER — Encounter (INDEPENDENT_AMBULATORY_CARE_PROVIDER_SITE_OTHER): Payer: Self-pay | Admitting: Neurological Surgery

## 2022-03-30 ENCOUNTER — Ambulatory Visit: Payer: Worker's Comp, Other unspecified | Attending: Neurological Surgery | Admitting: Neurological Surgery

## 2022-03-30 VITALS — BP 146/96 | HR 99 | Temp 96.4°F | Ht 66.0 in | Wt 342.2 lb

## 2022-03-30 DIAGNOSIS — M79602 Pain in left arm: Secondary | ICD-10-CM | POA: Insufficient documentation

## 2022-03-30 NOTE — Progress Notes (Signed)
[place]  Operated by [place] [place]  Progress Note    Name: Caitlyn Gomez MRN:  [id number]   Date: 03/30/2022 Age: 46 y.o.       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 She was last seen on 03/10/21 (was to RTC in 6 months for clinical f/u). She was seen by a local ENT in [place] for SNHL on the right. On 10/31/2021, the patient visited our clinic for review with an MRI that showed a right vestibular schwannoma. She reported gradual right hearing loss starting in January 2023 with associated tinnitus. She also reported twitching in the OD and h/a with balance issues. Today, the patient is here for a follow up to review a new MRI scan.      Today, the patient reports feelings of weakness, heat, tingling, and pain throughout her left arm from shoulder to index finger. She takes Lyrica to combat the pain. The patient believes these symptoms are a result from a fall from 03/08/2021. She also reports pressure and pain in her neck along with HA. The patient experiences bladder incontinence.    PMH significant for Afib, CAD, HTN, CVA, DM, thyroid nodules, cervical cancer, fibromyalgia, and OU macular degeneration. Recently underwent repair of left rotator cuff tear (has pain with limited ROM). Currently smokes 1/2 PPD x 20 yrs.    Current Outpatient Medications   Medication Sig    apixaban (ELIQUIS) 5 mg Oral Tablet Take 1 Tablet (5 mg total) by mouth Twice daily    atorvastatin (LIPITOR) 40 mg Oral Tablet Take 1 Tablet (40 mg total) by mouth Every evening    bethanechol chloride (URECHOLINE) 5 mg Oral Tablet Take 1 Tablet (5 mg total) by mouth Every night    citalopram (CELEXA) 20 mg Oral Tablet Take 1 Tablet (20 mg total) by mouth Once a day    fluticasone propion-salmeteroL (ADVAIR) 230-21 mcg/actuation  Inhalation oral inhaler Take 2 Puffs by inhalation Twice daily    metoprolol succinate (TOPROL-XL) 50 mg Oral Tablet Sustained Release 24 hr Take 1 Tablet (50 mg total) by mouth Once a day    nitroGLYCERIN (NITROSTAT) 0.3 mg Sublingual Tablet, Sublingual Place 1 Tablet (0.3 mg total) under the tongue Every 5 minutes as needed for Chest pain for 3 doses over 15 minutes    omeprazole (PRILOSEC) 40 mg Oral Capsule, Delayed Release(E.C.) Take 1 Capsule (40 mg total) by mouth Twice daily (Patient taking differently: Take 1 Capsule (40 mg total) by mouth Once a day)    pantoprazole (PROTONIX) 40 mg Oral Tablet, Delayed Release (E.C.) Take 1 Tablet (40 mg total) by mouth Twice daily Take on empty stomach, eat 30-60 mins later    predniSONE (DELTASONE) 10 mg Oral Tablet Take 5 Tablets (50 mg) daily for 4 days, then 4 Tablets (40 mg) daily for 4 days, then 3 Tablets (30 mg) daily for 4 days, then 2 Tablets (20 mg) daily for 4 days, then 1 Tablet (10 mg) daily for 4 days, Then 1/2 Tablet ('5mg'$ ) daily for 4 days (Patient not taking: Reported on 12/02/2021)    pregabalin (LYRICA) 75 mg Oral Capsule Take 1 Capsule (75 mg total) by mouth Three times a day    PROAIR HFA 90 mcg/actuation  Inhalation oral inhaler Take 1 Puff by inhalation Every 4 hours as needed         Objective:   Vital Signs:  BP (!) 146/96   Pulse 99   Temp (!) 35.8 C (96.4 F)   Ht 1.676 m ('5\' 6"'$ )   Wt (!) 155 kg (342 lb 2.5 oz)   LMP 09/07/2009   BMI 55.23 kg/m       Constitutional  General appearance: Normal, no acute distress  HEENT:    HEENT:  Normal  Musculoskeletal  Gait and Station: : Normal  Muscle strength (upper extremities): : Normal  Muscle strength (lower extremities): : Normal  Muscle tone (upper extremities): : Normal  Muscle tone (lower extremities): : Normal  Sensation: Normal  Coordination: Normal       - Left arm: Tremor, pain limitation of movement  Neurological  Orientation: Normal  Recent and remote memory: Normal  Attention span and  concentration: Normal  Language: Normal  Fund of knowledge: Normal    Data reviewed  12/25/2021 EMG (found under media)   Impression:  Electrodiagnostic Evidence of a Severe acute left C7, moderate acute left C6 and C8, mild acute left. Right moderate acute C8 and mild acute right C5,6,7 cervical radiculopathies. She notes that she has a severe fall in August 2022. 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   No electrodiagnostic evidence of any carpal tunnel BIL.   No electrodiagnostic evidence of any ulnar neuropathy BIL.  No electrodiagnostic evidence of any brachial plexopathy BIL.   No evidence of any other never injury or disease noted BIL.     02/03/2022 MRI CERVICAL SPINE WO CONTRAST [facility])  Impression:  No focal abnormalities of cervical vertebrae  Mild degenerative disc changes at multiple levels as described above in detail at each level  Cervical spinal cord shows no focal lesions    Discussions with other providers:     Assessment:    Assessment/Plan   1. Left arm pain      Orders Placed This Encounter    NCS/EMG         Recommendations:  Jonte Wollam is in agreement with the following:   - Referral to neurologist for nerve study  - Follow up on phone   - Patient requests a note for work     I am scribing for, and in the presence of, Dr. Bobby Rumpf for services provided on 03/30/2022.  Lenox Ponds, SCRIBE     *tgscribe*    I personally performed the services described in this documentation, as scribed  in my presence, and it is both accurate  and complete.    Dagmar Hait, MD

## 2022-04-22 ENCOUNTER — Encounter (INDEPENDENT_AMBULATORY_CARE_PROVIDER_SITE_OTHER): Payer: Self-pay | Admitting: Gastroenterology

## 2022-05-22 ENCOUNTER — Other Ambulatory Visit: Payer: Self-pay

## 2022-05-22 ENCOUNTER — Emergency Department
Admission: EM | Admit: 2022-05-22 | Discharge: 2022-05-22 | Disposition: A | Discharge status: Home / Self Care | Payer: Medicaid Other | Attending: Emergency Medicine | Admitting: Emergency Medicine

## 2022-05-22 ENCOUNTER — Encounter (HOSPITAL_COMMUNITY): Payer: Self-pay | Admitting: Emergency Medicine

## 2022-05-22 ENCOUNTER — Emergency Department (HOSPITAL_COMMUNITY): Payer: Medicaid Other

## 2022-05-22 DIAGNOSIS — I4891 Unspecified atrial fibrillation: Secondary | ICD-10-CM | POA: Insufficient documentation

## 2022-05-22 DIAGNOSIS — R0789 Other chest pain: Secondary | ICD-10-CM | POA: Insufficient documentation

## 2022-05-22 DIAGNOSIS — I4510 Unspecified right bundle-branch block: Secondary | ICD-10-CM | POA: Insufficient documentation

## 2022-05-22 DIAGNOSIS — F17210 Nicotine dependence, cigarettes, uncomplicated: Secondary | ICD-10-CM | POA: Insufficient documentation

## 2022-05-22 LAB — CBC WITH DIFF
BASOPHIL #: 0.1 10*3/uL (ref 0.00–0.10)
BASOPHIL %: 1 % (ref 0–1)
EOSINOPHIL #: 0.1 10*3/uL (ref 0.00–0.50)
EOSINOPHIL %: 1 %
HCT: 39.4 % (ref 31.2–41.9)
HGB: 13.2 g/dL (ref 10.9–14.3)
LYMPHOCYTE #: 2.5 10*3/uL (ref 1.00–3.00)
LYMPHOCYTE %: 27 % (ref 16–44)
MCH: 29.1 pg (ref 24.7–32.8)
MCHC: 33.7 g/dL (ref 32.3–35.6)
MCV: 86.4 fL (ref 75.5–95.3)
MONOCYTE #: 0.8 10*3/uL (ref 0.30–1.00)
MONOCYTE %: 9 % (ref 5–13)
MPV: 9 fL (ref 7.9–10.8)
NEUTROPHIL #: 5.9 10*3/uL (ref 1.85–7.80)
NEUTROPHIL %: 63 % (ref 43–77)
PLATELETS: 270 10*3/uL (ref 140–440)
RBC: 4.56 10*6/uL (ref 3.63–4.92)
RDW: 13.9 % (ref 12.3–17.7)
WBC: 9.4 10*3/uL (ref 3.8–11.8)

## 2022-05-22 LAB — COMPREHENSIVE METABOLIC PANEL, NON-FASTING
ALBUMIN/GLOBULIN RATIO: 1.1 (ref 0.8–1.4)
ALBUMIN: 3.9 g/dL (ref 3.5–5.7)
ALKALINE PHOSPHATASE: 78 U/L (ref 34–104)
ALT (SGPT): 23 U/L (ref 7–52)
ANION GAP: 7 mmol/L (ref 4–13)
AST (SGOT): 19 U/L (ref 13–39)
BILIRUBIN TOTAL: 0.3 mg/dL (ref 0.3–1.2)
BUN/CREA RATIO: 10 (ref 6–22)
BUN: 9 mg/dL (ref 7–25)
CALCIUM, CORRECTED: 8.7 mg/dL — ABNORMAL LOW (ref 8.9–10.8)
CALCIUM: 8.6 mg/dL (ref 8.6–10.3)
CHLORIDE: 105 mmol/L (ref 98–107)
CO2 TOTAL: 27 mmol/L (ref 21–31)
CREATININE: 0.93 mg/dL (ref 0.60–1.30)
ESTIMATED GFR: 78 mL/min/{1.73_m2} (ref >59)
GLOBULIN: 3.6 (ref 2.9–5.4)
GLUCOSE: 103 mg/dL (ref 74–109)
OSMOLALITY, CALCULATED: 277 mOsm/kg (ref 270–290)
POTASSIUM: 4.1 mmol/L (ref 3.5–5.1)
PROTEIN TOTAL: 7.5 g/dL (ref 6.4–8.9)
SODIUM: 139 mmol/L (ref 136–145)

## 2022-05-22 LAB — TROPONIN-I
TROPONIN I: 5 ng/L (ref <15)
TROPONIN I: 4 ng/L (ref <15)

## 2022-05-22 MED ORDER — ASPIRIN 81 MG CHEWABLE TABLET
324.0000 mg | CHEWABLE_TABLET | ORAL | Status: AC
Start: 2022-05-22 — End: 2022-05-22
  Administered 2022-05-22: 324 mg via ORAL

## 2022-05-22 MED ORDER — ASPIRIN 81 MG CHEWABLE TABLET
CHEWABLE_TABLET | ORAL | Status: AC
Start: 2022-05-22 — End: 2022-05-22
  Filled 2022-05-22: qty 4

## 2022-05-22 NOTE — Discharge Instructions (Addendum)
Take Tylenol as needed for discomfort  Take all regular medications as prescribed  Follow-up with family doctor for recheck on Monday/Tuesday  Return to emergency room for any recurrence of pain, shortness of breath, or any concerns  Work excuse until Sunday

## 2022-05-22 NOTE — ED Provider Notes (Signed)
[facility]  ED Primary Provider Note        Arrival: The patient arrived by Car     History of Present Illness   chief complaint  Caitlyn Gomez is a 46 y.o. female who had concerns including Chest Pain .  Patient 46 year old female presents range room with chief complaint sharp/pinching chest pain that started this afternoon while at work.  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 she states she is had this intermittent sharp chest pain in the left side of her chest underneath the left breast.  It is reproducible with palpation.  She denies any shortness of breath.  She had no leg or calf tenderness.  She does smoke 1/2 pack of cigarettes per day.  She does have a history of atrial fibrillation in the past.  All nursing notes reviewed        Review of Systems     No other overt Review of Systems are noted to be positive except noted in the HPI.      Historical Data   History Reviewed This Encounter: Medical History  Surgical History  Family History  Social History      Physical Exam   ED Triage Vitals [05/22/22 1809]   BP (Non-Invasive) (!) 168/90   Heart Rate (!) 132   Respiratory Rate 20   Temperature 36.2 C (97.2 F)   SpO2 100 %   Weight    Height          Exam:   Constitutional:  Patient alert orient x3 in no apparent distress.  No limitations.  Morbidly obese  Head: Atraumatic normocephalic  Eyes :  Pupils are equal round reactive to light and accommodation extraocular muscles are intact.  Sclera and conjunctiva are unremarkable  Ears:  Tympanic membranes are pearly gray bilaterally; external auditory canals are unremarkable; external ears without any lesions  Nose:  Nares are patent turbinates are pink and moist  Mouth:  Mucosa is pink and moist without lesions.  Posterior pharynx is pink and moist without hypertrophy/exudate.  Neck:  Soft and supple without palpable lymphadenopathy.  Chest:  Patient is tender to palpation over the left lateral  chest wall.  Palpation in this area completely reproduces symptoms.  It was sharp in nature.  It is approximate the level of the 3/4 intercostal space  Heart:  Regular rate and rhythm without audible murmur  Lungs:  Clear to auscultation bilaterally without any wheezing/rales/rhonchi  Abdomen:  Soft nontender without any rebound or guarding; positive bowel sounds throughout  Genitalia:  Deferred  Skin:  Warm and dry without lesions.  Normal skin turgor.  Brisk capillary refill distally  Extremities:  Good strength bilaterally with full range of motion of upper and lower extremities.  2+ pitting edema to bilateral lower extremity  Neuro:  Alert oriented x3.  Cranial nerves II-XII grossly intact as tested.  Excellent sensation distally over all dermatomes.  Psychiatric:  Patient cooperative, affect appropriate, insight and judgment good          Procedures      Patient Data     Labs Ordered/Reviewed   COMPREHENSIVE METABOLIC PANEL, NON-FASTING - Abnormal; Notable for the following components:       Result Value    CALCIUM, CORRECTED 8.7 (*)     All other components within normal limits    Narrative:     Estimated Glomerular Filtration Rate (eGFR) is calculated using the CKD-EPI (2021)  equation, intended for patients 39 years of age and older. If gender is not documented or "unknown", there will be no eGFR calculation.   TROPONIN-I - Normal   TROPONIN-I - Normal   CBC WITH DIFF - Normal   CBC/DIFF    Narrative:     The following orders were created for panel order CBC/DIFF.  Procedure                               Abnormality         Status                     ---------                               -----------         ------                     CBC WITH BWLS[[id number]]                Normal              Final result                 Please view results for these tests on the individual orders.   TROPONIN-I       XR AP MOBILE CHEST   Final Result by Edi, Radresults In (11/03 1955)   NO ACUTE FINDINGS.          Radiologist location ID: [id number] Decision Making          Medical Decision Making  Troponin x2 negative.  EKG unremarkable.  Single portable view chest shows no acute infiltrate, no acute disease process.  Patient be discharged home.    Amount and/or Complexity of Data Reviewed  Labs: ordered. Decision-making details documented in ED Course.  Radiology: ordered and independent interpretation performed. Decision-making details documented in ED Course.  ECG/medicine tests: ordered and independent interpretation performed. Decision-making details documented in ED Course.    Critical Care  Total time providing critical care: 0 minutes        ED Course as of 05/22/22 2119   [facility] May 22, 2022   1933 EKG shows normal sinus rhythm heart rate of 82, normal axis, normal R-wave progression, no ectopy, no ST elevation/depression, there is incomplete right bundle-branch block with an RR prime in V1.  Normal PR/QRS interval.   2051 CBC and CMP are unremarkable.  Initial troponin is 5   2114 Repeat troponin is 4.  Will discharge patient home   2115 Single portable view chest shows no acute infiltrate, no acute disease process         Medications Administered in the ED   aspirin chewable tablet 324 mg (324 mg Oral Given 05/22/22 1940)       Following the history, physical exam, and ED workup, the patient was deemed stable and suitable for discharge. The patient/caregiver was advised to return to the ED for any new or worsening symptoms. Discharge medications, and follow-up instructions were discussed with the patient/caregiver in detail, who verbalizes understanding. The patient/caregiver is in agreement and is comfortable with the plan of care.    Disposition: Discharged         Current Discharge Medication List  CONTINUE these medications - NO CHANGES were made during your visit.        Details   apixaban 5 mg Tablet  Commonly known as: ELIQUIS   5 mg, Oral, 2 TIMES DAILY  Refills: 0      atorvastatin 40 mg Tablet  Commonly known as: LIPITOR   40 mg, Oral, EVERY EVENING  Refills: 0     bethanechol chloride 5 mg Tablet  Commonly known as: URECHOLINE   1 Tablet, Oral, NIGHTLY  Refills: 0     citalopram 20 mg Tablet  Commonly known as: CeleXA   20 mg, Oral, DAILY  Qty: 30 Tablet  Refills: 1     fluticasone propion-salmeteroL 230-21 mcg/actuation oral inhaler  Commonly known as: ADVAIR   2 Puffs, Inhalation, 2 TIMES DAILY  Qty: 12 g  Refills: 3     metoprolol succinate 50 mg Tablet Sustained Release 24 hr  Commonly known as: TOPROL-XL   1 Tablet, Oral, DAILY  Refills: 0     nitroGLYCERIN 0.3 mg Tablet, Sublingual  Commonly known as: NITROSTAT   0.3 mg, Sublingual, EVERY 5 MIN PRN, for 3 doses over 15 minutes   Refills: 0     omeprazole 40 mg Capsule, Delayed Release(E.C.)  Commonly known as: PRILOSEC   40 mg, Oral, 2 TIMES DAILY  Qty: 90 Capsule  Refills: 4     pantoprazole 40 mg Tablet, Delayed Release (E.C.)  Commonly known as: PROTONIX   40 mg, Oral, 2 TIMES DAILY, Take on empty stomach, eat 30-60 mins later  Qty: 60 Tablet  Refills: 11     predniSONE 10 mg Tablet  Commonly known as: DELTASONE   Take 5 Tablets (50 mg) daily for 4 days, then 4 Tablets (40 mg) daily for 4 days, then 3 Tablets (30 mg) daily for 4 days, then 2 Tablets (20 mg) daily for 4 days, then 1 Tablet (10 mg) daily for 4 days, Then 1/2 Tablet (14m) daily for 4 days  Qty: 62 Tablet  Refills: 0     pregabalin 75 mg Capsule  Commonly known as: LYRICA   75 mg, Oral, 3 TIMES DAILY  Refills: 0     ProAir HFA 90 mcg/actuation oral inhaler  Generic drug: albuterol sulfate   1 Puff, Inhalation, EVERY 4 HOURS PRN  Qty: 6.7 g  Refills: 3            Follow up:   [facility]  [id number] [phone]    In 3 days                   Clinical Impression   Atypical chest pain (Primary)         Current Discharge Medication List          R.A. PBaldwin Jamaica DO  Department of Emergency Medicine

## 2022-05-22 NOTE — ED Triage Notes (Signed)
Nitro x2 with no relief.
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
## 2022-05-22 NOTE — ED Nurses Note (Signed)
Educated pt to take BP before taking nitro. Discussed life stressors and pt stated she is planning on seeing a counselor. PT ambulated out of ED with discharge instructions and belongings.

## 2022-05-23 DIAGNOSIS — I4510 Unspecified right bundle-branch block: Secondary | ICD-10-CM

## 2022-05-23 LAB — ECG 12 LEAD
Atrial Rate: 82 {beats}/min
Calculated P Axis: 63 degrees
Calculated R Axis: 20 degrees
Calculated T Axis: 16 degrees
PR Interval: 150 ms
QRS Duration: 108 ms
QT Interval: 404 ms
QTC Calculation: 472 ms
Ventricular rate: 82 {beats}/min

## 2022-06-04 ENCOUNTER — Emergency Department (HOSPITAL_COMMUNITY): Payer: Auto Insurance (includes no fault)

## 2022-06-04 ENCOUNTER — Other Ambulatory Visit: Payer: Self-pay

## 2022-06-04 ENCOUNTER — Encounter (HOSPITAL_COMMUNITY): Payer: Self-pay

## 2022-06-04 ENCOUNTER — Emergency Department
Admission: EM | Admit: 2022-06-04 | Discharge: 2022-06-04 | Disposition: A | Discharge status: Home / Self Care | Payer: Auto Insurance (includes no fault) | Attending: Emergency Medicine | Admitting: Emergency Medicine

## 2022-06-04 DIAGNOSIS — M25512 Pain in left shoulder: Secondary | ICD-10-CM | POA: Insufficient documentation

## 2022-06-04 DIAGNOSIS — M549 Dorsalgia, unspecified: Secondary | ICD-10-CM | POA: Insufficient documentation

## 2022-06-04 DIAGNOSIS — S20211A Contusion of right front wall of thorax, initial encounter: Secondary | ICD-10-CM | POA: Insufficient documentation

## 2022-06-04 DIAGNOSIS — Y92410 Unspecified street and highway as the place of occurrence of the external cause: Secondary | ICD-10-CM | POA: Insufficient documentation

## 2022-06-04 DIAGNOSIS — F17210 Nicotine dependence, cigarettes, uncomplicated: Secondary | ICD-10-CM | POA: Insufficient documentation

## 2022-06-04 DIAGNOSIS — M62830 Muscle spasm of back: Secondary | ICD-10-CM | POA: Insufficient documentation

## 2022-06-04 DIAGNOSIS — R519 Headache, unspecified: Secondary | ICD-10-CM | POA: Insufficient documentation

## 2022-06-04 DIAGNOSIS — M79605 Pain in left leg: Secondary | ICD-10-CM | POA: Insufficient documentation

## 2022-06-04 MED ORDER — ONDANSETRON HCL (PF) 4 MG/2 ML INJECTION SOLUTION
INTRAMUSCULAR | Status: AC
Start: 2022-06-04 — End: 2022-06-04
  Filled 2022-06-04: qty 2

## 2022-06-04 MED ORDER — FENTANYL (PF) 50 MCG/ML INJECTION SOLUTION
50.0000 ug | INTRAMUSCULAR | Status: AC
Start: 2022-06-04 — End: 2022-06-04
  Administered 2022-06-04: 50 ug via INTRAVENOUS

## 2022-06-04 MED ORDER — ONDANSETRON HCL (PF) 4 MG/2 ML INJECTION SOLUTION
4.0000 mg | INTRAMUSCULAR | Status: AC
Start: 2022-06-04 — End: 2022-06-04
  Administered 2022-06-04: 4 mg via INTRAVENOUS

## 2022-06-04 MED ORDER — FENTANYL (PF) 50 MCG/ML INJECTION SOLUTION
INTRAMUSCULAR | Status: AC
Start: 2022-06-04 — End: 2022-06-04
  Filled 2022-06-04: qty 2

## 2022-06-04 NOTE — Discharge Instructions (Addendum)
Follow-up with primary care and orthopedics.     Orthopedic     Dr. Josph Macho Morgan/Dr. Truman Hayward. Rebeca Alert   [place]  [phone]    Dr. Irven Easterly   [address] [place] Doffing [place]   [phone]      Apply heat ice to painful areas as desired, 10 minutes on at least 10 minutes off.      Acetaminophen or ibuprofen as needed and as directed for pain.      Return to the emergency department if worse and as needed.

## 2022-06-04 NOTE — ED Triage Notes (Signed)
REAR-ENDED WHILE STOPPED TO TURN, C/O HEAD, NECK AND LEFT SHOULDER PAIN, POSSIBLE DEFORMITY.   22G RIGHT HAND, IMMOBILIZED, PT IS ON ELIQUIS.

## 2022-06-04 NOTE — ED Nurses Note (Signed)
Provider made aware that patient does not have access for CT W contrast. 20g started in upper right chest was infiltrated upon assessment.

## 2022-06-04 NOTE — ED Nurses Note (Signed)
Patient in room with family member. patient is tearful at this time and is reassured by family member.

## 2022-06-04 NOTE — ED Nurses Note (Signed)
Sling applied to left shoulder
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
## 2022-06-04 NOTE — ED Nurses Note (Signed)
More patient family in room. Patient given iv pain medications and medication for nausea. Patient and family educated on medications given. Patient and patient family members educated about not moving patients left arm before scans are done.
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
## 2022-06-04 NOTE — ED Nurses Note (Signed)
C collar taken off patient at this time. Dr. Sabra Heck cleared patient.

## 2022-06-10 ENCOUNTER — Ambulatory Visit (HOSPITAL_COMMUNITY): Payer: Self-pay

## 2022-06-10 ENCOUNTER — Other Ambulatory Visit: Payer: Self-pay

## 2022-06-10 ENCOUNTER — Encounter (HOSPITAL_COMMUNITY): Payer: Self-pay

## 2022-06-10 ENCOUNTER — Emergency Department
Admission: EM | Admit: 2022-06-10 | Discharge: 2022-06-10 | Disposition: A | Discharge status: Home / Self Care | Payer: Medicaid Other | Attending: Nurse Practitioner | Admitting: Nurse Practitioner

## 2022-06-10 ENCOUNTER — Emergency Department (HOSPITAL_COMMUNITY): Payer: Medicaid Other

## 2022-06-10 DIAGNOSIS — R339 Retention of urine, unspecified: Secondary | ICD-10-CM | POA: Insufficient documentation

## 2022-06-10 DIAGNOSIS — R39198 Other difficulties with micturition: Secondary | ICD-10-CM

## 2022-06-10 LAB — CBC WITH DIFF
BASOPHIL #: 0.1 10*3/uL (ref 0.00–0.10)
BASOPHIL %: 1 % (ref 0–1)
EOSINOPHIL #: 0.1 10*3/uL (ref 0.00–0.50)
EOSINOPHIL %: 2 %
HCT: 39.5 % (ref 31.2–41.9)
HGB: 13.1 g/dL (ref 10.9–14.3)
LYMPHOCYTE #: 2.1 10*3/uL (ref 1.00–3.00)
LYMPHOCYTE %: 33 % (ref 16–44)
MCH: 28.7 pg (ref 24.7–32.8)
MCHC: 33.1 g/dL (ref 32.3–35.6)
MCV: 86.8 fL (ref 75.5–95.3)
MONOCYTE #: 0.7 10*3/uL (ref 0.30–1.00)
MONOCYTE %: 11 % (ref 5–13)
MPV: 9.1 fL (ref 7.9–10.8)
NEUTROPHIL #: 3.5 10*3/uL (ref 1.85–7.80)
NEUTROPHIL %: 53 % (ref 43–77)
PLATELETS: 271 10*3/uL (ref 140–440)
RBC: 4.55 10*6/uL (ref 3.63–4.92)
RDW: 13.9 % (ref 12.3–17.7)
WBC: 6.5 10*3/uL (ref 3.8–11.8)

## 2022-06-10 LAB — COMPREHENSIVE METABOLIC PANEL, NON-FASTING
ALBUMIN/GLOBULIN RATIO: 1.1 (ref 0.8–1.4)
ALBUMIN: 4 g/dL (ref 3.5–5.7)
ALKALINE PHOSPHATASE: 75 U/L (ref 34–104)
ALT (SGPT): 24 U/L (ref 7–52)
ANION GAP: 7 mmol/L (ref 4–13)
AST (SGOT): 23 U/L (ref 13–39)
BILIRUBIN TOTAL: 0.5 mg/dL (ref 0.3–1.2)
BUN/CREA RATIO: 13 (ref 6–22)
BUN: 11 mg/dL (ref 7–25)
CALCIUM, CORRECTED: 8.7 mg/dL — ABNORMAL LOW (ref 8.9–10.8)
CALCIUM: 8.7 mg/dL (ref 8.6–10.3)
CHLORIDE: 103 mmol/L (ref 98–107)
CO2 TOTAL: 28 mmol/L (ref 21–31)
CREATININE: 0.82 mg/dL (ref 0.60–1.30)
ESTIMATED GFR: 90 mL/min/{1.73_m2} (ref >59)
GLOBULIN: 3.5 (ref 2.9–5.4)
GLUCOSE: 88 mg/dL (ref 74–109)
OSMOLALITY, CALCULATED: 275 mOsm/kg (ref 270–290)
POTASSIUM: 3.9 mmol/L (ref 3.5–5.1)
PROTEIN TOTAL: 7.5 g/dL (ref 6.4–8.9)
SODIUM: 138 mmol/L (ref 136–145)

## 2022-06-10 LAB — URINALYSIS, MACROSCOPIC
BILIRUBIN: NEGATIVE mg/dL
BLOOD: NEGATIVE mg/dL
GLUCOSE: NEGATIVE mg/dL
KETONES: NEGATIVE mg/dL
LEUKOCYTES: NEGATIVE WBCs/uL
NITRITE: NEGATIVE
PH: 6.5 (ref 5.0–9.0)
PROTEIN: 10 mg/dL
SPECIFIC GRAVITY: 1.028 (ref 1.002–1.030)
UROBILINOGEN: 2 mg/dL — AB

## 2022-06-10 LAB — URINALYSIS, MICROSCOPIC
RBCS: 4 /hpf — ABNORMAL HIGH (ref <4)
SQUAMOUS EPITHELIAL: 5 /hpf (ref <28)
WBCS: 1 /hpf (ref <6)

## 2022-06-10 MED ORDER — SODIUM CHLORIDE 0.9 % (FLUSH) INJECTION SYRINGE
3.0000 mL | INJECTION | INTRAMUSCULAR | Status: DC | PRN
Start: 2022-06-10 — End: 2022-06-10

## 2022-06-10 MED ORDER — SODIUM CHLORIDE 0.9 % IV BOLUS
1000.0000 mL | INJECTION | Status: AC
Start: 2022-06-10 — End: 2022-06-10
  Administered 2022-06-10: 0 mL via INTRAVENOUS
  Administered 2022-06-10: 1000 mL via INTRAVENOUS

## 2022-06-10 MED ORDER — SODIUM CHLORIDE 0.9 % (FLUSH) INJECTION SYRINGE
3.0000 mL | INJECTION | Freq: Three times a day (TID) | INTRAMUSCULAR | Status: DC
Start: 2022-06-10 — End: 2022-06-10
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
## 2022-06-10 NOTE — ED Nurses Note (Signed)
Bladder scan done results 125 ml

## 2022-06-10 NOTE — Discharge Instructions (Addendum)
Drink plenty of fluids.  Continue home medications as directed.  Follow-up with the urologist in 1-3 days by calling their office within the next 24 hours to arrange a follow-up appointment.  Return to the emergency department if worse or as needed.  We hope you feel better.

## 2022-06-10 NOTE — ED Nurses Note (Signed)
Patient discharged home with family.  AVS reviewed with patient/care giver.  A written copy of the AVS and discharge instructions was given to the patient/care giver.  Questions sufficiently answered as needed.  Patient/care giver encouraged to follow up with PCP as indicated.  In the event of an emergency, patient/care giver instructed to call 911 or go to the nearest emergency room.

## 2022-06-22 ENCOUNTER — Other Ambulatory Visit (HOSPITAL_COMMUNITY): Payer: Self-pay | Admitting: NURSE PRACTITIONER

## 2022-06-22 DIAGNOSIS — M25512 Pain in left shoulder: Secondary | ICD-10-CM

## 2022-06-23 ENCOUNTER — Other Ambulatory Visit: Payer: Self-pay

## 2022-06-23 ENCOUNTER — Ambulatory Visit
Admission: RE | Admit: 2022-06-23 | Discharge: 2022-06-23 | Disposition: A | Discharge status: Home / Self Care | Payer: Medicaid Other | Source: Ambulatory Visit | Attending: NURSE PRACTITIONER | Admitting: NURSE PRACTITIONER

## 2022-06-23 DIAGNOSIS — M25512 Pain in left shoulder: Secondary | ICD-10-CM | POA: Insufficient documentation

## 2022-07-17 ENCOUNTER — Ambulatory Visit (HOSPITAL_COMMUNITY): Payer: Self-pay

## 2022-07-22 ENCOUNTER — Other Ambulatory Visit: Payer: Self-pay

## 2022-07-22 ENCOUNTER — Ambulatory Visit (HOSPITAL_COMMUNITY)
Admission: RE | Admit: 2022-07-22 | Discharge: 2022-07-22 | Disposition: A | Discharge status: Still In Hospital | Payer: Medicaid Other | Source: Ambulatory Visit | Attending: NURSE PRACTITIONER | Admitting: NURSE PRACTITIONER

## 2022-07-22 DIAGNOSIS — M25562 Pain in left knee: Secondary | ICD-10-CM | POA: Insufficient documentation

## 2022-07-22 DIAGNOSIS — M25512 Pain in left shoulder: Secondary | ICD-10-CM
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
## 2022-07-26 ENCOUNTER — Emergency Department (HOSPITAL_COMMUNITY): Payer: Medicaid Other

## 2022-07-26 ENCOUNTER — Other Ambulatory Visit: Payer: Self-pay

## 2022-07-26 ENCOUNTER — Encounter (HOSPITAL_COMMUNITY): Payer: Self-pay

## 2022-07-26 ENCOUNTER — Emergency Department
Admission: EM | Admit: 2022-07-26 | Discharge: 2022-07-26 | Disposition: A | Discharge status: Home / Self Care | Payer: Medicaid Other | Attending: Student in an Organized Health Care Education/Training Program | Admitting: Student in an Organized Health Care Education/Training Program

## 2022-07-26 DIAGNOSIS — J101 Influenza due to other identified influenza virus with other respiratory manifestations: Secondary | ICD-10-CM

## 2022-07-26 DIAGNOSIS — J111 Influenza due to unidentified influenza virus with other respiratory manifestations: Secondary | ICD-10-CM | POA: Insufficient documentation

## 2022-07-26 DIAGNOSIS — Z1152 Encounter for screening for COVID-19: Secondary | ICD-10-CM | POA: Insufficient documentation

## 2022-07-26 LAB — COVID-19, FLU A/B, RSV RAPID BY PCR
INFLUENZA VIRUS TYPE A: DETECTED — AB
INFLUENZA VIRUS TYPE B: NOT DETECTED
RESPIRATORY SYNCTIAL VIRUS (RSV): NOT DETECTED
SARS-CoV-2: NOT DETECTED

## 2022-07-26 LAB — RAPID THROAT SCREEN, STREPTOCOCCUS, WITH REFLEX: THROAT RAPID SCREEN, STREPTOCOCCUS: NEGATIVE

## 2022-07-26 MED ORDER — PROMETHAZINE-DM 6.25 MG-15 MG/5 ML ORAL SYRUP
5.0000 mL | ORAL_SOLUTION | Freq: Four times a day (QID) | ORAL | 0 refills | Status: DC | PRN
Start: 2022-07-26 — End: 2022-07-26

## 2022-07-26 MED ORDER — PROMETHAZINE-DM 6.25 MG-15 MG/5 ML ORAL SYRUP
5.0000 mL | ORAL_SOLUTION | Freq: Four times a day (QID) | ORAL | 0 refills | Status: DC | PRN
Start: 2022-07-26 — End: 2022-11-04

## 2022-07-26 MED ORDER — AZITHROMYCIN 500 MG TABLET
500.0000 mg | ORAL_TABLET | Freq: Every day | ORAL | 0 refills | Status: AC
Start: 2022-07-26 — End: 2022-07-29

## 2022-07-26 NOTE — ED Nurses Note (Signed)
Patient discharged home with family.  AVS reviewed with patient/care giver.  A written copy of the AVS and discharge instructions was given to the patient/care giver.  Questions sufficiently answered as needed.  Patient/care giver encouraged to follow up with PCP as indicated.  In the event of an emergency, patient/care giver instructed to call 911 or go to the nearest emergency room.

## 2022-07-26 NOTE — ED Provider Notes (Signed)
[facility]  ED Primary Provider Note  History of Present Illness   Chief Complaint   Patient presents with    Flu Like Symptoms     Caitlyn Gomez is a 47 y.o. female who had concerns including Flu Like Symptoms.  Arrival: The patient arrived by Car    Patient is a 47 year old female arrives today complaining of cough congestion body aches fever rhinorrhea patient reports productive cough green in color as well as nasal drainage is green in color.  Patient reports she works at [employer] coworkers had COVID, [place] B as well as RSV.  Patient states she was sent home from work today.  Does endorse "coughing fits".  Denies chest pain shortness of breath nausea vomiting diarrhea      History Reviewed This Encounter:      Physical Exam   ED Triage Vitals [07/26/22 1107]   BP (Non-Invasive) (!) 167/75   Heart Rate (!) 103   Respiratory Rate (!) 22   Temperature 37.4 C (99.4 F)   SpO2 96 %   Weight (!) 149 kg (328 lb)   Height 1.707 m (5' 7.2")     Physical Exam  Vitals and nursing note reviewed.   Constitutional:       Appearance: Normal appearance. She is obese.   HENT:      Head: Normocephalic and atraumatic.   Cardiovascular:      Rate and Rhythm: Normal rate and regular rhythm.      Pulses: Normal pulses.      Heart sounds: Normal heart sounds.   Pulmonary:      Effort: Pulmonary effort is normal. No respiratory distress.      Breath sounds: Normal breath sounds. No stridor. No wheezing, rhonchi or rales.   Chest:      Chest wall: No tenderness.   Musculoskeletal:         General: Normal range of motion.      Right lower leg: No edema.      Left lower leg: No edema.   Skin:     General: Skin is warm.      Capillary Refill: Capillary refill takes less than 2 seconds.   Neurological:      General: No focal deficit present.      Mental Status: She is alert and oriented to person, place, and time. Mental status is at baseline.       Patient Data     Labs  Ordered/Reviewed   COVID-19, FLU A/B, RSV RAPID BY PCR - Abnormal; Notable for the following components:       Result Value    INFLUENZA VIRUS TYPE A Detected (*)     All other components within normal limits    Narrative:     Results are for the simultaneous qualitative identification of SARS-CoV-2 (formerly 2019-nCoV), Influenza A, Influenza B, and RSV RNA. These etiologic agents are generally detectable in nasopharyngeal and nasal swabs during the ACUTE PHASE of infection. Hence, this test is intended to be performed on respiratory specimens collected from individuals with signs and symptoms of upper respiratory tract infection who meet Centers for Disease Control and Prevention (CDC) clinical and/or epidemiological criteria for Coronavirus Disease 2019 (COVID-19) testing. CDC COVID-19 criteria for testing on human specimens is available at [facility] webpage information for Healthcare Professionals: Coronavirus Disease 2019 (COVID-19) ([employer]).     False-negative results may occur if the virus has genomic mutations, insertions, deletions, or rearrangements or  if performed very early in the course of illness. Otherwise, negative results indicate virus specific RNA targets are not detected, however negative results do not preclude SARS-CoV-2 infection/COVID-19, Influenza, or Respiratory syncytial virus infection. Results should not be used as the sole basis for patient management decisions. Negative results must be combined with clinical observations, patient history, and epidemiological information. If upper respiratory tract infection is still suspected based on exposure history together with other clinical findings, re-testing should be considered.    Disclaimer:   This assay has been authorized by FDA under an Emergency Use Authorization for use in laboratories certified under the Clinical Laboratory Improvement Amendments of 1988 (CLIA), 42 U.S.C. ([phone], to perform  high complexity tests. The impacts of vaccines, antiviral therapeutics, antibiotics, chemotherapeutic or immunosuppressant drugs have not been evaluated.     Test methodology:   Cepheid Xpert Xpress SARS-CoV-2/Flu/RSV Assay real-time polymerase chain reaction (RT-PCR) test on the GeneXpert Dx and Xpert Xpress systems.   RAPID THROAT SCREEN, STREPTOCOCCUS, WITH REFLEX - Normal    Narrative:     Read-Now Mode  Walk-Away Mode   THROAT CULTURE, BETA HEMOLYTIC STREPTOCOCCUS     XR AP MOBILE CHEST   Final Result by Edi, Radresults In (01/07 1237)   Subtle lower lung predominant opacities may be infectious or inflammatory         Radiologist location ID: [place] Making        Medical Decision Making  Strep COVID RSV negative.  Patient tested positive for influenza A at this time.  Patient's chest x-ray showed subtle while lower lung predominant opacities may be infectious or inflammatory. Did discuss treatment options regarding symptomatic treatment at home versus Tamiflu patient was made aware of risks and benefits of Tamiflu elected to proceed with symptomatic treatment at home.  Although patient did want to proceed with empirical treatment with antibiotics for chest x-ray finding as well as productive cough.  Patient was prescribed azithromycin.  Patient did receive cough suppressant prescription.  Patient was given strict ED return precautions.  All questions and concerns addressed.    Amount and/or Complexity of Data Reviewed  Radiology: ordered.  ECG/medicine tests: independent interpretation performed.    Risk  Prescription drug management.      ED Course as of 07/26/22 1247   Sun Jul 26, 2022   1130 THROAT RAPID SCREEN, STREPTOCOCCUS: Negative   1130 THROAT CULTURE, BETA HEMOLYTIC STREPTOCOCCUS  Pending     1219 INFLUENZA VIRUS TYPE A(!): Detected            Clinical Impression   Influenza (Primary)       Disposition: Discharged

## 2022-07-26 NOTE — Discharge Instructions (Addendum)
Drink plenty of fluids targeting 64 oz of Gatorade Pedialyte water or body armor.  Take Tylenol and/or Motrin as needed for fever and pain control.  Take cough suppressant as directed as needed.  Be aware this cough suppressant can cause drowsiness do not operate vehicles or profound arm high-risk activities while taking cough suppressant.  Follow up with your PCP for close CV follow-up.  Return emergency department for new or worsening symptoms that concern you.    Thank you for allowing [place] to be part of your care.    Please discuss all medications with your pharmacist to ensure there are no concerns of interactions.    Please ensure all questions or concerns are addressed prior to leaving the hospital. We want to make sure your concerns are addressed to make sure you are as safe and healthy as possible. By leaving the hospital, it is understood you are in agreement with your treatment plan.    You may have received sedating medication during your visit. Please discuss this with your discharging provider nurse as you may not be able to operate machines while the medication is in your system, or while you are taking any potentially sedating prescriptions.    Please call the hospital medical records office for a copy of your finalized results, and review them with a primary care physician, for any findings needing further attention.    If you feel your situation worsens, or does not get better in 48 hours, please see a physician for evaluation.    We encourage you to see your regular doctor as soon as possible to let them know you were seen in the emergency department. They may want to do further testing. If you do not have a doctor, please feel free to call the hospital, and ask for contact information of accepting providers. Please also discuss your vaccinations, and ensure all are up to date.    You may use this document to take today off work or school.

## 2022-07-26 NOTE — ED Triage Notes (Signed)
Flu like started yesterday body aches chill . Light headed

## 2022-07-27 ENCOUNTER — Ambulatory Visit (HOSPITAL_COMMUNITY): Payer: Self-pay

## 2022-07-28 LAB — THROAT CULTURE, BETA HEMOLYTIC STREPTOCOCCUS: THROAT CULTURE: NORMAL

## 2022-07-29 ENCOUNTER — Ambulatory Visit (HOSPITAL_COMMUNITY): Payer: Self-pay

## 2022-07-30 ENCOUNTER — Emergency Department
Admission: EM | Admit: 2022-07-30 | Discharge: 2022-07-30 | Disposition: A | Discharge status: Home / Self Care | Payer: Medicaid Other | Attending: Family | Admitting: Family

## 2022-07-30 ENCOUNTER — Emergency Department (HOSPITAL_COMMUNITY): Payer: Medicaid Other

## 2022-07-30 ENCOUNTER — Other Ambulatory Visit: Payer: Self-pay

## 2022-07-30 ENCOUNTER — Encounter (HOSPITAL_COMMUNITY): Payer: Self-pay | Admitting: Family

## 2022-07-30 DIAGNOSIS — J189 Pneumonia, unspecified organism: Secondary | ICD-10-CM | POA: Insufficient documentation

## 2022-07-30 DIAGNOSIS — J069 Acute upper respiratory infection, unspecified: Secondary | ICD-10-CM

## 2022-07-30 DIAGNOSIS — J101 Influenza due to other identified influenza virus with other respiratory manifestations: Secondary | ICD-10-CM | POA: Insufficient documentation

## 2022-07-30 DIAGNOSIS — Z87891 Personal history of nicotine dependence: Secondary | ICD-10-CM | POA: Insufficient documentation

## 2022-07-30 DIAGNOSIS — J441 Chronic obstructive pulmonary disease with (acute) exacerbation: Secondary | ICD-10-CM

## 2022-07-30 DIAGNOSIS — R599 Enlarged lymph nodes, unspecified: Secondary | ICD-10-CM | POA: Insufficient documentation

## 2022-07-30 DIAGNOSIS — Z1152 Encounter for screening for COVID-19: Secondary | ICD-10-CM | POA: Insufficient documentation

## 2022-07-30 DIAGNOSIS — J440 Chronic obstructive pulmonary disease with acute lower respiratory infection: Secondary | ICD-10-CM | POA: Insufficient documentation

## 2022-07-30 DIAGNOSIS — Z9889 Other specified postprocedural states: Secondary | ICD-10-CM

## 2022-07-30 LAB — COMPREHENSIVE METABOLIC PANEL, NON-FASTING
ALBUMIN/GLOBULIN RATIO: 0.9 (ref 0.8–1.4)
ALBUMIN: 3.5 g/dL (ref 3.5–5.7)
ALKALINE PHOSPHATASE: 70 U/L (ref 34–104)
ALT (SGPT): 21 U/L (ref 7–52)
ANION GAP: 5 mmol/L (ref 4–13)
AST (SGOT): 19 U/L (ref 13–39)
BILIRUBIN TOTAL: 0.3 mg/dL (ref 0.3–1.2)
BUN/CREA RATIO: 14 (ref 6–22)
BUN: 12 mg/dL (ref 7–25)
CALCIUM, CORRECTED: 8.9 mg/dL (ref 8.9–10.8)
CALCIUM: 8.5 mg/dL — ABNORMAL LOW (ref 8.6–10.3)
CHLORIDE: 105 mmol/L (ref 98–107)
CO2 TOTAL: 28 mmol/L (ref 21–31)
CREATININE: 0.84 mg/dL (ref 0.60–1.30)
ESTIMATED GFR: 88 mL/min/{1.73_m2} (ref >59)
GLOBULIN: 3.7 (ref 2.9–5.4)
GLUCOSE: 85 mg/dL (ref 74–109)
OSMOLALITY, CALCULATED: 275 mOsm/kg (ref 270–290)
POTASSIUM: 4.1 mmol/L (ref 3.5–5.1)
PROTEIN TOTAL: 7.2 g/dL (ref 6.4–8.9)
SODIUM: 138 mmol/L (ref 136–145)

## 2022-07-30 LAB — CBC WITH DIFF
BASOPHIL #: 0 10*3/uL (ref 0.00–0.10)
BASOPHIL %: 1 % (ref 0–1)
EOSINOPHIL #: 0.1 10*3/uL (ref 0.00–0.50)
EOSINOPHIL %: 2 %
HCT: 38.5 % (ref 31.2–41.9)
HGB: 12.7 g/dL (ref 10.9–14.3)
LYMPHOCYTE #: 1.1 10*3/uL (ref 1.00–3.00)
LYMPHOCYTE %: 18 % (ref 16–44)
MCH: 28.4 pg (ref 24.7–32.8)
MCHC: 33 g/dL (ref 32.3–35.6)
MCV: 86.3 fL (ref 75.5–95.3)
MONOCYTE #: 0.9 10*3/uL (ref 0.30–1.00)
MONOCYTE %: 13 % (ref 5–13)
MPV: 9.1 fL (ref 7.9–10.8)
NEUTROPHIL #: 4.3 10*3/uL (ref 1.85–7.80)
NEUTROPHIL %: 66 % (ref 43–77)
PLATELETS: 240 10*3/uL (ref 140–440)
RBC: 4.46 10*6/uL (ref 3.63–4.92)
RDW: 13.3 % (ref 12.3–17.7)
WBC: 6.4 10*3/uL (ref 3.8–11.8)

## 2022-07-30 LAB — URINALYSIS, MICROSCOPIC
RBCS: 2 /hpf (ref <4)
SQUAMOUS EPITHELIAL: 6 /hpf (ref <28)
WBCS: 1 /hpf (ref <6)

## 2022-07-30 LAB — LACTIC ACID LEVEL W/ REFLEX FOR LEVEL >2.0: LACTIC ACID: 0.5 mmol/L (ref 0.5–2.2)

## 2022-07-30 LAB — URINALYSIS, MACROSCOPIC
BILIRUBIN: NEGATIVE mg/dL
BLOOD: NEGATIVE mg/dL
GLUCOSE: NEGATIVE mg/dL
KETONES: NEGATIVE mg/dL
LEUKOCYTES: NEGATIVE WBCs/uL
NITRITE: NEGATIVE
PH: 7 (ref 5.0–9.0)
PROTEIN: NEGATIVE mg/dL
SPECIFIC GRAVITY: 1.022 (ref 1.002–1.030)
UROBILINOGEN: NORMAL mg/dL

## 2022-07-30 LAB — GOLD TOP TUBE

## 2022-07-30 LAB — D-DIMER: D-DIMER: 962 ng/mL FEU (ref 215–500)

## 2022-07-30 LAB — COVID-19, FLU A/B, RSV RAPID BY PCR
INFLUENZA VIRUS TYPE A: DETECTED — AB
INFLUENZA VIRUS TYPE B: NOT DETECTED
RESPIRATORY SYNCTIAL VIRUS (RSV): NOT DETECTED
SARS-CoV-2: NOT DETECTED

## 2022-07-30 LAB — B-TYPE NATRIURETIC PEPTIDE (BNP),PLASMA: BNP: 72 pg/mL (ref 5–100)

## 2022-07-30 MED ORDER — METHYLPREDNISOLONE SOD SUCC 125 MG SOLUTION FOR INJECTION WRAPPER
INTRAVENOUS | Status: AC
Start: 2022-07-30 — End: 2022-07-30
  Filled 2022-07-30: qty 2

## 2022-07-30 MED ORDER — ONDANSETRON 4 MG DISINTEGRATING TABLET
4.0000 mg | ORAL_TABLET | Freq: Three times a day (TID) | ORAL | 0 refills | Status: DC | PRN
Start: 2022-07-30 — End: 2022-07-30

## 2022-07-30 MED ORDER — ONDANSETRON 4 MG DISINTEGRATING TABLET
ORAL_TABLET | ORAL | Status: AC
Start: 2022-07-30 — End: 2022-07-30
  Filled 2022-07-30: qty 1

## 2022-07-30 MED ORDER — METHYLPREDNISOLONE 4 MG TABLETS IN A DOSE PACK
ORAL_TABLET | ORAL | 0 refills | Status: DC
Start: 2022-07-30 — End: 2022-11-03

## 2022-07-30 MED ORDER — IPRATROPIUM 0.5 MG-ALBUTEROL 3 MG (2.5 MG BASE)/3 ML NEBULIZATION SOLN
3.0000 mL | INHALATION_SOLUTION | Freq: Four times a day (QID) | RESPIRATORY_TRACT | 0 refills | Status: DC
Start: 2022-07-30 — End: 2022-07-30

## 2022-07-30 MED ORDER — METHYLPREDNISOLONE SOD SUCC 125 MG SOLUTION FOR INJECTION WRAPPER
125.0000 mg | INTRAVENOUS | Status: AC
Start: 2022-07-30 — End: 2022-07-30
  Administered 2022-07-30: 125 mg via INTRAVENOUS

## 2022-07-30 MED ORDER — IOHEXOL 350 MG IODINE/ML INTRAVENOUS SOLUTION
100.0000 mL | INTRAVENOUS | Status: AC
Start: 2022-07-30 — End: 2022-07-30
  Administered 2022-07-30: 100 mL via INTRAVENOUS

## 2022-07-30 MED ORDER — IPRATROPIUM 0.5 MG-ALBUTEROL 3 MG (2.5 MG BASE)/3 ML NEBULIZATION SOLN: 3.0000 mL | INHALATION_SOLUTION | Freq: Four times a day (QID) | RESPIRATORY_TRACT | 0 refills | Status: AC

## 2022-07-30 MED ORDER — ONDANSETRON 4 MG DISINTEGRATING TABLET
4.0000 mg | ORAL_TABLET | Freq: Three times a day (TID) | ORAL | 0 refills | Status: DC | PRN
Start: 2022-07-30 — End: 2022-12-16

## 2022-07-30 MED ORDER — ONDANSETRON 4 MG DISINTEGRATING TABLET
4.0000 mg | ORAL_TABLET | ORAL | Status: AC
Start: 2022-07-30 — End: 2022-07-30
  Administered 2022-07-30: 4 mg via ORAL

## 2022-07-30 MED ORDER — IPRATROPIUM 0.5 MG-ALBUTEROL 3 MG (2.5 MG BASE)/3 ML NEBULIZATION SOLN
3.0000 mL | INHALATION_SOLUTION | RESPIRATORY_TRACT | Status: AC
Start: 2022-07-30 — End: 2022-07-30
  Administered 2022-07-30: 3 mL via RESPIRATORY_TRACT

## 2022-07-30 MED ORDER — METHYLPREDNISOLONE 4 MG TABLETS IN A DOSE PACK
ORAL_TABLET | ORAL | 0 refills | Status: DC
Start: 2022-07-30 — End: 2022-07-30

## 2022-07-30 NOTE — ED Provider Notes (Signed)
[facility]  ED Primary Provider Note  History of Present Illness   Chief Complaint   Patient presents with    Flu Like Symptoms     Arrival: The patient arrived by [facility] Lajara is a 47 y.o. female who had concerns including Flu Like Symptoms.  47 year old female presents emergency room for evaluation of worsening cough, decreased sleep, coughing up blood, chest discomfort from cough worsening over the past 4 days.  Patient states she was seen and diagnosed with influenza and pneumonia on Sunday of this week.  Patient states she has not been eating well but has been drinking without difficulty.  She denies difficulty urinating.  She states the blood she is coughing up streaks in the phlegm.  She does note some soreness in her chest from coughing.  She states she has nebulizer at home but is out of the tubing and medications.  She was given an antibiotic as well as cough medication at home without improvement in her signs and symptoms.  Pain is currently 4/10.  She has no other associated symptoms at this time.    Review of Systems   All other systems reviewed and are negative except as noted.    Historical Data   History Reviewed This Encounter: Medical History  Surgical History  Family History  Social History    Physical Exam   ED Triage Vitals [07/30/22 0630]   BP (Non-Invasive) (!) 132/97   Heart Rate 81   Respiratory Rate 20   Temperature 37.1 C (98.7 F)   SpO2 97 %   Weight (!) 150 kg (330 lb)   Height 1.676 m ('5\' 6"'$ )       Constitutional:  47 y.o. female who appears in no distress. Normal color, no cyanosis.   HENT:   Head: Normocephalic and atraumatic.   Mouth/Throat: Oropharynx is clear and moist.   Ears: TM dull bilateral  Nose: no drainage.   Eyes: EOMI, PERRL   Neck: Trachea midline. Neck supple.  Cardiovascular: RRR, S1, S2 No murmurs, rubs or gallops. Intact distal pulses.  Pulmonary/Chest: BS course bilaterally.  Hacking cough on exam.  No  respiratory distress. No wheezes, rales or chest tenderness.   Abdominal: Bowel sounds present and normal. Abdomen soft, no tenderness, no rebound and no guarding.  Back: No midline spinal tenderness, no paraspinal tenderness, no CVA tenderness.           Musculoskeletal: No edema, tenderness or deformity.  Skin: warm and dry. No rash, erythema, pallor or cyanosis  Psychiatric: normal mood and affect. Behavior is normal.   Neurological: Patient keenly alert and responsive, easily able to raise eyebrows, facial muscles/expressions symmetric, speaking in fluent sentences, moving all extremities equally and fully, normal gait  Patient Data     Labs Ordered/Reviewed   COMPREHENSIVE METABOLIC PANEL, NON-FASTING - Abnormal; Notable for the following components:       Result Value    CALCIUM 8.5 (*)     All other components within normal limits    Narrative:     Estimated Glomerular Filtration Rate (eGFR) is calculated using the CKD-EPI (2021) equation, intended for patients 45 years of age and older. If gender is not documented or "unknown", there will be no eGFR calculation.     COVID-19, FLU A/B, RSV RAPID BY PCR - Abnormal; Notable for the following components:    INFLUENZA VIRUS TYPE A Detected (*)     All other components within normal  limits    Narrative:     Results are for the simultaneous qualitative identification of SARS-CoV-2 (formerly 2019-nCoV), Influenza A, Influenza B, and RSV RNA. These etiologic agents are generally detectable in nasopharyngeal and nasal swabs during the ACUTE PHASE of infection. Hence, this test is intended to be performed on respiratory specimens collected from individuals with signs and symptoms of upper respiratory tract infection who meet Centers for Disease Control and Prevention (CDC) clinical and/or epidemiological criteria for Coronavirus Disease 2019 (COVID-19) testing. CDC COVID-19 criteria for testing on human specimens is available at [facility] webpage information for Healthcare  Professionals: Coronavirus Disease 2019 (COVID-19) ([email or web address]).     False-negative results may occur if the virus has genomic mutations, insertions, deletions, or rearrangements or if performed very early in the course of illness. Otherwise, negative results indicate virus specific RNA targets are not detected, however negative results do not preclude SARS-CoV-2 infection/COVID-19, Influenza, or Respiratory syncytial virus infection. Results should not be used as the sole basis for patient management decisions. Negative results must be combined with clinical observations, patient history, and epidemiological information. If upper respiratory tract infection is still suspected based on exposure history together with other clinical findings, re-testing should be considered.    Disclaimer:   This assay has been authorized by FDA under an Emergency Use Authorization for use in laboratories certified under the Clinical Laboratory Improvement Amendments of 1988 (CLIA), 42 U.S.C. [phone], to perform high complexity tests. The impacts of vaccines, antiviral therapeutics, antibiotics, chemotherapeutic or immunosuppressant drugs have not been evaluated.     Test methodology:   Cepheid Xpert Xpress SARS-CoV-2/Flu/RSV Assay real-time polymerase chain reaction (RT-PCR) test on the GeneXpert Dx and Xpert Xpress systems.   D-DIMER - Abnormal; Notable for the following components:    D-DIMER 962 (*)     All other components within normal limits    Narrative:     D-Dimers are reported in FEU per ng/mL.    IF PATIENT IS EXHIBITING SYMPTOMS DVT/PE, THIS D-DIMER RESULT MAY INDICATE A NEED FOR FURTHER TESTING FOR THESE CONDITIONS. IF PATIENT IS SUSPECTED OF DIC AND SYMPTOMS WORSEN OR PERSIST, A REPEAT DIC WORKUP SHOULD BE CONSIDERED.    NOTE: ALTHOUGH THE NORMAL RANGE FOR THIS TEST IS 215-500 ng/mL FEU, LITERATURE RECOMMENDS FURTHER TESTING FOR ANY RESULT >500ng/mL FEU.    A cut off value of  500ng/mL FEU or below can be used as an aid in the diagnosis of Thromboembolism when used in conjunction with the patient's medical history, clinical presentation and other findings. Results of 500ng/mL FEU or below have a negative predictive value of 100%.     URINALYSIS, MICROSCOPIC - Abnormal; Notable for the following components:    MUCOUS Rare (*)     All other components within normal limits   B-TYPE NATRIURETIC PEPTIDE - Normal    Narrative:                                 Class 1: 101-250 pg/mL                              Class 2: 251-550 pg/mL                              Class 3: 551-900 pg/mL  Class 4: >901 pg/mL     The [place] [employer] Association has developed a four-stage functional classification system for CHF that is based on a subjective interpretation of the severity of a patient's clinical signs and symptoms.    Class 1 - Patients have no limitations on physical activity and have no symptoms with ordinary physical activity.    Class 2 - Patients have a slight limitation of physical activity and have symptoms with ordinary physical activity.    Class 3 - Patients have a marked limitation of physical activity and have symptoms with less than ordinary physical activity, but not at rest.    Class 4 - Patients are unable to perform any physical activity without discomfort.   LACTIC ACID LEVEL W/ REFLEX FOR LEVEL >2.0 - Normal   CBC WITH DIFF - Normal   URINALYSIS, MACROSCOPIC - Normal   CBC/DIFF    Narrative:     The following orders were created for panel order CBC/DIFF.  Procedure                               Abnormality         Status                     ---------                               -----------         ------                     CBC WITH WFUX[[id number]]                Normal              Final result                 Please view results for these tests on the individual orders.   URINALYSIS, MACROSCOPIC AND MICROSCOPIC W/CULTURE REFLEX    Narrative:     The  following orders were created for panel order URINALYSIS, MACROSCOPIC AND MICROSCOPIC W/CULTURE REFLEX.  Procedure                               Abnormality         Status                     ---------                               -----------         ------                     URINALYSIS, MACROSCOPIC[[id number]]      Normal              Final result               URINALYSIS, MICROSCOPIC[[id number]]      Abnormal            Final result                 Please view results for these tests on the individual orders.   EXTRA TUBES    Narrative:  The following orders were created for panel order EXTRA TUBES.  Procedure                               Abnormality         Status                     ---------                               -----------         ------                     GOLD TOP XTKW[[id number]]                                    In process                   Please view results for these tests on the individual orders.   GOLD TOP TUBE     CT ANGIO CHEST W IV CONTRAST   Final Result by Edi, Radresults In (01/11 0918)   1. NO PULMONARY EMBOLUS   2. THE LUNGS ARE CLEAR WITH NO ACUTE FINDINGS   3. MILDLY ENLARGED BILATERAL HILAR LYMPH NODES WITH A BORDERLINE ENLARGED RIGHT PRECARINAL LYMPH NODE         One or more dose reduction techniques were used (e.g., Automated exposure control, adjustment of the mA and/or kV according to patient size, use of iterative reconstruction technique).         Radiologist location ID: [id number]         XR AP MOBILE CHEST   Final Result by Edi, Radresults In (01/11 0717)   NEGATIVE CHEST         Radiologist location ID: [place]        Medical Decision Making  Will discharge patient home due to no significant/urgent findings requiring admission.  Vital signs are stable and oxygen saturations are 97% on room air.  Patient recently had influenza, she is still testing positive at this time.  Upon examination she did have coarse breath sounds;  however, after receiving nebulizers and steroids she did improve.  Patient was discharged upon initial diagnosis with cough medication which did not help with signs and symptoms.  Patient does have a nebulizer at home but does not have the medication or supplies.  She was instructed to increase her fluid intake, we will provide her with Zofran at home as well as nebulizers for p.r.n. use of shortness a breath/wheezing.  Due to the patient's history of smoking and lung sounds will add on steroids to her antibiotic and cough medication.  Labs show no significant findings, kidney function is normal, white blood cell count is normal.  For plexus showing influenza positive.  CT angio shows no PEs.  Patient was instructed to follow up with her primary care provider in 2-3 days for recheck.  Patient may return to the ER for any problems or worsening of symptoms patient did verbalize understanding    Amount and/or Complexity of Data Reviewed  Labs: ordered. Decision-making details documented in ED Course.  Radiology: ordered. Decision-making details documented in ED Course.  ECG/medicine  tests: ordered. Decision-making details documented in ED Course.     Details: EKG does show 70 beats per minute, no ST elevation or bundle-branch blocks noted.    Risk  OTC drugs.  Prescription drug management.    Critical Care  Total time providing critical care: 0 minutes      Results for orders placed or performed during the hospital encounter of 07/30/22 (from the past 12 hour(s))   COVID-19, FLU A/B, RSV RAPID BY PCR   Result Value Ref Range    SARS-CoV-2 Not Detected Not Detected    INFLUENZA VIRUS TYPE A Detected (A) Not Detected    INFLUENZA VIRUS TYPE B Not Detected Not Detected    RESPIRATORY SYNCTIAL VIRUS (RSV) Not Detected Not Detected   B-TYPE NATRIURETIC PEPTIDE   Result Value Ref Range    BNP 72 5 - 100 pg/mL   COMPREHENSIVE METABOLIC PANEL, NON-FASTING   Result Value Ref Range    SODIUM 138 136 - 145 mmol/L    POTASSIUM 4.1  3.5 - 5.1 mmol/L    CHLORIDE 105 98 - 107 mmol/L    CO2 TOTAL 28 21 - 31 mmol/L    ANION GAP 5 4 - 13 mmol/L    BUN 12 7 - 25 mg/dL    CREATININE 0.84 0.60 - 1.30 mg/dL    BUN/CREA RATIO 14 6 - 22    ESTIMATED GFR 88 >59 mL/min/1.23m2    ALBUMIN 3.5 3.5 - 5.7 g/dL    CALCIUM 8.5 (L) 8.6 - 10.3 mg/dL    GLUCOSE 85 74 - 109 mg/dL    ALKALINE PHOSPHATASE 70 34 - 104 U/L    ALT (SGPT) 21 7 - 52 U/L    AST (SGOT) 19 13 - 39 U/L    BILIRUBIN TOTAL 0.3 0.3 - 1.2 mg/dL    PROTEIN TOTAL 7.2 6.4 - 8.9 g/dL    ALBUMIN/GLOBULIN RATIO 0.9 0.8 - 1.4    OSMOLALITY, CALCULATED 275 270 - 290 mOsm/kg    CALCIUM, CORRECTED 8.9 8.9 - 10.8 mg/dL    GLOBULIN 3.7 2.9 - 5.4   LACTIC ACID LEVEL W/ REFLEX FOR LEVEL >2.0   Result Value Ref Range    LACTIC ACID 0.5 0.5 - 2.2 mmol/L   D-DIMER   Result Value Ref Range    D-DIMER 962 (HH) 215 - 500 ng/mL FEU   CBC WITH DIFF   Result Value Ref Range    WBC 6.4 3.8 - 11.8 x10^3/uL    RBC 4.46 3.63 - 4.92 x10^6/uL    HGB 12.7 10.9 - 14.3 g/dL    HCT 38.5 31.2 - 41.9 %    MCV 86.3 75.5 - 95.3 fL    MCH 28.4 24.7 - 32.8 pg    MCHC 33.0 32.3 - 35.6 g/dL    RDW 13.3 12.3 - 17.7 %    PLATELETS 240 140 - 440 x10^3/uL    MPV 9.1 7.9 - 10.8 fL    NEUTROPHIL % 66 43 - 77 %    LYMPHOCYTE % 18 16 - 44 %    MONOCYTE % 13 5 - 13 %    EOSINOPHIL % 2 %    BASOPHIL % 1 0 - 1 %    NEUTROPHIL # 4.30 1.85 - 7.80 x10^3/uL    LYMPHOCYTE # 1.10 1.00 - 3.00 x10^3/uL    MONOCYTE # 0.90 0.30 - 1.00 x10^3/uL    EOSINOPHIL # 0.10 0.00 - 0.50 x10^3/uL    BASOPHIL # 0.00 0.00 -  0.10 x10^3/uL   URINALYSIS, MACROSCOPIC   Result Value Ref Range    COLOR Light Yellow Colorless, Light Yellow, Yellow    APPEARANCE Clear Clear    SPECIFIC GRAVITY 1.022 1.002 - 1.030    PH 7.0 5.0 - 9.0    LEUKOCYTES Negative Negative, 100  WBCs/uL    NITRITE Negative Negative    PROTEIN Negative Negative, 10 , 20  mg/dL    GLUCOSE Negative Negative, 30  mg/dL    KETONES Negative Negative, Trace mg/dL    BILIRUBIN Negative Negative, 0.5 mg/dL    BLOOD  Negative Negative, 0.03 mg/dL    UROBILINOGEN Normal Normal mg/dL   URINALYSIS, MICROSCOPIC   Result Value Ref Range    MUCOUS Rare (A) (none) /hpf    RBCS 2 <4 /hpf    WBCS 1 <6 /hpf    SQUAMOUS EPITHELIAL 6 <28 /hpf      ED Course as of 07/30/22 1005   Thu Jul 30, 2022   0851 Obtained 20 g to left forearm without difficulty. Flushed easily. Able to withdraw blood from heplock.          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  Qty: 21 Tablet, Refills: 0      ondansetron (ZOFRAN ODT) 4 mg Oral Tablet, Rapid Dissolve Take 1 Tablet (4 mg total) by mouth Every 8 hours as needed for Nausea/Vomiting  Qty: 12 Tablet, Refills: 0          Duoneb

## 2022-07-30 NOTE — ED Nurses Note (Signed)
UNABLE TO OBTAIN IV ACCESS. SOLUMEDROL GIVEN IM, PROVIDER MADE AWARE.

## 2022-07-30 NOTE — Discharge Instructions (Signed)
FOLLOW UP WITH THE PRIMARY CARE PROVIDER AS SOON AS POSSIBLE BUT NO LATER THAN 3 DAYS NOW.  IF NO PRIMARY CARE PROVIDER EXISTS, THEN THE PATIENT IS INSTRUCTED TO ESTABLISH CARE WITH A PRIMARY CARE PROVIDER. FOLLOW-UP WITH ANY SPECIALIST PROVIDER AS INDICATED AS SOON AS POSSIBLE BUT NO LATER THAN 3 DAYS.  NOTIFY THE PRIMARY CARE PROVIDER THAT YOU WERE IN THE EMERGENCY DEPARTMENT WITHIN 24 HOURS OF DISCHARGE TO FOLLOW-UP ON YOUR RESULTS AND/OR TREATMENTS.  RETURN TO THE EMERGENCY DEPARTMENT IMMEDIATELY IF NEEDED, NO BETTER, WORSE, NEW SYMPTOMS ARISE, OR YOU CANNOT FOLLOW-UP WITH YOUR PRIMARY CARE PROVIDER IN THE PRESCRIBED TIMEFRAME.

## 2022-07-30 NOTE — ED Nurses Note (Signed)
D/C'D TO HOME, NO ACUTE DISTRESS NOTED. VOICED UNDERSTANDING OF D/C INSTRUCTIONS.

## 2022-07-30 NOTE — ED Triage Notes (Signed)
Dx with flu and pneumonia on Sunday, reports cough has gotten worse, "I'm starting to cough up blood"

## 2022-07-31 DIAGNOSIS — R059 Cough, unspecified: Secondary | ICD-10-CM

## 2022-07-31 DIAGNOSIS — R0789 Other chest pain: Secondary | ICD-10-CM

## 2022-07-31 LAB — ECG 12 LEAD
Atrial Rate: 70 {beats}/min
Calculated P Axis: 58 degrees
Calculated R Axis: 2 degrees
Calculated T Axis: 14 degrees
PR Interval: 156 ms
QRS Duration: 104 ms
QT Interval: 412 ms
QTC Calculation: 444 ms
Ventricular rate: 70 {beats}/min

## 2022-08-10 ENCOUNTER — Other Ambulatory Visit: Payer: Self-pay

## 2022-08-10 ENCOUNTER — Ambulatory Visit (HOSPITAL_COMMUNITY)
Admission: RE | Admit: 2022-08-10 | Discharge: 2022-08-10 | Disposition: A | Discharge status: Still In Hospital | Payer: Medicaid Other | Source: Ambulatory Visit | Attending: NURSE PRACTITIONER | Admitting: NURSE PRACTITIONER

## 2022-08-10 NOTE — PT Treatment (Signed)
[facility]  Outpatient Physical Therapy  [place], [id number]  [phone]  [phone]    Physical Therapy Treatment Note    Date: 08/10/2022  Patient's Name: Johni Narine  Date of Birth: April 26, 1976            Visit #/POC: 2 of 39  Authorization: 48 CY  POC Signed?: Yes  POC Ends: 10/07/22  Next Progress Note Due: 8-10 visits      Evaluating Physical Therapist: Jonell Cluck, DPT  PT diagnosis/Reason for Referral: L shoulder pain  Next Scheduled Physician Appointment: TBD  Allergies/Contraindications:           Subjective: Pt reports that she had flu and pneumonia so she has not been able to make it to therapy. She notes that she has been wearing her sling however it was hurting too bad so she took it off. She notes that yesterday her arm was hurting so bad the pain was going up into her face and they let her go home early from work. Today she rates her pain at 5/10.    Objective: Began treatment with [place] to L shoulder followed by pre mod e-stim to L arm and posterior shoulder. Ended with trial of kinesiotape for correction of forward humeral head and support.    Measured ROM: no measurements taken today  EXERCISE/ACTIVITY NAME REPETITIONS RESISTANCE COMPLETED THIS DOS   [place]   8 minutes  Y   Pre mod e-stim   12 minutes  Y   Kinesiotaping for forward humeral head and support     Y                                                         Assessment: Pt has not been to PT in 2 weeks due to having flu and pneumonia. Treatment today focused on pain control which was tolerated well with no adverse effects. She was given instruction on wear and removal of kinesiotape.   Short term goals (4 weeks):               1. Patient to improve L shoulder ROM (flexion/abduction) to 120 degrees with pain rated <4/10               2. Patient to improve L grip strength >40#                3. Patient to have centralization of radicular symptoms noted by no radicular  symptoms present in patients thumb and index fingers.               4. Patient to improve functional IR/ER to C3/L3 to improve ADL's  5. Resting pain level and proximal stability WFL to permit normal posturing of affected UE at rest.            Long term goals (10 weeks):               1. Patient to return to full occupational demands/ work duty with no restriction                2. Patient to be able return to ADL's (putting hair up, getting bra on) without needing assistance from boyfriend/coworkers.  3. Patient to be able to perform personal hygiene not limited by L shoulder ROM.               4. Patient to improve proximal stability of L shoulder girdle tolerating WB through L UE    Plan: Continue with pain control and assess cervical involvement    Total Session Time 25 and Timed code minutes 8   ULTRASOUND 107mnutes and ELECTRICAL STIMULATION 12 minutes Kinesiotaping 5 minutes      Catcher Dehoyos, PTA  08/10/2022, 12:53

## 2022-08-12 ENCOUNTER — Ambulatory Visit (HOSPITAL_COMMUNITY)
Admission: RE | Admit: 2022-08-12 | Discharge: 2022-08-12 | Disposition: A | Discharge status: Still In Hospital | Payer: Medicaid Other | Source: Ambulatory Visit | Attending: NURSE PRACTITIONER | Admitting: NURSE PRACTITIONER

## 2022-08-12 NOTE — PT Treatment (Signed)
[facility]  Outpatient Physical Therapy  [place], [address]  [phone]  [phone]    Physical Therapy Treatment Note    Date: 08/12/2022  Patient's Name: Caitlyn Gomez  Date of Birth: [birth year]      Visit #/POC: 3 of 6  Authorization: 82 CY  POC Signed?: Yes  POC Ends: 10/07/22  Next Progress Note Due: 08/22/22        Evaluating Physical Therapist: Jonell Cluck, DPT  PT diagnosis/Reason for Referral: L shoulder pain  Next Scheduled Physician Appointment: TBD  Allergies/Contraindications:           Subjective: Patient notes that she felt good after that last treatment session. Felt as if the tape helped support her arm in a way that was very comforting. Pain rated as 4/10 today. Notes last night was really rough after removing the tape, her shoulder was very irritated. Pt notes having cramping in B hands yesterday where she has to put pressure through it to decrease cramping. Unsure what causes this but doctors think it could be related to the cervical myelopathy.        Objective: Began treatment with [place] to L shoulder followed by IFC e-stim to L arm and posterior shoulder. Ended with kinesiotape for correction of forward humeral head and support.     Circumference (midline of humerus) 08/12/22   Right  45 cm   Left 52 cm       EXERCISE/ACTIVITY NAME REPETITIONS RESISTANCE COMPLETED THIS DOS   [place]    8 minutes   Y   IFC x e-stim    10 minutes   Y   Kinesiotaping for forward humeral head and support        Y    Tendon glides for hands        Yes + HEP 08/12/22                                                                     Access Code: [email or web address]  URL: [email or web address]  Date: 07/22/2022  Prepared by: Jonell Cluck     Exercises  - Circular Shoulder Pendulum with Table Support  - 1 x daily - 7 x weekly  - Horizontal Shoulder Pendulum with Table Support  - 1 x daily - 7 x weekly  - Flexion-Extension Shoulder Pendulum with Table  Support  - 1 x daily - 7 x weekly  - Seated Shoulder Abduction Towel Slide at Table Top  - 1 x daily - 7 x weekly - 1-2 sets - 10 reps  - Seated Shoulder Flexion Towel Slide at Table Top  - 1 x daily - 7 x weekly - 1-2 sets - 10 reps  - Seated Scapular Retraction  - 1 x daily - 7 x weekly - 1-2 sets - 10 reps  - Tendon glides for hands (printout in chart)        Assessment: Treatment focused pain control on this date due to high irritability. Pt noted to have swelling in L humeral region which was measured and documented above. Pt is going to follow up with PCP about compression to ensure nothing contraindicates use of light compression sleeve. K-tape placed at conclusion of session.  Reviewed tendon glides for B hands with patient with printout provided.      Short term goals (4 weeks):               1. Patient to improve L shoulder ROM (flexion/abduction) to 120 degrees with pain rated <4/10               2. Patient to improve L grip strength >40#                3. Patient to have centralization of radicular symptoms noted by no radicular symptoms present in patients thumb and index fingers.               4. Patient to improve functional IR/ER to C3/L3 to improve ADL's  5. Resting pain level and proximal stability WFL to permit normal posturing of affected UE at rest.            Long term goals (10 weeks):               1. Patient to return to full occupational demands/ work duty with no restriction                2. Patient to be able return to ADL's (putting hair up, getting bra on) without needing assistance from boyfriend/coworkers.               3. Patient to be able to perform personal hygiene not limited by L shoulder ROM.               4. Patient to improve proximal stability of L shoulder girdle tolerating WB through L UE     Plan: Continue with modalities for pain control, progressing supine AAROM, and assess if PCP cleared pt for use of light compression sleeve.     Total Session Time 40, Timed code  minutes 15, and Untimed code minutes 20  THERAPEUTIC EXERCISE 15 minutes,  ULTRASOUND 46mnutes, and ELECTRICAL STIMULATION 10 minutes      CJonell Cluck PT  08/12/2022, 09:11

## 2022-08-17 ENCOUNTER — Other Ambulatory Visit: Payer: Self-pay

## 2022-08-17 ENCOUNTER — Ambulatory Visit (HOSPITAL_COMMUNITY)
Admission: RE | Admit: 2022-08-17 | Discharge: 2022-08-17 | Disposition: A | Discharge status: Still In Hospital | Payer: Medicaid Other | Source: Ambulatory Visit | Attending: NURSE PRACTITIONER | Admitting: NURSE PRACTITIONER

## 2022-08-17 NOTE — PT Treatment (Signed)
[facility]  Outpatient Physical Therapy  [place], [address]  [phone]  [phone]    Physical Therapy Treatment Note    Date: 08/17/2022  Patient's Name: Caitlyn Gomez  Date of Birth: 1975-09-19      Visit #/POC: 4 of 72  Authorization: 80 CY  POC Signed?: Yes  POC Ends: 10/07/22  Next Progress Note Due: 08/22/22        Evaluating Physical Therapist: Jonell Cluck, DPT  PT diagnosis/Reason for Referral: L shoulder pain  Next Scheduled Physician Appointment: TBD  Allergies/Contraindications:           Subjective: Has been hurting all night, did not sleep well at all last night. Hurts in the bicep area and in the back of the shoulder, rates the pain at an 7-8/10. States that she called the Dr. About the compression sleeve and per pt she has an appt with her later this week and they will discuss it then.      Objective: Began treatment with [place] to L shoulder, MFR with rock blade to L bicep area,  followed by IFC e-stim to L arm and posterior shoulder. Ended with kinesiotape 1 strip inhibitory bicep, 1 strip over supraspinatus.     Circumference (midline of humerus) 08/12/22   Right  45 cm   Left 52 cm       EXERCISE/ACTIVITY NAME REPETITIONS RESISTANCE COMPLETED THIS DOS   [place]    8 minutes   Y   IFC x e-stim    10 minutes   Y   Kinesiotaping for forward humeral head and support        Y    Tendon glides for hands        N+ HEP 08/12/22    MFR with rock blade- bicep/RC         Y                                                           Assessment: Pt was tearful this session due to pain and lack of sleep. Fair tolerance for the MFR with blade due to tenderness over RC and bicep muscle belly. Trial of kinesiotape to bicep and supraspinatus today due to the increased irritability of those areas.      Short term goals (4 weeks):               1. Patient to improve L shoulder ROM (flexion/abduction) to 120 degrees with pain rated <4/10               2.  Patient to improve L grip strength >40#                3. Patient to have centralization of radicular symptoms noted by no radicular symptoms present in patients thumb and index fingers.               4. Patient to improve functional IR/ER to C3/L3 to improve ADL's  5. Resting pain level and proximal stability WFL to permit normal posturing of affected UE at rest.            Long term goals (10 weeks):               1. Patient to  return to full occupational demands/ work duty with no restriction                2. Patient to be able return to ADL's (putting hair up, getting bra on) without needing assistance from boyfriend/coworkers.               3. Patient to be able to perform personal hygiene not limited by L shoulder ROM.               4. Patient to improve proximal stability of L shoulder girdle tolerating WB through L UE     Plan: Continue with modalities for pain control, progressing supine AAROM.    Total Session Time 30, Timed code minutes 20, and Untimed code minutes 10  THERAPEUTIC EXERCISE 0 minutes,  ULTRASOUND 48mnutes, and ELECTRICAL STIMULATION 10 minutes Manual Therapy 10 minutes    Larya Charpentier, PTA 08/17/2022 14:17

## 2022-08-19 ENCOUNTER — Ambulatory Visit
Admission: RE | Admit: 2022-08-19 | Discharge: 2022-08-19 | Disposition: A | Discharge status: Still In Hospital | Payer: Medicaid Other | Source: Ambulatory Visit | Attending: NURSE PRACTITIONER | Admitting: NURSE PRACTITIONER
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
## 2022-08-24 ENCOUNTER — Other Ambulatory Visit: Payer: Self-pay

## 2022-08-24 ENCOUNTER — Ambulatory Visit (HOSPITAL_COMMUNITY)
Admission: RE | Admit: 2022-08-24 | Discharge: 2022-08-24 | Disposition: A | Discharge status: Still In Hospital | Payer: Medicaid Other | Source: Ambulatory Visit | Attending: NURSE PRACTITIONER | Admitting: NURSE PRACTITIONER

## 2022-08-24 DIAGNOSIS — M25512 Pain in left shoulder: Secondary | ICD-10-CM | POA: Insufficient documentation

## 2022-08-24 DIAGNOSIS — M25562 Pain in left knee: Secondary | ICD-10-CM | POA: Insufficient documentation

## 2022-08-24 NOTE — PT Treatment (Signed)
[facility]  Outpatient Physical Therapy  [place], [address]  [phone]  [phone]    Physical Therapy Treatment Note    Date: 08/24/2022  Patient's Name: Caitlyn Gomez  Date of Birth: [birth year]      Visit #/POC: 6 of 58  Authorization: 6/ 20 CY  POC Signed?: Yes  POC Ends: 10/07/22  Next Progress Note Due: 2/31/24        Evaluating Physical Therapist: Jonell Cluck, DPT  PT diagnosis/Reason for Referral: L shoulder pain  Next Scheduled Physician Appointment: TBD  Allergies/Contraindications:            Subjective: Pt states that she was sent home for work on Saturday because her pain in her arm was so bad it was going up into her face. She notes that today it is up into the arm pit, she is having so much pain. She notes that the family doctor ok'd the compression stocking and they will give her an order for an MRI in 2 weeks if she does not get any better.    Objective: There ex per flow sheet for L shoulder ROM and gentle strengthening. Was given tensoshape (compression garment).     Circumference (midline of humerus) 08/12/22 08/19/22   Right  45 cm    Left 52 cm 51 cm      Patient-Specific Functional Score:  Problem 07/22/22 08/19/22   1. Work without restriction 2 2   2. Personal care (Hair brushing/makeup) 0 0   3. Personal hygiene 0 0   TOTAL .667 .667      OBJECTIVE     Shoulder ROM     (PROM= Supine) 07/22/22 PROM/AAROM (Supine) AROM (Sitting)   Flexion 90 125 degrees /130 degrees 100 degrees   Abduction 90 143 Degrees/ 132 degrees N/T   IR 8  L4    ROM comment: End feel for PROM flexion and abduction was empty (pain limiting)     Strength    Left 08/19/22   Flexion 2+   Abduction 2+     Grip strength:    07/22/22 08/19/22   Right 30#  55#   Left 25# (tremor ing in L hand) 25# (less shakiness)      EXERCISE/ACTIVITY NAME REPETITIONS RESISTANCE COMPLETED THIS DOS   Circular Shoulder Pendulum with Table Support 1-2 mins P/AAROM No + HEP 07/22/22    Horizontal Shoulder Pendulum with Table Support  1-2 mins P/AAROM No + HEP 07/22/22   Flexion-Extension Shoulder Pendulum with Table Support 1-2 mins P/AAROM No + HEP 07/22/22   Seated Shoulder Abduction Towel Slide at Table Top 2x10 P/AAROM No + HEP 07/22/22   Seated Shoulder Flexion Towel Slide at Table Top 2x10 P/AAROM No + HEP 07/22/22   Seated Scapular Retraction   2x10 P/AAROM No + HEP 07/22/22   [place]    8 minutes   N   IFC x e-stim    10 minutes   Yes   Kinesiotaping for forward humeral head and support        Yes    Tendon glides for hands       (hard copy in chart) N + HEP 08/12/22    MFR with rock blade- bicep/RC         N    Supine Shoulder Flexion Extension AAROM with Dowel   x10  Discussed supine vs reclined  Yes + HEP 08/18/22     Supine Shoulder Abduction  AAROM with Dowel  x10  Discussed supine vs reclined  N + HEP 08/18/22     Supine Shoulder Horizontal Abduction Adduction AAROM with Dowel   x10  Discussed supine vs reclined  N + HEP 08/18/22     Supine Shoulder External Rotation AAROM with Dowel   x10  Discussed supine vs reclined  Yes + HEP 08/18/22    Supine Shoulder External Rotation with Dowel  - x10 Discussed supine vs reclined Yes + HEP 08/18/22   Seated Shoulder Shrugs    x10 Discussed supine vs reclined N + HEP 08/18/22   Standing Scapular Depression x10 Discussed supine vs reclined N + HEP 08/18/22   Supine chest press with wand x10  Y   4 way isometrics  x5 each  Y   Access Code: [email or web address]  URL: [email or web address]  Date: 08/19/2022  Prepared by: Jonell Cluck    Exercises  - Supine Shoulder Flexion Extension AAROM with Dowel  - 1-3 x daily - 7 x weekly - 10-15 reps  - Supine Shoulder Abduction AAROM with Dowel  - 1-3 x daily - 7 x weekly - 10-15 reps  - Supine Shoulder Horizontal Abduction Adduction AAROM with Dowel  - 1-3 x daily - 7 x weekly - 10-15 reps  - Supine Shoulder External Rotation AAROM with Dowel  - 1-3 x daily - 7 x weekly - 10-15 reps  - Supine Shoulder External Rotation with Dowel   - 1-3 x daily - 7 x weekly - 10-15 reps  - Seated Scapular Retraction  - 1-3 x daily - 7 x weekly - 10-15 reps  - Seated Shoulder Shrugs  - 1-3 x daily - 7 x weekly - 10-15 reps  - Standing Scapular Depression  - 1-3 x daily - 7 x weekly - 10-15 reps     Assessment: Pt has improved ROM of L shoulder since [facility] and today she tolerated isometric exercises fair. She was very tearful at end of session.     Short term goals (4 weeks):               1. Patient to improve L shoulder AROM (flexion/abduction) to 120 degrees with pain rated <4/10 (PROGRESSING 08/19/22)               2. Patient to improve L grip strength >40#  (NOT MET 08/19/22)               3. Patient to have centralization of radicular symptoms noted by no radicular symptoms present in patients thumb and index fingers. (NOT MET 08/19/22)               4. Patient to improve functional IR/ER to C3/L3 to improve ADL's (PROGRESSING 08/19/22)  5. Resting pain level and proximal stability WFL to permit normal posturing of affected UE at rest. (PROGRESSING 08/19/22)           Long term goals (10 weeks):               1. Patient to return to full occupational demands/ work duty with no restriction (NOT MET 08/19/22)               2. Patient to be able return to ADL's (putting hair up, getting bra on) without needing assistance from boyfriend/coworkers. (NOT MET 08/19/22)               3. Patient to be able to perform personal hygiene not limited by L shoulder ROM. (  NOT MET 08/19/22)               4. Patient to improve proximal stability of L shoulder girdle tolerating WB through L UE (NOT MET 08/19/22)     Plan: Continue with conservative treatment progressing AAROM to more gravity exposure and AROM as tolerated. Initiate isometrics next session as assess tolerance. Assess if MD cleared compression sleeve and if patient has been weaning from sling.       Total Session Time 35 and Timed code minutes 35  THERAPEUTIC EXERCISE 35 minutes      Caitlyn Gomez, PTA 08/24/2022  14:08

## 2022-08-26 ENCOUNTER — Other Ambulatory Visit: Payer: Self-pay

## 2022-08-26 ENCOUNTER — Ambulatory Visit (HOSPITAL_COMMUNITY)
Admission: RE | Admit: 2022-08-26 | Discharge: 2022-08-26 | Disposition: A | Discharge status: Still In Hospital | Payer: Medicaid Other | Source: Ambulatory Visit | Attending: NURSE PRACTITIONER | Admitting: NURSE PRACTITIONER

## 2022-08-26 NOTE — PT Treatment (Signed)
[facility]  Outpatient Physical Therapy  [place], [address]  [phone]  [phone]    Physical Therapy Treatment Note    Date: 08/26/2022  Patient's Name: Caitlyn Gomez  Date of Birth: 12-07-1975      Visit #/POC: 7 of 101  Authorization: 7 of 29 CY  POC Signed?: Yes  POC Ends: 10/07/22  Next Progress Note Due: 2/31/24        Evaluating Physical Therapist: Jonell Cluck, DPT  PT diagnosis/Reason for Referral: L shoulder pain  Next Scheduled Physician Appointment: TBD  Allergies/Contraindications:            Subjective: Has already taken a couple of Tylenol today to help with the pain. Today is a better day as far as pain goes.    Objective: Warm up on Pulleys followed by there ex per flow sheet below for ROM and scapular strengthening.     Circumference (midline of humerus) 08/12/22 08/19/22   Right  45 cm     Left 52 cm 51 cm      Patient-Specific Functional Score:  Problem 07/22/22 08/19/22   1. Work without restriction 2 2   2. Personal care (Hair brushing/makeup) 0 0   3. Personal hygiene 0 0   TOTAL .667 .667      OBJECTIVE     Shoulder ROM     (PROM= Supine) 07/22/22 PROM/AAROM (Supine) AROM (Sitting)   Flexion 90 125 degrees /130 degrees 100 degrees   Abduction 90 143 Degrees/ 132 degrees N/T   IR 8  L4    ROM comment: End feel for PROM flexion and abduction was empty (pain limiting)     Strength    Left 08/19/22   Flexion 2+   Abduction 2+     Grip strength:    07/22/22 08/19/22   Right 30#  55#   Left 25# (tremor ing in L hand) 25# (less shakiness)      EXERCISE/ACTIVITY NAME REPETITIONS RESISTANCE COMPLETED THIS DOS   Circular Shoulder Pendulum with Table Support 1-2 mins P/AAROM No + HEP 07/22/22   Horizontal Shoulder Pendulum with Table Support  1-2 mins P/AAROM No + HEP 07/22/22   Flexion-Extension Shoulder Pendulum with Table Support 1-2 mins P/AAROM No + HEP 07/22/22   Seated Shoulder Abduction Towel Slide at Table Top 2x10 P/AAROM No + HEP 07/22/22    Seated Shoulder Flexion Towel Slide at Table Top 2x10 P/AAROM No + HEP 07/22/22   Seated Scapular Retraction   2x10 P/AAROM No + HEP 07/22/22   [place]    8 minutes   N   IFC x e-stim    10 minutes   Yes   Kinesiotaping for forward humeral head and support        Yes    Tendon glides for hands       (hard copy in chart) N + HEP 08/12/22    MFR with rock blade- bicep/RC         N    Supine Shoulder Flexion Extension AAROM with Dowel   x10  Discussed supine vs reclined  Yes + HEP 08/18/22     Supine Shoulder Abduction AAROM with Dowel  x10  Discussed supine vs reclined  N + HEP 08/18/22     Supine Shoulder Horizontal Abduction Adduction AAROM with Dowel   x10  Discussed supine vs reclined  N + HEP 08/18/22     Supine Shoulder External Rotation AAROM  with Dowel   x10  Discussed supine vs reclined  Yes + HEP 08/18/22    Supine Shoulder External Rotation with Dowel  - x10 Discussed supine vs reclined Yes + HEP 08/18/22   Seated Shoulder Shrugs    x10 Discussed supine vs reclined N + HEP 08/18/22   Standing Scapular Depression x10 Discussed supine vs reclined N + HEP 08/18/22   Supine chest press with wand x10  Y   4 way isometrics  x5 each  Y   Scapular retractions with t-band 10 red Y   Stair handrail wipe 10  Y   UBE 5 minutes  Y   Access Code: [email or web address]  URL: [email or web address]  Date: 08/19/2022  Prepared by: Jonell Cluck    Exercises  - Supine Shoulder Flexion Extension AAROM with Dowel  - 1-3 x daily - 7 x weekly - 10-15 reps  - Supine Shoulder Abduction AAROM with Dowel  - 1-3 x daily - 7 x weekly - 10-15 reps  - Supine Shoulder Horizontal Abduction Adduction AAROM with Dowel  - 1-3 x daily - 7 x weekly - 10-15 reps  - Supine Shoulder External Rotation AAROM with Dowel  - 1-3 x daily - 7 x weekly - 10-15 reps  - Supine Shoulder External Rotation with Dowel  - 1-3 x daily - 7 x weekly - 10-15 reps  - Seated Scapular Retraction  - 1-3 x daily - 7 x weekly - 10-15 reps  - Seated Shoulder Shrugs  - 1-3 x daily - 7 x weekly  - 10-15 reps  - Standing Scapular Depression  - 1-3 x daily - 7 x weekly - 10-15 reps     Assessment:Pt tolerated ROM exercises much better today with less pain. She has improved ROM, still tends to keep arm tucked in to her side with no arm swing during gait.     Short term goals (4 weeks):               1. Patient to improve L shoulder AROM (flexion/abduction) to 120 degrees with pain rated <4/10 (PROGRESSING 08/19/22)               2. Patient to improve L grip strength >40#  (NOT MET 08/19/22)               3. Patient to have centralization of radicular symptoms noted by no radicular symptoms present in patients thumb and index fingers. (NOT MET 08/19/22)               4. Patient to improve functional IR/ER to C3/L3 to improve ADL's (PROGRESSING 08/19/22)  5. Resting pain level and proximal stability WFL to permit normal posturing of affected UE at rest. (PROGRESSING 08/19/22)           Long term goals (10 weeks):               1. Patient to return to full occupational demands/ work duty with no restriction (NOT MET 08/19/22)               2. Patient to be able return to ADL's (putting hair up, getting bra on) without needing assistance from boyfriend/coworkers. (NOT MET 08/19/22)               3. Patient to be able to perform personal hygiene not limited by L shoulder ROM. (NOT MET 08/19/22)               4. Patient to  improve proximal stability of L shoulder girdle tolerating WB through L UE (NOT MET 08/19/22)     Plan: Continue with conservative treatment progressing AAROM to more gravity exposure and AROM as tolerated.   Total Session Time 35 and Timed code minutes 35  THERAPEUTIC EXERCISE 35 minutes      Diamond Martucci, PTA 08/26/2022 13:26

## 2022-08-28 ENCOUNTER — Ambulatory Visit (HOSPITAL_COMMUNITY): Payer: Self-pay

## 2022-08-31 ENCOUNTER — Other Ambulatory Visit: Payer: Self-pay

## 2022-08-31 ENCOUNTER — Ambulatory Visit (HOSPITAL_COMMUNITY)
Admission: RE | Admit: 2022-08-31 | Discharge: 2022-08-31 | Disposition: A | Discharge status: Still In Hospital | Payer: Medicaid Other | Source: Ambulatory Visit | Attending: NURSE PRACTITIONER | Admitting: NURSE PRACTITIONER

## 2022-08-31 NOTE — PT Treatment (Signed)
[facility]  Outpatient Physical Therapy  [place], [address]  [phone]  [phone]    Physical Therapy Treatment Note    Date: 08/31/2022  Patient's Name: Caitlyn Gomez  Date of Birth: Dec 31, 1975      Visit #/POC: 47 of 67  Authorization: 63 of [id number] CY  POC Signed?: Yes  POC Ends: 10/07/22  Next Progress Note Due: 2/31/24        Evaluating Physical Therapist: Jonell Cluck, DPT  PT diagnosis/Reason for Referral: L shoulder pain  Next Scheduled Physician Appointment: 09/10/21  Allergies/Contraindications:            Subjective: Reports that her shoulder pain this morning is about a 4/10. States that most of the pain is in the bicep, arm pit and in the posterior shoulder. Notes that she is increasing activity at work but still within her restrictions. She also notes that she can put her own hair up and do personal hygiene without assistance from her boyfriend.    Objective: Warm up on UBE followed by there ex per flow sheet below for ROM and scapular strengthening.     Circumference (midline of humerus) 08/12/22 08/19/22   Right  45 cm     Left 52 cm 51 cm      Patient-Specific Functional Score:  Problem 07/22/22 08/19/22 08/31/22   1. Work without restriction 2 2 4   $ 2. Personal care (Hair brushing/makeup) 0 0 6   3. Personal hygiene 0 0 9   TOTAL .667 .667 6.33      OBJECTIVE     Shoulder ROM     (PROM= Supine) 07/22/22 PROM/AAROM (Supine) AROM (Sitting) AROM 08/31/22   Flexion 90 125 degrees /130 degrees 100 degrees 115   Abduction 90 143 Degrees/ 132 degrees N/T 100   IR 8  L4 L4    ROM comment: End feel for PROM flexion and abduction was empty (pain limiting)     Strength    Left 08/19/22   Flexion 2+   Abduction 2+     Grip strength:    07/22/22 08/19/22 08/31/22   Right 30#  55#    Left 25# (tremor ing in L hand) 25# (less shakiness) 30#      EXERCISE/ACTIVITY NAME REPETITIONS RESISTANCE COMPLETED THIS DOS   Circular Shoulder Pendulum with Table Support 1-2  mins P/AAROM No + HEP 07/22/22   Horizontal Shoulder Pendulum with Table Support  1-2 mins P/AAROM No + HEP 07/22/22   Flexion-Extension Shoulder Pendulum with Table Support 1-2 mins P/AAROM No + HEP 07/22/22   Seated Shoulder Abduction Towel Slide at Table Top 2x10 P/AAROM No + HEP 07/22/22   Seated Shoulder Flexion Towel Slide at Table Top 2x10 P/AAROM No + HEP 07/22/22   Seated Scapular Retraction   2x10 P/AAROM No + HEP 07/22/22   [place]    8 minutes   N   IFC x e-stim    10 minutes   Yes   Kinesiotaping for forward humeral head and support        Yes    Tendon glides for hands       (hard copy in chart) N + HEP 08/12/22    MFR with rock blade- bicep/RC         N    Supine Shoulder Flexion Extension AAROM with Dowel   x10  Discussed supine vs reclined  Yes + HEP 08/18/22     Supine  Shoulder Abduction AAROM with Dowel  x10  Discussed supine vs reclined  N + HEP 08/18/22     Supine Shoulder Horizontal Abduction Adduction AAROM with Dowel   x10  Discussed supine vs reclined  N + HEP 08/18/22     Supine Shoulder External Rotation AAROM with Dowel   x10  Discussed supine vs reclined  Yes + HEP 08/18/22    Supine Shoulder External Rotation with Dowel  - x10 Discussed supine vs reclined Yes + HEP 08/18/22   Seated Shoulder Shrugs    x10 Discussed supine vs reclined N + HEP 08/18/22   Standing Scapular Depression x10 Discussed supine vs reclined N + HEP 08/18/22   Supine chest press with wand x10 3# Y   4 way isometrics  x5 each  Y   Scapular retractions with t-band 10 red Y   Stair handrail wipe 10  Y   UBE 5 minutes  Y   Access Code: [email or web address]  URL: [email or web address]  Date: 08/19/2022  Prepared by: Jonell Cluck    Exercises  - Supine Shoulder Flexion Extension AAROM with Dowel  - 1-3 x daily - 7 x weekly - 10-15 reps  - Supine Shoulder Abduction AAROM with Dowel  - 1-3 x daily - 7 x weekly - 10-15 reps  - Supine Shoulder Horizontal Abduction Adduction AAROM with Dowel  - 1-3 x daily - 7 x weekly - 10-15 reps  - Supine Shoulder  External Rotation AAROM with Dowel  - 1-3 x daily - 7 x weekly - 10-15 reps  - Supine Shoulder External Rotation with Dowel  - 1-3 x daily - 7 x weekly - 10-15 reps  - Seated Scapular Retraction  - 1-3 x daily - 7 x weekly - 10-15 reps  - Seated Shoulder Shrugs  - 1-3 x daily - 7 x weekly - 10-15 reps  - Standing Scapular Depression  - 1-3 x daily - 7 x weekly - 10-15 reps     Assessment:Pt is making good progress with ROM and better tolerance for there ex. She reports no longer needing assistance for personal hygiene from her boyfriend and has been increasing some activity at work but still on restrictions.   Short term goals (4 weeks):               1. Patient to improve L shoulder AROM (flexion/abduction) to 120 degrees with pain rated <4/10 (PROGRESSING 08/31/22 125 degrees flexion, Abd: 100)               2. Patient to improve L grip strength >40#  (Progressing 08/31/22 30#)               3. Patient to have centralization of radicular symptoms noted by no radicular symptoms present in patients thumb and index fingers. (NOT MET 08/31/22)               4. Patient to improve functional IR/ER to C3/L3 to improve ADL's (PROGRESSING 08/31/22)  5. Resting pain level and proximal stability WFL to permit normal posturing of affected UE at rest. (PROGRESSING 08/31/22)           Long term goals (10 weeks):               1. Patient to return to full occupational demands/ work duty with no restriction (NOT MET 08/31/22)               2. Patient to be able return to ADL's (putting hair  up, getting bra on) without needing assistance from boyfriend/coworkers. (MET 08/31/22)               3. Patient to be able to perform personal hygiene not limited by L shoulder ROM. (PROGRESSING 08/31/22)               4. Patient to improve proximal stability of L shoulder girdle tolerating WB through L UE (PROGRESSING 08/31/22)     Plan: Continue with treatment progressing ROM and strengthening as tolerated.  Total Session Time 35 and Timed code  minutes 35  THERAPEUTIC EXERCISE 35 minutes      Aasim Restivo, PTA 08/31/2022 09:52

## 2022-09-01 ENCOUNTER — Encounter (HOSPITAL_COMMUNITY): Payer: Self-pay

## 2022-09-02 ENCOUNTER — Ambulatory Visit (HOSPITAL_COMMUNITY): Payer: Self-pay
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
## 2022-09-03 ENCOUNTER — Other Ambulatory Visit (HOSPITAL_COMMUNITY): Payer: Self-pay | Admitting: NURSE PRACTITIONER

## 2022-09-03 DIAGNOSIS — M25512 Pain in left shoulder: Secondary | ICD-10-CM

## 2022-09-07 ENCOUNTER — Other Ambulatory Visit: Payer: Self-pay

## 2022-09-07 ENCOUNTER — Ambulatory Visit (HOSPITAL_COMMUNITY)
Admission: RE | Admit: 2022-09-07 | Discharge: 2022-09-07 | Disposition: A | Discharge status: Still In Hospital | Payer: Medicaid Other | Source: Ambulatory Visit | Attending: NURSE PRACTITIONER | Admitting: NURSE PRACTITIONER

## 2022-09-07 NOTE — PT Treatment (Signed)
[facility]  Outpatient Physical Therapy  [place], [address]  [phone]  [phone]    Physical Therapy Treatment Note    Date: 09/07/2022  Patient's Name: Caitlyn Gomez  Date of Birth: Dec 08, 1975      Visit #/POC: 52 of 55  Authorization: 53 of [id number] CY  POC Signed?: Yes  POC Ends: 10/07/22  Next Progress Note Due: 2/31/24        Evaluating Physical Therapist: Jonell Cluck, DPT  PT diagnosis/Reason for Referral: L shoulder pain  Next Scheduled Physician Appointment: 09/10/21  Allergies/Contraindications:            Subjective: Reports that her shoulder pain is around a 3-4/10. States that she went to see Dr. Fanny Dance and he is sending her for an EMG because he feels like her cervical myelopathy is getting worse because of her tingling and shaking in her hands and now she mentions tingling in her big toes.    Objective: Warm up on UBE followed by there ex per flow sheet below for ROM and scapular strengthening.     Circumference (midline of humerus) 08/12/22 08/19/22   Right  45 cm     Left 52 cm 51 cm      Patient-Specific Functional Score:  Problem 07/22/22 08/19/22 08/31/22   1. Work without restriction 2 2 4   $ 2. Personal care (Hair brushing/makeup) 0 0 6   3. Personal hygiene 0 0 9   TOTAL .667 .667 6.33      OBJECTIVE     Shoulder ROM     (PROM= Supine) 07/22/22 PROM/AAROM (Supine) AROM (Sitting) AROM 08/31/22   Flexion 90 125 degrees /130 degrees 100 degrees 115   Abduction 90 143 Degrees/ 132 degrees N/T 100   IR 8  L4 L4    ROM comment: End feel for PROM flexion and abduction was empty (pain limiting)     Strength    Left 08/19/22   Flexion 2+   Abduction 2+     Grip strength:    07/22/22 08/19/22 08/31/22   Right 30#  55#    Left 25# (tremor ing in L hand) 25# (less shakiness) 30#      EXERCISE/ACTIVITY NAME REPETITIONS RESISTANCE COMPLETED THIS DOS   Circular Shoulder Pendulum with Table Support 1-2 mins P/AAROM No + HEP 07/22/22   Horizontal Shoulder  Pendulum with Table Support  1-2 mins P/AAROM No + HEP 07/22/22   Flexion-Extension Shoulder Pendulum with Table Support 1-2 mins P/AAROM No + HEP 07/22/22   Seated Shoulder Abduction Towel Slide at Table Top 2x10 P/AAROM No + HEP 07/22/22   Seated Shoulder Flexion Towel Slide at Table Top 2x10 P/AAROM No + HEP 07/22/22   Seated Scapular Retraction   2x10 P/AAROM No + HEP 07/22/22   [place]    8 minutes   N   IFC x e-stim    10 minutes   Yes   Kinesiotaping for forward humeral head and support        Yes    Tendon glides for hands       (hard copy in chart) N + HEP 08/12/22    MFR with rock blade- bicep/RC         N    Supine Shoulder Flexion Extension AAROM with Dowel   x10  Discussed supine vs reclined  Yes + HEP 08/18/22     Supine Shoulder Abduction AAROM with Dowel  x10  Discussed supine vs reclined  N + HEP 08/18/22     Supine Shoulder Horizontal Abduction Adduction AAROM with Dowel   x10  Discussed supine vs reclined  N + HEP 08/18/22     Supine Shoulder External Rotation AAROM with Dowel   x10  Discussed supine vs reclined  Yes + HEP 08/18/22    Supine Shoulder External Rotation with Dowel  - x10 Discussed supine vs reclined Yes + HEP 08/18/22   Seated Shoulder Shrugs    x10 Discussed supine vs reclined N + HEP 08/18/22   Standing Scapular Depression x10 Discussed supine vs reclined N + HEP 08/18/22   Supine chest press with wand 2x10 3# Y   4 way isometrics  x5 each  N   Scapular retractions with t-band 15 red Y   Wall  wipe -flexion/daigonals 5 each  Y   UBE 5 minutes  Y   ER/IR isotonics with band 10 each yellow Y   Access Code: [email or web address]  URL: [email or web address]  Date: 08/19/2022  Prepared by: Jonell Cluck    Exercises  - Supine Shoulder Flexion Extension AAROM with Dowel  - 1-3 x daily - 7 x weekly - 10-15 reps  - Supine Shoulder Abduction AAROM with Dowel  - 1-3 x daily - 7 x weekly - 10-15 reps  - Supine Shoulder Horizontal Abduction Adduction AAROM with Dowel  - 1-3 x daily - 7 x weekly - 10-15 reps  - Supine  Shoulder External Rotation AAROM with Dowel  - 1-3 x daily - 7 x weekly - 10-15 reps  - Supine Shoulder External Rotation with Dowel  - 1-3 x daily - 7 x weekly - 10-15 reps  - Seated Scapular Retraction  - 1-3 x daily - 7 x weekly - 10-15 reps  - Seated Shoulder Shrugs  - 1-3 x daily - 7 x weekly - 10-15 reps  - Standing Scapular Depression  - 1-3 x daily - 7 x weekly - 10-15 reps     Assessment: Progressed strengthening exercises with no adverse effects.      Short term goals (4 weeks):               1. Patient to improve L shoulder AROM (flexion/abduction) to 120 degrees with pain rated <4/10 (PROGRESSING 08/31/22 125 degrees flexion, Abd: 100)               2. Patient to improve L grip strength >40#  (Progressing 08/31/22 30#)               3. Patient to have centralization of radicular symptoms noted by no radicular symptoms present in patients thumb and index fingers. (NOT MET 08/31/22)               4. Patient to improve functional IR/ER to C3/L3 to improve ADL's (PROGRESSING 08/31/22)  5. Resting pain level and proximal stability WFL to permit normal posturing of affected UE at rest. (PROGRESSING 08/31/22)           Long term goals (10 weeks):               1. Patient to return to full occupational demands/ work duty with no restriction (NOT MET 08/31/22)               2. Patient to be able return to ADL's (putting hair up, getting bra on) without needing assistance from boyfriend/coworkers. (MET 08/31/22)  3. Patient to be able to perform personal hygiene not limited by L shoulder ROM. (PROGRESSING 08/31/22)               4. Patient to improve proximal stability of L shoulder girdle tolerating WB through L UE (PROGRESSING 08/31/22)     Plan: Continue with treatment progressing ROM and strengthening as tolerated.  Total Session Time 35 and Timed code minutes 35  THERAPEUTIC EXERCISE 35 minutes      Matisse Salais, PTA 09/07/2022 10:28

## 2022-09-09 ENCOUNTER — Ambulatory Visit (HOSPITAL_COMMUNITY)
Admission: RE | Admit: 2022-09-09 | Discharge: 2022-09-09 | Disposition: A | Discharge status: Still In Hospital | Payer: Medicaid Other | Source: Ambulatory Visit | Attending: NURSE PRACTITIONER | Admitting: NURSE PRACTITIONER

## 2022-09-09 NOTE — PT Treatment (Signed)
[facility]  Outpatient Physical Therapy  [place], [address]  [phone]  [phone]    Physical Therapy Progress Note    Date: 09/09/2022  Patient's Name: Caitlyn Gomez  Date of Birth: Oct 20, 1975      Visit #/POC: 62 of 44  Authorization: [id number] of [id number] CY  POC Signed?: Yes  POC Ends: 10/07/22  Next Progress Note Due: [id number] (recert)        Evaluating Physical Therapist: Jonell Cluck, DPT  PT diagnosis/Reason for Referral: L shoulder pain (initial order) New order as of 09/09/22 to treat for cervical radiculopathy vs myelopathy.  Next Scheduled Physician Appointment:   09/17/22 =   09/23/22 = Dr. Owens Shark (neurosurgeon i believe)  12/21/22 = Nerve conduction (hoping for sooner)  Contraindications: Chiari malformation       Subjective: Patient has a complex PMH including rotator cuff repair which was never fully rehabbed, Chiari Malformation, cervical myelopathy. At initial evaluation pt wanted to focus on her L shoulder as this was her CC. Patient feels as if she has made good improvement in L shoulder ROM and after recent follow up with provider they noticed worsening of her cervical myelopathy and want to address this in therapy as well. Patient notes episodes of incontinence which started 1 year ago and has been worsening.  She is required to wear pads daily now due to this. Pt has follow up with Dr. Owens Shark on 09/23/22 regarding this incontience. Pt also notes worsening of her tremors/shaking and numbness in B hands. Pt has a nerve conduction study scheduled for 12/21/22 which she's on a wait list to be seen sooner if possible. Pt has not been wearing the sling as much but does continue to report that she feels as if her shoulder has no support to it.     Neurologist progress report "Electrodiagnostic Evidence of a Severe acute Left C7, Moderate acute Left C6 and C8, Mild acute Left.  On the right she had a Moderate Right acute C8 and Mild acute Right C5,6,7  Cervical Radiculopathies. She notes that she had a severe fall in august of last year whereas immediately following her balance wasn't as good and she developed new onset bladder incontinence.  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  This is a Cervical Myelopathy centered at C7 on the left "     Patient-Specific Functional Score:  Problem 07/22/22 08/19/22 09/07/22   1. Work without restriction 2 2 4   $ 2. Personal care (Hair brushing/makeup) 0 0 6   3. Personal hygiene 0 0 9   TOTAL .667 .667 6.33        Objective: Focus was on cervical region today for re-assessment.     Circumference (midline of humerus) 08/12/22 08/19/22   Right  45 cm     Left 52 cm 51 cm      Shoulder ROM     (PROM= Supine) 07/22/22 PROM/AAROM (Supine)  08/19/22 AROM 09/09/22   Flexion 90 (crying/pain) 125 degrees /130 degrees 115   Abduction 90 (crying/pain) 143 Degrees/ 132 degrees 100   IR 8   L4      Strength    Left 08/19/22 09/09/22   Flexion 2+ 2+   Abduction 2+ 2+      Grip strength:    07/22/22 08/19/22 09/07/22   Right 30#  55#  Left 25# (tremor ing in L hand) 25# (less shakiness) 30#          Cervical AROM   09/09/22 Left Right   Flexion 25 degrees   Extension 25 degrees    Side bend  20 20   Rotation 25 25     Palpation: very tender in B suboccipitals, cervical facet joints B, upper trap     Special tests:   Hoffman = negative bilaterally    EXERCISE/ACTIVITY NAME REPETITIONS RESISTANCE COMPLETED THIS DOS   Circular Shoulder Pendulum with Table Support 1-2 mins P/AAROM No + HEP 07/22/22   Horizontal Shoulder Pendulum with Table Support  1-2 mins P/AAROM No + HEP 07/22/22   Flexion-Extension Shoulder Pendulum with Table Support 1-2 mins P/AAROM No + HEP 07/22/22   Seated Shoulder Abduction Towel Slide at Table Top 2x10 P/AAROM No + HEP 07/22/22   Seated Shoulder Flexion Towel Slide at Table Top 2x10 P/AAROM No + HEP 07/22/22   Seated Scapular Retraction   2x10 P/AAROM No + HEP  07/22/22   [place]    8 minutes   N   IFC x e-stim    10 minutes   No   Kinesiotaping for forward humeral head and support        No    Tendon glides for hands       (hard copy in chart) N + HEP 08/12/22    MFR with rock blade- bicep/RC         N    Supine Shoulder Flexion Extension AAROM with Dowel   x10  Discussed supine vs reclined  N + HEP 08/18/22     Supine Shoulder Abduction AAROM with Dowel  x10  Discussed supine vs reclined  N + HEP 08/18/22     Supine Shoulder Horizontal Abduction Adduction AAROM with Dowel   x10  Discussed supine vs reclined  N + HEP 08/18/22     Supine Shoulder External Rotation AAROM with Dowel   x10  Discussed supine vs reclined  N + HEP 08/18/22    Supine Shoulder External Rotation with Dowel  - x10 Discussed supine vs reclined N + HEP 08/18/22   Seated Shoulder Shrugs    x10 Discussed supine vs reclined N + HEP 08/18/22   Standing Scapular Depression x10 Discussed supine vs reclined N + HEP 08/18/22   Supine chest press with wand 2x10 3# n   4 way isometrics  x5 each   N   Scapular retractions with t-band 15 red n   Wall  wipe -flexion/daigonals 5 each   n   UBE 5 minutes   n   ER/IR isotonics with band 10 each yellow n   Cervical isometrics   Flexion  Extension  Side bend  Rotation 10 x 5 second holds  Yes + HEP 09/09/22   Cervical AROM all planes (gentle) x10  Yes +HEP 09/09/22   Levator stretch without UE 2 x 20  Yes +HEP 09/09/22   UT stretch without UE 2 X 20  Yes + 09/09/22         Access Code: [email or web address]  URL: [email or web address]  Date: 09/09/2022  Prepared by: Jonell Cluck    Exercises  - Supine Isometric Neck Extension  - 1-2 x daily - 7 x weekly - 10 reps - 5 seconds hold  - Supine Isometric Neck Flexion  - 1-2 x daily - 7 x weekly - 10 reps - 5 seconds hold  - Supine Isometric Neck  Rotation  - 1-2 x daily - 7 x weekly - 10 reps - 5 seconds hold  - Supine Isometric Neck Sidebend  - 1-2 x daily - 7 x weekly - 10 reps - 5 secs hold  - Seated Cervical Rotation AROM  - 1 x daily - 7 x  weekly  - Seated Cervical Sidebending AROM  - 7 x weekly  - Seated Cervical Extension AROM  - 7 x weekly  - Seated Cervical Flexion AROM  - 7 x weekly  - Seated Upper Trapezius Stretch  - 1-2 x daily - 7 x weekly - 3 reps - 30 sec hold  - Gentle Levator Scapulae Stretch  - 1-2 x daily - 7 x weekly - 3 reps - 30 seconds hold  - Supine Shoulder Flexion Extension AAROM with Dowel  - 1-3 x daily - 7 x weekly - 10-15 reps  - Supine Shoulder Abduction AAROM with Dowel  - 1-3 x daily - 7 x weekly - 10-15 reps  - Supine Shoulder Horizontal Abduction Adduction AAROM with Dowel  - 1-3 x daily - 7 x weekly - 10-15 reps  - Supine Shoulder External Rotation AAROM with Dowel  - 1-3 x daily - 7 x weekly - 10-15 reps  - Seated Scapular Retraction  - 1-3 x daily - 7 x weekly - 10-15 reps  - Seated Shoulder Shrugs  - 1-3 x daily - 7 x weekly - 10-15 reps      Assessment:  Patient has attended 10 PT sessions since [facility] in beginning of January with order to treat for L shoulder pain. PT POC initially focused on L shoulder pain and has made significant improvements with pt able to tolerate AROM of flexion/abduction to 100-115 degrees. Ortho called PT on 2/21 as they sent over a new order to treat cervical region as well. Progress note focused on gathering measures regarding cervical region. Pt has a PMH which includes chiari malformation Type 1 with new referral for cervical radiculopathy/myelopathy. Pt has completed 4 weeks of conservative treatment with some improvement however last progress note PT recommended further imaging for ruling in/out differential diagnosis. Pt subjective reporting indicates increasing difficult with balance, progressive worsening of incontinence all of which are RED flags in this patients case. This still has not taken place but PT is still recommending further imaging/medical management takes place. PT POC to continue focusing on limitations in L shoulder AROM and strength as well as introduce postural  re-education, cervical ROM/stability to minimize deficits residual from radiculopathy and myelopathy due to new order. PT cannot stress the importance of expediting further imaging medical management due to red flags noted throughout this progress note.       Short term goals (4 weeks):               1. Patient to improve L shoulder AROM (flexion/abduction) to 120 degrees with pain rated <4/10 (PROGRESSING 09/09/22)               2. Patient to improve L grip strength >40#  (Progressing 09/09/22 30#)               3. Patient to have centralization of radicular symptoms noted by no radicular symptoms present in patients thumb and index fingers. (NOT MET 09/09/22)               4. Patient to improve functional IR/ER to C3/L3 to improve ADL's (PROGRESSING 09/09/22)  5. Resting pain level and proximal stability  WFL to permit normal posturing of affected UE at rest. (PROGRESSING 09/09/22)  6. Patient to improve cervical flexion AROM to 30 degrees or greater without reproduction of pain (NEW 09/09/22)       Long term goals (10 weeks):               1. Patient to return to full occupational demands/ work duty with no restriction (NOT MET 09/09/22)               2. Patient to be able return to ADL's (putting hair up, getting bra on) without needing assistance from boyfriend/coworkers. (MET 08/31/22)               3. Patient to be able to perform personal hygiene not limited by L shoulder ROM. (PROGRESSING 09/09/22)               4. Patient to improve proximal stability of L shoulder girdle tolerating WB through L UE (PROGRESSING 09/09/22)     Plan: Continue with treatment progressing ROM and strengthening as tolerated to both cervical and L shoulder.    Total Session Time 45 and Timed code minutes 37  THERAPEUTIC EXERCISE 37 minutes      Jonell Cluck, PT  09/09/2022, 10:35

## 2022-09-14 ENCOUNTER — Other Ambulatory Visit: Payer: Self-pay

## 2022-09-14 ENCOUNTER — Ambulatory Visit
Admission: RE | Admit: 2022-09-14 | Discharge: 2022-09-14 | Disposition: A | Discharge status: Still In Hospital | Payer: Medicaid Other | Source: Ambulatory Visit | Attending: NURSE PRACTITIONER | Admitting: NURSE PRACTITIONER

## 2022-09-14 NOTE — PT Treatment (Signed)
[facility]  Outpatient Physical Therapy  [place], [address]  [id number]  [phone]    Physical Therapy Progress Note    Date: 09/14/2022  Patient's Name: Caitlyn Gomez  Date of Birth: 1975/08/25      Visit #/POC: 45 of 55  Authorization: [id number] of [id number] CY  POC Signed?: Yes  POC Ends: 10/07/22  Next Progress Note Due: [id number] (recert)        Evaluating Physical Therapist: Jonell Cluck, DPT  PT diagnosis/Reason for Referral: L shoulder pain (initial order) New order as of 09/09/22 to treat for cervical radiculopathy vs myelopathy.  Next Scheduled Physician Appointment:   09/17/22 =   09/23/22 = Dr. Owens Shark (neurosurgeon i believe)  12/21/22 = Nerve conduction (hoping for sooner)  Contraindications: Chiari malformation       Subjective: Pt reports that her pain level today is a 8/10. Last night and this morning it was a 10/10 but took a Tylenol and used some ice. Notes that the pain is up into her face and all the way down into her L thumb.    Patient-Specific Functional Score:  Problem 07/22/22 08/19/22 09/07/22   1. Work without restriction '2 2 4   '$ 2. Personal care (Hair brushing/makeup) 0 0 6   3. Personal hygiene 0 0 9   TOTAL .667 .667 6.33        Objective: Focus was on cervical region today for re-assessment.     Circumference (midline of humerus) 08/12/22 08/19/22   Right  45 cm     Left 52 cm 51 cm      Shoulder ROM     (PROM= Supine) 07/22/22 PROM/AAROM (Supine)  08/19/22 AROM 09/09/22   Flexion 90 (crying/pain) 125 degrees /130 degrees 115   Abduction 90 (crying/pain) 143 Degrees/ 132 degrees 100   IR 8   L4      Strength    Left 08/19/22 09/09/22   Flexion 2+ 2+   Abduction 2+ 2+      Grip strength:    07/22/22 08/19/22 09/07/22   Right 30#  55#     Left 25# (tremor ing in L hand) 25# (less shakiness) 30#          Cervical AROM   09/09/22 Left Right   Flexion 25 degrees   Extension 25 degrees    Side bend  20 20   Rotation 25 25     EXERCISE/ACTIVITY NAME  REPETITIONS RESISTANCE COMPLETED THIS DOS   Circular Shoulder Pendulum with Table Support 1-2 mins P/AAROM No + HEP 07/22/22   Horizontal Shoulder Pendulum with Table Support  1-2 mins P/AAROM No + HEP 07/22/22   Flexion-Extension Shoulder Pendulum with Table Support 1-2 mins P/AAROM No + HEP 07/22/22   Seated Shoulder Abduction Towel Slide at Table Top 2x10 P/AAROM No + HEP 07/22/22   Seated Shoulder Flexion Towel Slide at Table Top 2x10 P/AAROM No + HEP 07/22/22   Seated Scapular Retraction   2x10 P/AAROM No + HEP 07/22/22   [place]    8 minutes   N   IFC x e-stim    10 minutes   No   Kinesiotaping for forward humeral head and support        No    Tendon glides for hands       (hard copy in chart) N + HEP 08/12/22    MFR with rock blade- bicep/RC  N    Supine Shoulder Flexion Extension AAROM with Dowel   x10  Discussed supine vs reclined  N + HEP 08/18/22     Supine Shoulder Abduction AAROM with Dowel  x10  Discussed supine vs reclined  N + HEP 08/18/22     Supine Shoulder Horizontal Abduction Adduction AAROM with Dowel   x10  Discussed supine vs reclined  N + HEP 08/18/22     Supine Shoulder External Rotation AAROM with Dowel   x10  Discussed supine vs reclined  N + HEP 08/18/22    Supine Shoulder External Rotation with Dowel  - x10 Discussed supine vs reclined N + HEP 08/18/22   Seated Shoulder Shrugs    x10 Discussed supine vs reclined N + HEP 08/18/22   Standing Scapular Depression x10 Discussed supine vs reclined N + HEP 08/18/22   Supine chest press with wand 2x10 3# n   4 way isometrics  x5 each   N   Scapular retractions with t-band 15 red y   Wall  wipe -flexion/daigonals 5 each   n   UBE 5 minutes   Y   ER/IR isotonics with band 10 each yellow n   Cervical isometrics   Flexion  Extension  Side bend  Rotation 10 x 5 second holds  Yes + HEP 09/09/22   Cervical AROM all planes (gentle) x10  Yes +HEP 09/09/22   Levator stretch without UE 2 x 20  Yes +HEP 09/09/22   UT stretch without UE 2 X 20  Yes + 09/09/22         Access Code:  [email or web address]  URL: [email or web address]  Date: 09/09/2022  Prepared by: Jonell Cluck    Exercises  - Supine Isometric Neck Extension  - 1-2 x daily - 7 x weekly - 10 reps - 5 seconds hold  - Supine Isometric Neck Flexion  - 1-2 x daily - 7 x weekly - 10 reps - 5 seconds hold  - Supine Isometric Neck Rotation  - 1-2 x daily - 7 x weekly - 10 reps - 5 seconds hold  - Supine Isometric Neck Sidebend  - 1-2 x daily - 7 x weekly - 10 reps - 5 secs hold  - Seated Cervical Rotation AROM  - 1 x daily - 7 x weekly  - Seated Cervical Sidebending AROM  - 7 x weekly  - Seated Cervical Extension AROM  - 7 x weekly  - Seated Cervical Flexion AROM  - 7 x weekly  - Seated Upper Trapezius Stretch  - 1-2 x daily - 7 x weekly - 3 reps - 30 sec hold  - Gentle Levator Scapulae Stretch  - 1-2 x daily - 7 x weekly - 3 reps - 30 seconds hold  - Supine Shoulder Flexion Extension AAROM with Dowel  - 1-3 x daily - 7 x weekly - 10-15 reps  - Supine Shoulder Abduction AAROM with Dowel  - 1-3 x daily - 7 x weekly - 10-15 reps  - Supine Shoulder Horizontal Abduction Adduction AAROM with Dowel  - 1-3 x daily - 7 x weekly - 10-15 reps  - Supine Shoulder External Rotation AAROM with Dowel  - 1-3 x daily - 7 x weekly - 10-15 reps  - Seated Scapular Retraction  - 1-3 x daily - 7 x weekly - 10-15 reps  - Seated Shoulder Shrugs  - 1-3 x daily - 7 x weekly - 10-15 reps      Assessment: Pt has  reports of increased pain today that is radiating into her face. Limited exercises today due to the increase in pain reported.     Short term goals (4 weeks):               1. Patient to improve L shoulder AROM (flexion/abduction) to 120 degrees with pain rated <4/10 (PROGRESSING 09/09/22)               2. Patient to improve L grip strength >40#  (Progressing 09/09/22 30#)               3. Patient to have centralization of radicular symptoms noted by no radicular symptoms present in patients thumb and index fingers. (NOT MET 09/09/22)               4. Patient to  improve functional IR/ER to C3/L3 to improve ADL's (PROGRESSING 09/09/22)  5. Resting pain level and proximal stability WFL to permit normal posturing of affected UE at rest. (PROGRESSING 09/09/22)  6. Patient to improve cervical flexion AROM to 30 degrees or greater without reproduction of pain (NEW 09/09/22)       Long term goals (10 weeks):               1. Patient to return to full occupational demands/ work duty with no restriction (NOT MET 09/09/22)               2. Patient to be able return to ADL's (putting hair up, getting bra on) without needing assistance from boyfriend/coworkers. (MET 08/31/22)               3. Patient to be able to perform personal hygiene not limited by L shoulder ROM. (PROGRESSING 09/09/22)               4. Patient to improve proximal stability of L shoulder girdle tolerating WB through L UE (PROGRESSING 09/09/22)     Plan: Continue with treatment progressing ROM and strengthening as tolerated to both cervical and L shoulder.    Total Session Time 35 and Timed code minutes 35  THERAPEUTIC EXERCISE 35 minutes    Kortney Schoenfelder, PTA 09/14/2022 11:10

## 2022-09-16 ENCOUNTER — Ambulatory Visit (HOSPITAL_COMMUNITY): Payer: Self-pay

## 2022-09-22 ENCOUNTER — Other Ambulatory Visit: Payer: Self-pay

## 2022-09-22 ENCOUNTER — Ambulatory Visit
Admission: RE | Admit: 2022-09-22 | Discharge: 2022-09-22 | Disposition: A | Discharge status: Still In Hospital | Payer: Medicaid Other | Source: Ambulatory Visit | Attending: NURSE PRACTITIONER | Admitting: NURSE PRACTITIONER

## 2022-09-22 DIAGNOSIS — M25512 Pain in left shoulder: Secondary | ICD-10-CM | POA: Insufficient documentation

## 2022-09-22 DIAGNOSIS — M25562 Pain in left knee: Secondary | ICD-10-CM | POA: Insufficient documentation

## 2022-09-22 NOTE — PT Treatment (Signed)
[facility]  Outpatient Physical Therapy  [place], [address]  [phone]  [phone]    Physical Therapy Discharge Note    Date: 09/22/2022  Patient's Name: Caitlyn Gomez  Date of Birth: 1976/03/19    Visit #/POC: 34 of 79  Authorization: [id number] of [id number] CY  POC Signed?: Yes  POC Ends: 10/07/22    Discharge Summary     Evaluating Physical Therapist: Jonell Cluck, DPT transferred to Jeanella Flattery, DPT  PT diagnosis/Reason for Referral: L shoulder pain (initial order) New order as of 09/09/22 to treat for cervical radiculopathy vs myelopathy.    Contraindications: Chiari malformation     Subjective: Pt reports that her pain level today is a 8/10. She states pain still reaches 10/10 daily. Pain radiates to L face and L arm most intensely but also R arm is tingling. NCV came back fairly normal but her doctor is suspicious for CRPS. She is being referred to pain management and is to have repeat MRI on L shoulder and spine. She has f/u with orthopedics tomorrow.     Patient-Specific Functional Score:  Problem 07/22/22 08/19/22 09/07/22 09/22/22   1. Work without restriction '2 2 4 4   '$ 2. Personal care (Hair brushing/makeup) 0 0 6 6   3. Personal hygiene 0 0 9 9   TOTAL .667 .667 6.33 6.33         Shoulder ROM     (PROM= Supine) 07/22/22 PROM/AAROM (Supine)  08/19/22 AROM 09/09/22 AROM 09/22/22   Flexion 90 (crying/pain) 125 degrees /130 degrees 115 115 pain   Abduction 90 (crying/pain) 143 Degrees/ 132 degrees 100 100 pain   IR 8   L4 L4 pain      Strength    Left 08/19/22 09/09/22 09/22/2022   Flexion 2+ 2+ 3-   Abduction 2+ 2+ 3-      Grip strength:    07/22/22 08/19/22 09/07/22 09/22/22   Right 30#  55#   35#   Left 25# (tremor ing in L hand) 25# (less shakiness) 30#    30#         Cervical AROM   09/22/22 Left Right   Flexion 25 degrees   Extension 25 degrees    Side bend  20 20   Rotation 25 25      EXERCISE/ACTIVITY NAME REPETITIONS RESISTANCE COMPLETED THIS DOS   Circular  Shoulder Pendulum with Table Support 1-2 mins P/AAROM No + HEP 07/22/22   Horizontal Shoulder Pendulum with Table Support  1-2 mins P/AAROM No + HEP 07/22/22   Flexion-Extension Shoulder Pendulum with Table Support 1-2 mins P/AAROM No + HEP 07/22/22   Seated Shoulder Abduction Towel Slide at Table Top 2x10 P/AAROM No + HEP 07/22/22   Seated Shoulder Flexion Towel Slide at Table Top 2x10 P/AAROM No + HEP 07/22/22   Seated Scapular Retraction   2x10 P/AAROM No + HEP 07/22/22   [place]    8 minutes   N   IFC x e-stim    10 minutes   No   Kinesiotaping for forward humeral head and support        No    Tendon glides for hands       (hard copy in chart) N + HEP 08/12/22    MFR with rock blade- bicep/RC         N    Supine Shoulder Flexion Extension AAROM with Dowel   x10  Discussed supine  vs reclined  N + HEP 08/18/22     Supine Shoulder Abduction AAROM with Dowel  x10  Discussed supine vs reclined  N + HEP 08/18/22     Supine Shoulder Horizontal Abduction Adduction AAROM with Dowel   x10  Discussed supine vs reclined  N + HEP 08/18/22     Supine Shoulder External Rotation AAROM with Dowel   x10  Discussed supine vs reclined  N + HEP 08/18/22    Supine Shoulder External Rotation with Dowel  - x10 Discussed supine vs reclined N + HEP 08/18/22   Seated Shoulder Shrugs    x10 Discussed supine vs reclined N + HEP 08/18/22   Standing Scapular Depression x10 Discussed supine vs reclined N + HEP 08/18/22   Supine chest press with wand 2x10 3# n   4 way isometrics  x5 each   N   Scapular retractions with t-band 15 red y   Wall  wipe -flexion/daigonals 5 each   n   UBE 5 minutes   Y   ER/IR isotonics with band 10 each yellow n   Cervical isometrics   Flexion  Extension  Side bend  Rotation 10 x 5 second holds   Yes + HEP 09/09/22   Cervical AROM all planes (gentle) x10   Yes +HEP 09/09/22   Levator stretch without UE 2 x 20   Yes +HEP 09/09/22   UT stretch without UE 2 X 20   Yes + 09/09/22             Access Code: [email or web address]  URL:  [email or web address]  Date: 09/09/2022  Prepared by: Jonell Cluck     Exercises  - Supine Isometric Neck Extension  - 1-2 x daily - 7 x weekly - 10 reps - 5 seconds hold  - Supine Isometric Neck Flexion  - 1-2 x daily - 7 x weekly - 10 reps - 5 seconds hold  - Supine Isometric Neck Rotation  - 1-2 x daily - 7 x weekly - 10 reps - 5 seconds hold  - Supine Isometric Neck Sidebend  - 1-2 x daily - 7 x weekly - 10 reps - 5 secs hold  - Seated Cervical Rotation AROM  - 1 x daily - 7 x weekly  - Seated Cervical Sidebending AROM  - 7 x weekly  - Seated Cervical Extension AROM  - 7 x weekly  - Seated Cervical Flexion AROM  - 7 x weekly  - Seated Upper Trapezius Stretch  - 1-2 x daily - 7 x weekly - 3 reps - 30 sec hold  - Gentle Levator Scapulae Stretch  - 1-2 x daily - 7 x weekly - 3 reps - 30 seconds hold  - Supine Shoulder Flexion Extension AAROM with Dowel  - 1-3 x daily - 7 x weekly - 10-15 reps  - Supine Shoulder Abduction AAROM with Dowel  - 1-3 x daily - 7 x weekly - 10-15 reps  - Supine Shoulder Horizontal Abduction Adduction AAROM with Dowel  - 1-3 x daily - 7 x weekly - 10-15 reps  - Supine Shoulder External Rotation AAROM with Dowel  - 1-3 x daily - 7 x weekly - 10-15 reps  - Seated Scapular Retraction  - 1-3 x daily - 7 x weekly - 10-15 reps  - Seated Shoulder Shrugs  - 1-3 x daily - 7 x weekly - 10-15 reps        Assessment: Pt demos no progress towards goals over the  past 2 weeks and states she often has increased pain for 1-2 days after PT treatments. PT performed and reviewed HEP with patient and encouraged her to only perform activities in ranges which do not exacerbate pain. She has appointments with orthopedics and PCP as well as pain management to continue managing neck pain and L arm pain. At this time, as patient has not progressed towards goals and is very limited by pain, PT recommends D/C to HEP only. She was encouraged to call back in the future with any additional PT needs.       Short  term goals (4 weeks):               1. Patient to improve L shoulder AROM (flexion/abduction) to 120 degrees with pain rated <4/10 (Not Met 09/22/2022)               2. Patient to improve L grip strength >40#  (Not Met 09/22/2022)               3. Patient to have centralization of radicular symptoms noted by no radicular symptoms present in patients thumb and index fingers. (Not Met 09/22/2022)               4. Patient to improve functional IR/ER to C3/L3 to improve ADL's (Not Met 09/22/2022)  5. Resting pain level and proximal stability WFL to permit normal posturing of affected UE at rest. (Not Met 09/22/2022)  6. Patient to improve cervical flexion AROM to 30 degrees or greater without reproduction of pain (Not Met 09/22/2022)        Long term goals (10 weeks):               1. Patient to return to full occupational demands/ work duty with no restriction (Not Met 09/22/2022)               2. Patient to be able return to ADL's (putting hair up, getting bra on) without needing assistance from boyfriend/coworkers. (Not Met 09/22/2022)               3. Patient to be able to perform personal hygiene not limited by L shoulder ROM. (Not Met 09/22/2022)               4. Patient to improve proximal stability of L shoulder girdle tolerating WB through L UE (Not Met 09/22/2022)     Plan: DC to HEP    Total Session Time 30 and Timed code minutes 30  THERAPEUTIC EXERCISE 30 minutes      Jeanella Flattery, PT  09/22/2022, 14:22

## 2022-09-23 ENCOUNTER — Ambulatory Visit (HOSPITAL_COMMUNITY): Payer: Self-pay

## 2022-09-23 ENCOUNTER — Other Ambulatory Visit (HOSPITAL_COMMUNITY): Payer: Self-pay | Admitting: NURSE PRACTITIONER

## 2022-09-23 ENCOUNTER — Encounter (HOSPITAL_COMMUNITY): Payer: Self-pay

## 2022-09-23 DIAGNOSIS — M25512 Pain in left shoulder: Secondary | ICD-10-CM
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
## 2022-09-24 ENCOUNTER — Ambulatory Visit (HOSPITAL_COMMUNITY): Payer: Self-pay

## 2022-10-02 ENCOUNTER — Other Ambulatory Visit (INDEPENDENT_AMBULATORY_CARE_PROVIDER_SITE_OTHER): Payer: Self-pay | Admitting: Student in an Organized Health Care Education/Training Program

## 2022-10-02 ENCOUNTER — Ambulatory Visit (INDEPENDENT_AMBULATORY_CARE_PROVIDER_SITE_OTHER): Payer: Medicaid Other | Admitting: Student in an Organized Health Care Education/Training Program

## 2022-10-02 ENCOUNTER — Other Ambulatory Visit: Payer: Self-pay

## 2022-10-02 VITALS — BP 158/104 | HR 74 | Ht 66.0 in | Wt 338.0 lb

## 2022-10-02 DIAGNOSIS — N393 Stress incontinence (female) (male): Secondary | ICD-10-CM

## 2022-10-02 DIAGNOSIS — Z01818 Encounter for other preprocedural examination: Secondary | ICD-10-CM

## 2022-10-02 DIAGNOSIS — R339 Retention of urine, unspecified: Secondary | ICD-10-CM
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
## 2022-10-05 ENCOUNTER — Other Ambulatory Visit: Payer: Self-pay

## 2022-10-05 ENCOUNTER — Ambulatory Visit
Admission: RE | Admit: 2022-10-05 | Discharge: 2022-10-05 | Disposition: A | Discharge status: Home / Self Care | Payer: Medicaid Other | Source: Ambulatory Visit | Attending: NURSE PRACTITIONER | Admitting: NURSE PRACTITIONER

## 2022-10-05 DIAGNOSIS — M25512 Pain in left shoulder: Secondary | ICD-10-CM | POA: Insufficient documentation

## 2022-10-20 ENCOUNTER — Ambulatory Visit: Payer: Medicaid Other | Attending: Student in an Organized Health Care Education/Training Program

## 2022-10-20 ENCOUNTER — Other Ambulatory Visit: Payer: Self-pay

## 2022-10-20 DIAGNOSIS — Z01818 Encounter for other preprocedural examination: Secondary | ICD-10-CM | POA: Insufficient documentation

## 2022-10-20 LAB — BASIC METABOLIC PANEL
ANION GAP: 4 mmol/L (ref 4–13)
BUN/CREA RATIO: 13 (ref 6–22)
BUN: 11 mg/dL (ref 7–25)
CALCIUM: 9.2 mg/dL (ref 8.6–10.3)
CHLORIDE: 107 mmol/L (ref 98–107)
CO2 TOTAL: 28 mmol/L (ref 21–31)
CREATININE: 0.88 mg/dL (ref 0.60–1.30)
ESTIMATED GFR: 83 mL/min/{1.73_m2} (ref >59)
GLUCOSE: 87 mg/dL (ref 74–109)
OSMOLALITY, CALCULATED: 276 mOsm/kg (ref 270–290)
POTASSIUM: 4.2 mmol/L (ref 3.5–5.1)
SODIUM: 139 mmol/L (ref 136–145)

## 2022-10-20 LAB — CBC
HCT: 41.2 % (ref 31.2–41.9)
HGB: 13.7 g/dL (ref 10.9–14.3)
MCH: 28.7 pg (ref 24.7–32.8)
MCHC: 33.3 g/dL (ref 32.3–35.6)
MCV: 86.1 fL (ref 75.5–95.3)
MPV: 9 fL (ref 7.9–10.8)
PLATELETS: 270 10*3/uL (ref 140–440)
RBC: 4.79 10*6/uL (ref 3.63–4.92)
RDW: 13.8 % (ref 12.3–17.7)
WBC: 7.4 10*3/uL (ref 3.8–11.8)

## 2022-10-20 LAB — URINALYSIS, MICROSCOPIC
RBCS: 2 /hpf (ref <4)
SQUAMOUS EPITHELIAL: 2 /hpf (ref <28)
WBCS: 1 /hpf (ref <6)

## 2022-10-20 LAB — URINALYSIS, MACROSCOPIC
BILIRUBIN: NEGATIVE mg/dL
BLOOD: NEGATIVE mg/dL
GLUCOSE: NEGATIVE mg/dL
KETONES: NEGATIVE mg/dL
LEUKOCYTES: NEGATIVE WBCs/uL
NITRITE: NEGATIVE
PH: 7 (ref 5.0–9.0)
PROTEIN: NEGATIVE mg/dL
SPECIFIC GRAVITY: 1.02 (ref 1.002–1.030)
UROBILINOGEN: NORMAL mg/dL

## 2022-11-04 ENCOUNTER — Encounter (HOSPITAL_COMMUNITY): Payer: Self-pay | Admitting: Student in an Organized Health Care Education/Training Program

## 2022-11-04 ENCOUNTER — Ambulatory Visit (HOSPITAL_COMMUNITY): Payer: Medicaid Other | Admitting: Anesthesiology

## 2022-11-04 ENCOUNTER — Ambulatory Visit
Admission: RE | Admit: 2022-11-04 | Discharge: 2022-11-04 | Disposition: A | Discharge status: Home / Self Care | Payer: Medicaid Other | Source: Ambulatory Visit | Attending: Student in an Organized Health Care Education/Training Program | Admitting: Student in an Organized Health Care Education/Training Program

## 2022-11-04 ENCOUNTER — Encounter (HOSPITAL_COMMUNITY)
Admission: RE | Disposition: A | Discharge status: Home / Self Care | Payer: Self-pay | Source: Ambulatory Visit | Attending: Student in an Organized Health Care Education/Training Program

## 2022-11-04 ENCOUNTER — Other Ambulatory Visit: Payer: Self-pay

## 2022-11-04 DIAGNOSIS — N393 Stress incontinence (female) (male): Secondary | ICD-10-CM | POA: Insufficient documentation

## 2022-11-04 DIAGNOSIS — K219 Gastro-esophageal reflux disease without esophagitis: Secondary | ICD-10-CM | POA: Insufficient documentation

## 2022-11-04 DIAGNOSIS — Z6841 Body Mass Index (BMI) 40.0 and over, adult: Secondary | ICD-10-CM | POA: Insufficient documentation

## 2022-11-04 DIAGNOSIS — E785 Hyperlipidemia, unspecified: Secondary | ICD-10-CM | POA: Insufficient documentation

## 2022-11-04 DIAGNOSIS — M797 Fibromyalgia: Secondary | ICD-10-CM | POA: Insufficient documentation

## 2022-11-04 DIAGNOSIS — Z8673 Personal history of transient ischemic attack (TIA), and cerebral infarction without residual deficits: Secondary | ICD-10-CM | POA: Insufficient documentation

## 2022-11-04 DIAGNOSIS — K769 Liver disease, unspecified: Secondary | ICD-10-CM | POA: Insufficient documentation

## 2022-11-04 DIAGNOSIS — I10 Essential (primary) hypertension: Secondary | ICD-10-CM | POA: Insufficient documentation

## 2022-11-04 DIAGNOSIS — F32A Depression, unspecified: Secondary | ICD-10-CM | POA: Insufficient documentation

## 2022-11-04 DIAGNOSIS — J4489 Other specified chronic obstructive pulmonary disease: Secondary | ICD-10-CM | POA: Insufficient documentation

## 2022-11-04 DIAGNOSIS — I2510 Atherosclerotic heart disease of native coronary artery without angina pectoris: Secondary | ICD-10-CM | POA: Insufficient documentation

## 2022-11-04 DIAGNOSIS — F17210 Nicotine dependence, cigarettes, uncomplicated: Secondary | ICD-10-CM | POA: Insufficient documentation

## 2022-11-04 DIAGNOSIS — F419 Anxiety disorder, unspecified: Secondary | ICD-10-CM | POA: Insufficient documentation

## 2022-11-04 DIAGNOSIS — I4891 Unspecified atrial fibrillation: Secondary | ICD-10-CM | POA: Insufficient documentation

## 2022-11-04 HISTORY — PX: CYSTOSCOPY: SUR368

## 2022-11-04 SURGERY — VAGINAL SLING
Anesthesia: General | Site: Vagina | Wound class: Clean Contaminated Wounds-The respiratory, GI, Genital, or urinary

## 2022-11-04 MED ORDER — FENTANYL (PF) 50 MCG/ML INJECTION WRAPPER
INJECTION | Freq: Once | INTRAMUSCULAR | Status: DC | PRN
Start: 2022-11-04 — End: 2022-11-04
  Administered 2022-11-04: 100 ug via INTRAVENOUS
  Administered 2022-11-04 (×2): 50 ug via INTRAVENOUS

## 2022-11-04 MED ORDER — ACETAMINOPHEN 325 MG TABLET
650.0000 mg | ORAL_TABLET | Freq: Four times a day (QID) | ORAL | Status: DC | PRN
Start: 2022-11-04 — End: 2022-11-04
  Administered 2022-11-04: 650 mg via ORAL
  Filled 2022-11-04: qty 2

## 2022-11-04 MED ORDER — DIAZEPAM 10 MG TABLET
5.0000 mg | ORAL_TABLET | Freq: Once | ORAL | Status: DC | PRN
Start: 2022-11-04 — End: 2022-11-04

## 2022-11-04 MED ORDER — LIDOCAINE-EPINEPHRINE (PF) 2 %-1:200,000 INJECTION SOLUTION
Freq: Once | INTRAMUSCULAR | Status: DC | PRN
Start: 2022-11-04 — End: 2022-11-04
  Administered 2022-11-04: 10 mL via INTRAMUSCULAR

## 2022-11-04 MED ORDER — CEFAZOLIN 1 GRAM SOLUTION FOR INJECTION
INTRAMUSCULAR | Status: AC
Start: 2022-11-04 — End: 2022-11-04
  Filled 2022-11-04: qty 10

## 2022-11-04 MED ORDER — MORPHINE 10 MG/ML INJECTION WRAPPER
4.0000 mg | INTRAVENOUS | Status: DC | PRN
Start: 2022-11-04 — End: 2022-11-04
  Administered 2022-11-04: 4 mg via INTRAVENOUS
  Filled 2022-11-04: qty 1

## 2022-11-04 MED ORDER — FAMOTIDINE (PF) 20 MG/2 ML INTRAVENOUS SOLUTION
20.0000 mg | Freq: Once | INTRAVENOUS | Status: DC
Start: 2022-11-04 — End: 2022-11-04

## 2022-11-04 MED ORDER — SODIUM CHLORIDE 0.9 % (FLUSH) INJECTION SYRINGE
3.0000 mL | INJECTION | Freq: Three times a day (TID) | INTRAMUSCULAR | Status: DC
Start: 2022-11-04 — End: 2022-11-04

## 2022-11-04 MED ORDER — ROCURONIUM 10 MG/ML INTRAVENOUS SOLUTION
Freq: Once | INTRAVENOUS | Status: DC | PRN
Start: 2022-11-04 — End: 2022-11-04
  Administered 2022-11-04: 40 mg via INTRAVENOUS

## 2022-11-04 MED ORDER — FAMOTIDINE (PF) 20 MG/2 ML INTRAVENOUS SOLUTION
20.0000 mg | Freq: Once | INTRAVENOUS | Status: AC
Start: 2022-11-04 — End: 2022-11-04
  Administered 2022-11-04: 20 mg via INTRAVENOUS

## 2022-11-04 MED ORDER — DEXAMETHASONE SODIUM PHOSPHATE 4 MG/ML INJECTION SOLUTION
4.0000 mg | Freq: Once | INTRAMUSCULAR | Status: AC
Start: 2022-11-04 — End: 2022-11-04
  Administered 2022-11-04: 4 mg via INTRAVENOUS

## 2022-11-04 MED ORDER — PHENAZOPYRIDINE 100 MG TABLET
100.0000 mg | ORAL_TABLET | Freq: Three times a day (TID) | ORAL | Status: AC
Start: 2022-11-04 — End: 2022-11-04
  Administered 2022-11-04: 100 mg via ORAL
  Filled 2022-11-04: qty 1

## 2022-11-04 MED ORDER — ONDANSETRON HCL (PF) 4 MG/2 ML INJECTION SOLUTION
INTRAMUSCULAR | Status: AC
Start: 2022-11-04 — End: 2022-11-04
  Filled 2022-11-04: qty 2

## 2022-11-04 MED ORDER — ONDANSETRON HCL (PF) 4 MG/2 ML INJECTION SOLUTION
4.0000 mg | Freq: Three times a day (TID) | INTRAMUSCULAR | Status: DC | PRN
Start: 2022-11-04 — End: 2022-11-04

## 2022-11-04 MED ORDER — ALBUTEROL SULFATE HFA 90 MCG/ACTUATION AEROSOL INHALER
INHALATION_SPRAY | Freq: Once | RESPIRATORY_TRACT | Status: DC | PRN
Start: 2022-11-04 — End: 2022-11-04
  Administered 2022-11-04 (×2): 3 via RESPIRATORY_TRACT

## 2022-11-04 MED ORDER — TRAMADOL 37.5 MG-ACETAMINOPHEN 325 MG TABLET
1.0000 | ORAL_TABLET | ORAL | 0 refills | Status: DC | PRN
Start: 2022-11-04 — End: 2022-11-19

## 2022-11-04 MED ORDER — FENTANYL (PF) 50 MCG/ML INJECTION SOLUTION
INTRAMUSCULAR | Status: AC
Start: 2022-11-04 — End: 2022-11-04
  Filled 2022-11-04: qty 2

## 2022-11-04 MED ORDER — SODIUM CHLORIDE 0.9 % (FLUSH) INJECTION SYRINGE
3.0000 mL | INJECTION | INTRAMUSCULAR | Status: DC | PRN
Start: 2022-11-04 — End: 2022-11-04

## 2022-11-04 MED ORDER — KETOROLAC 30 MG/ML (1 ML) INJECTION SOLUTION
15.0000 mg | Freq: Once | INTRAMUSCULAR | Status: AC
Start: 2022-11-04 — End: 2022-11-04
  Administered 2022-11-04: 15 mg via INTRAVENOUS
  Filled 2022-11-04: qty 1

## 2022-11-04 MED ORDER — FAMOTIDINE (PF) 20 MG/2 ML INTRAVENOUS SOLUTION
INTRAVENOUS | Status: AC
Start: 2022-11-04 — End: 2022-11-04
  Filled 2022-11-04: qty 2

## 2022-11-04 MED ORDER — FENTANYL (PF) 50 MCG/ML INJECTION WRAPPER
25.0000 ug | INJECTION | INTRAMUSCULAR | Status: DC | PRN
Start: 2022-11-04 — End: 2022-11-04

## 2022-11-04 MED ORDER — FENTANYL (PF) 50 MCG/ML INJECTION WRAPPER
50.0000 ug | INJECTION | INTRAMUSCULAR | Status: DC | PRN
Start: 2022-11-04 — End: 2022-11-04
  Administered 2022-11-04 (×2): 50 ug via INTRAVENOUS

## 2022-11-04 MED ORDER — LIDOCAINE (PF) 100 MG/5 ML (2 %) INTRAVENOUS SYRINGE
INJECTION | Freq: Once | INTRAVENOUS | Status: DC | PRN
Start: 2022-11-04 — End: 2022-11-04
  Administered 2022-11-04: 80 mg via INTRAVENOUS

## 2022-11-04 MED ORDER — LACTATED RINGERS INTRAVENOUS SOLUTION
INTRAVENOUS | Status: DC | PRN
Start: 2022-11-04 — End: 2022-11-04

## 2022-11-04 MED ORDER — PROPOFOL 10 MG/ML IV BOLUS
INJECTION | Freq: Once | INTRAVENOUS | Status: DC | PRN
Start: 2022-11-04 — End: 2022-11-04
  Administered 2022-11-04: 200 mg via INTRAVENOUS

## 2022-11-04 MED ORDER — LIDOCAINE-EPINEPHRINE (PF) 2 %-1:200,000 INJECTION SOLUTION
INTRAMUSCULAR | Status: AC
Start: 2022-11-04 — End: 2022-11-04
  Filled 2022-11-04: qty 20

## 2022-11-04 MED ORDER — IPRATROPIUM 0.5 MG-ALBUTEROL 3 MG (2.5 MG BASE)/3 ML NEBULIZATION SOLN
3.0000 mL | INHALATION_SOLUTION | Freq: Once | RESPIRATORY_TRACT | Status: DC | PRN
Start: 2022-11-04 — End: 2022-11-04

## 2022-11-04 MED ORDER — LACTATED RINGERS INTRAVENOUS SOLUTION
INTRAVENOUS | Status: DC
Start: 2022-11-04 — End: 2022-11-04

## 2022-11-04 MED ORDER — MIDAZOLAM 5 MG/ML INJECTION WRAPPER
INTRAMUSCULAR | Status: AC
Start: 2022-11-04 — End: 2022-11-04
  Filled 2022-11-04: qty 1

## 2022-11-04 MED ORDER — SODIUM CHLORIDE 0.9 % INTRAVENOUS PIGGYBACK
INJECTION | INTRAVENOUS | Status: AC
Start: 2022-11-04 — End: 2022-11-04
  Filled 2022-11-04: qty 50

## 2022-11-04 MED ORDER — SUGAMMADEX 100 MG/ML INTRAVENOUS SOLUTION
Freq: Once | INTRAVENOUS | Status: DC | PRN
Start: 2022-11-04 — End: 2022-11-04
  Administered 2022-11-04: 500 mg via INTRAVENOUS

## 2022-11-04 MED ORDER — MIDAZOLAM 5 MG/ML INJECTION WRAPPER
1.5000 mg | Freq: Once | INTRAMUSCULAR | Status: DC | PRN
Start: 2022-11-04 — End: 2022-11-04

## 2022-11-04 MED ORDER — CEFAZOLIN 1 GRAM SOLUTION FOR INJECTION
Freq: Once | INTRAMUSCULAR | Status: DC | PRN
Start: 2022-11-04 — End: 2022-11-04
  Administered 2022-11-04: 3000 mg via INTRAVENOUS

## 2022-11-04 MED ORDER — TRAMADOL 50 MG TABLET
50.0000 mg | ORAL_TABLET | Freq: Four times a day (QID) | ORAL | Status: DC | PRN
Start: 2022-11-04 — End: 2022-11-04
  Administered 2022-11-04: 50 mg via ORAL
  Filled 2022-11-04 (×2): qty 1

## 2022-11-04 MED ORDER — DEXAMETHASONE SODIUM PHOSPHATE 4 MG/ML INJECTION SOLUTION
4.0000 mg | Freq: Once | INTRAMUSCULAR | Status: DC
Start: 2022-11-04 — End: 2022-11-04

## 2022-11-04 MED ORDER — HYDROCODONE 10 MG-ACETAMINOPHEN 325 MG TABLET
1.0000 | ORAL_TABLET | Freq: Four times a day (QID) | ORAL | 0 refills | Status: DC | PRN
Start: 2022-11-04 — End: 2022-11-19

## 2022-11-04 MED ORDER — ALBUTEROL SULFATE 2.5 MG/3 ML (0.083 %) SOLUTION FOR NEBULIZATION
2.5000 mg | INHALATION_SOLUTION | Freq: Once | RESPIRATORY_TRACT | Status: DC | PRN
Start: 2022-11-04 — End: 2022-11-04

## 2022-11-04 MED ORDER — ONDANSETRON HCL (PF) 4 MG/2 ML INJECTION SOLUTION
4.0000 mg | Freq: Once | INTRAMUSCULAR | Status: DC
Start: 2022-11-04 — End: 2022-11-04

## 2022-11-04 MED ORDER — ONDANSETRON HCL (PF) 4 MG/2 ML INJECTION SOLUTION
4.0000 mg | Freq: Once | INTRAMUSCULAR | Status: AC
Start: 2022-11-04 — End: 2022-11-04
  Administered 2022-11-04: 4 mg via INTRAVENOUS

## 2022-11-04 MED ORDER — CEFAZOLIN 1 GRAM SOLUTION FOR INJECTION
INTRAMUSCULAR | Status: AC
Start: 2022-11-04 — End: 2022-11-04
  Filled 2022-11-04: qty 20

## 2022-11-04 MED ORDER — SODIUM CHLORIDE 0.9 % INTRAVENOUS PIGGYBACK
INJECTION | INTRAVENOUS | Status: AC
Start: 2022-11-04 — End: 2022-11-04
  Filled 2022-11-04: qty 100

## 2022-11-04 MED ORDER — PHENAZOPYRIDINE 200 MG TABLET
200.0000 mg | ORAL_TABLET | Freq: Three times a day (TID) | ORAL | 0 refills | Status: DC | PRN
Start: 2022-11-04 — End: 2022-11-30

## 2022-11-04 MED ORDER — ONDANSETRON HCL (PF) 4 MG/2 ML INJECTION SOLUTION
4.0000 mg | Freq: Once | INTRAMUSCULAR | Status: DC | PRN
Start: 2022-11-04 — End: 2022-11-04
  Administered 2022-11-04: 4 mg via INTRAVENOUS

## 2022-11-04 MED ORDER — MIDAZOLAM 5 MG/ML INJECTION WRAPPER
1.5000 mg | Freq: Once | INTRAMUSCULAR | Status: DC | PRN
Start: 2022-11-04 — End: 2022-11-04
  Administered 2022-11-04: 1.5 mg via INTRAVENOUS

## 2022-11-04 MED ORDER — DEXAMETHASONE SODIUM PHOSPHATE 4 MG/ML INJECTION SOLUTION
INTRAMUSCULAR | Status: AC
Start: 2022-11-04 — End: 2022-11-04
  Filled 2022-11-04: qty 1

## 2022-11-04 SURGICAL SUPPLY — 43 items
ADH LIQUID LF  WTPRF VIAL PREP NONSTAIN MASTISOL STYRAX GUM MASTIC ALC MTHY SLCYT STRL CLR NHZR 2/3 (WOUND CARE SUPPLY) ×1 IMPLANT
BAG DRAIN 2000ML ANTIREFLUX TWR SLIDE TAP PORT BLUNT CANN LF (UROLOGICAL SUPPLIES) ×1 IMPLANT
BAG SUT DVN STRL LF (SUTURE/WOUND CLOSURE) ×1 IMPLANT
BANDAGE MDCHC 75X3IN STRL STRCH GAUZE NON ACUTE COMPRESS LTX DISP (WOUND CARE SUPPLY) ×1 IMPLANT
BLADE 10 2 END CBNSTL SURG STRL DISP (SURGICAL CUTTING SUPPLIES) ×1 IMPLANT
BLADE 15 2 END CBNSTL SURG STRL DISP (SURGICAL CUTTING SUPPLIES) ×1 IMPLANT
BLADE SURG CLPR W 37.2MM GP EXIST HNDL GTT IN CHRG .23MM NONST LF  DISP (MED SURG SUPPLIES) ×1 IMPLANT
CATH URETH BARDEX LUBRICATH 14FR STD FOLEY 2W DRAIN EYE MED OLV CDE NATURAL RUB DDRGL MALE 5CC STRL (UROLOGICAL SUPPLIES) ×1 IMPLANT
CATH URETH CLEAN 14FR 16IN UNIV INTMT SMOOTH TIP FNL END STGR EYE VNYL STRL LF  CLR (UROLOGICAL SUPPLIES) ×1 IMPLANT
CATH URETH DOVER 16FR FOLEY 2W LRG SMOOTH DRAIN EYE FIRM TIP SIL 10ML STRL LF  BLU STRP CLR (UROLOGICAL SUPPLIES) ×1 IMPLANT
CLEANER INSTRUMENT PRE-KLENZ 13.5 OZ (MISCELLANEOUS PT CARE ITEMS) ×1 IMPLANT
CONV USE 102436 - NEEDLE HYPO  22GA 1.5IN STD MONOJECT SS POLYPROP REG BVL LL HUB UL SHRP ANTICORE BLU STRL LF  DISP (MED SURG SUPPLIES) ×1 IMPLANT
CONV USE 123874 - SYRINGE AMSURE MDCHC 60CC LF  STRL TIP PRTC SM TUBE ADPR IRRG DISC BULB POLYPROP (MED SURG SUPPLIES) ×1 IMPLANT
CONV USE 31829 - NEEDLE HYPO  18GA 1.5IN STD REG BVL LF (MED SURG SUPPLIES) ×1 IMPLANT
CONV USE 80461 - NEEDLE HYPO  25GA 1.5IN STD REG BVL LF (MED SURG SUPPLIES) ×1 IMPLANT
CONV USE ITEM 156524 - ADHESIVE TISSUE EXOFIN 1.0ML_PREMIERPRO EXOFIN (SUTURE/WOUND CLOSURE) ×1 IMPLANT
COUNTER 20 CNT BLOCK ADH NEEDLE STRL LF  RD SHARP FOAM 15.75X11.5X14IN DISP (MED SURG SUPPLIES) ×1 IMPLANT
COVER TBL 90X50IN STD SMS REINF FNFLD STRL LF  DISP (DRAPE/PACKS/SHEETS/OR TOWEL) ×1 IMPLANT
DISCONTINUED NO SUB - DRESS TRNSPR 2.75X2.375IN FILM IV STRL LF (WOUND CARE SUPPLY) ×2 IMPLANT
DRESS TRNSPR 2.75X2.375IN FILM IV STRL LF (WOUND CARE/ENTEROSTOMAL SUPPLY) ×2
GLOVE SURG 6 LF  PF BEAD CUF STRL CRM 11.3IN PROTEXIS PLISPRN THK9.1 MIL (GLOVES AND ACCESSORIES) ×1 IMPLANT
GLOVE SURG 6.5 LF  PF BEAD CUF STRL CRM 11.3IN PROTEXIS PI PLISPRN THK9.1 MIL (GLOVES AND ACCESSORIES) ×2 IMPLANT
GLOVE SURG 7 LF  PF SMOOTH BEAD CUF INTLK STRL BLU 11.8IN PROTEXIS NEU-THERA PLISPRN THK7.9 MIL (GLOVES AND ACCESSORIES) ×1 IMPLANT
GLOVE SURG 7.5 LF  PF BEAD CUF STRL CRM 11.8IN PROTEXIS PI PLISPRN THK9.1 MIL (GLOVES AND ACCESSORIES) ×1 IMPLANT
GOWN SURG 2XL XLNG LGTH L3 HKLP CLSR RGLN SLEEVE TWL STRL LF  DISP GRN AERO BLU PRFRM FBRC (DRAPE/PACKS/SHEETS/OR TOWEL) ×1 IMPLANT
GOWN SURG XL STD LGTH L3 HKLP CLSR RGLN SLEEVE TWL STRL LF  DISP GRN AERO BLU PRFRM FBRC (DRAPE/PACKS/SHEETS/OR TOWEL) ×1 IMPLANT
JELLY LUB EZ BCTRST WATER SOL NGRS FLIPTOP TUBE STRL 4OZ LF (MED SURG SUPPLIES) ×1 IMPLANT
LABEL MED CORRECT MED LABELING SYS 4 FLG 2 SHEET 24 PRPRNT STRL (MED SURG SUPPLIES) ×1 IMPLANT
NEEDLE SPINAL YW 3.5IN 20GA QUINCKE REG WL POLYPROP STRL LF  DISP (MED SURG SUPPLIES) ×1 IMPLANT
PACK SURG SIRUS OB III REINF TBL CVR GRAD UNDERBUTTOCK STRL 76X44IN 46X33.5IN POLY LF (MED SURG SUPPLIES) ×1 IMPLANT
PEN SURG MRKNG DISP RLR LBL STRL LF  6IN (MED SURG SUPPLIES) ×1 IMPLANT
SET .241IN 77IN NONVENT PIERCE PIN LRG BORE TUBE SIGHT CHAMBER GRVTY FLOW IRRG CSCP BLADDER STRL LF (MED SURG SUPPLIES) ×1 IMPLANT
SOL IRRG 0.9% NACL 1000ML PLASTIC PR BTL ISTNC N-PYRG STRL LF (MEDICATIONS/SOLUTIONS) ×1 IMPLANT
SOL IV 0.9% NACL 500ML PLASTIC CONTAINR VIAFLEX LF (MEDICATIONS/SOLUTIONS) ×1 IMPLANT
SOL PREP 10% PVP IOD 4OZ ANSEP SCREW TOP BTL LIQUID LF  DRK BRN (MED SURG SUPPLIES) ×1 IMPLANT
SOL PREP BDINE SCRUB DISP (MED SURG SUPPLIES) ×1 IMPLANT
STRIP 4X.5IN STRSTRP PLSTR REINF SKNCLS WHT STRL LF (WOUND CARE SUPPLY) ×1 IMPLANT
SUTURE 3-0 SH VICRYL 27IN VIOL BRD COAT ABS (SUTURE/WOUND CLOSURE) ×1 IMPLANT
SYRINGE AMSURE MDCHC 60CC LF  STRL TIP PRTC SM TUBE ADPR IRRG DISC BULB POLYPROP (MED SURG SUPPLIES) ×1
SYRINGE LL 10ML LF  STRL GRAD N-PYRG DEHP-FR PVC FREE MED DISP (MED SURG SUPPLIES) ×2 IMPLANT
TOWEL 24X16IN COTTON BLU DISP SURG STRL LF (DRAPE/PACKS/SHEETS/OR TOWEL) ×1 IMPLANT
TUBING SUCT CLR 6FT .25IN ARGYLE PVC NCDTV STR MALE FEMALE MLD CONN STRL LF (MED SURG SUPPLIES) ×1 IMPLANT
URETH SUP ADV FIT DIL PSHR DEL DEV HNDL CURVE NEEDLE TIP POLYPROP SYSTEM TRNVG BLU (IMPLANTS GYNECOLOGIC) ×1 IMPLANT

## 2022-11-04 NOTE — Nurses Notes (Signed)
When patient was received from PACU to day surgery, patient was medicated for pain w/ meds ordered by surgeon. Patient states those meds would not help her pain.  Surgeon notified and new orders received and were given.    Family called out at time of this note and stated that patient is ready to go.  Ambulated to the bathroom w/ assist from sister and voided without difficulty.

## 2022-11-04 NOTE — Anesthesia Transfer of Care (Signed)
ANESTHESIA TRANSFER OF CARE   Caitlyn Gomez is a 47 y.o. ,female, Weight: (!) 151 kg (332 lb)   had Procedure(s):  CYSTOSCOPY; RETROPUBIC MIDURETHRAL SLING  performed  11/04/22   Primary Service: Arlana Hove, DO    Past Medical History:   Diagnosis Date    A-fib (CMS [facility])     Dr. Kathie Rhodes. [place]    Abnormal Pap smear     Anxiety     Arthropathy     Asthma     Chronic obstructive airway disease (CMS HCC)     Coronary artery disease     Depression     Depression 10/05/2021    Dysplasia of cervix     Dysrhythmias     a fib    Ectopic pregnancy     Fibromyalgia     GERD (gastroesophageal reflux disease)     controlled with med    History of anesthesia complications     aspirated for scope last time    History of cervical cancer     HTN (hypertension)     HTN (hypertension) 10/05/2021    Hyperlipidemia     NAFLD (nonalcoholic fatty liver disease)     Neck mass     PCOS (polycystic ovarian syndrome)     Smoking addiction     Stroke (CMS [facility])     TIA- [place] - 2016 - (left facial droop and left arm weakness)    Thyroid disorder     nodule    Thyroid nodule     Varicosities       Allergy History as of 11/04/22       SULFA (SULFONAMIDES)         Noted Status Severity Type Reaction    11/04/22 0704 Bostic, Mindi Junker, RN 01/24/10 Active Medium  Rash    01/24/10 0300 Haddix, Natalie K 01/24/10 Active                 AMOXICILLIN         Noted Status Severity Type Reaction    11/04/22 0704 BosticMindi Junker, RN 01/24/10 Active Medium  Rash    01/24/10 0301 Haddix, Carver Fila 01/24/10 Active                 PENICILLINS         Noted Status Severity Type Reaction    10/14/21 0840 Smalls, [place], LPN 16/10/96 Active High  Hives/ Urticaria, Rash    Comments: Other reaction(s): Rash, Skin Irritation  Rash and swellling                 MORPHINE         Noted Status Severity Type Reaction    10/14/21 0840 Smalls, [place], LPN 04/54/09 Active       Comments: Other reaction(s): ARM SWELLED UPON INJECTION, Rash               HYMENOPTERA ALLERGENIC  EXTRACT         Noted Status Severity Type Reaction    10/14/21 0840 Smalls, [place], LPN 81/19/14 Active High      Comments: Other reaction(s): UNKNOWN               BEE VENOM PROTEIN (HONEY BEE)         Noted Status Severity Type Reaction    11/04/22 0706 Tresa Endo, RN 11/04/22 Active High  Anaphylaxis                  I  completed my transfer of care / handoff to the receiving personnel during which we discussed:  Access, Airway, All key/critical aspects of case discussed, Analgesia, Antibiotics, Expectation of post procedure, Fluids/Product, Gave opportunity for questions and acknowledgement of understanding, Labs and PMHx      Post Location: PACU                                                             Last OR Temp: Temperature: 36.2 C (97.1 F)  ABG:  POTASSIUM   Date Value Ref Range Status   10/20/2022 4.2 3.5 - 5.1 mmol/L Final   01/24/2010 3.3 (L) 3.5 - 5.1 mmol/L Final     KETONES   Date Value Ref Range Status   10/20/2022 Negative Negative, Trace mg/dL Final     CALCIUM   Date Value Ref Range Status   10/20/2022 9.2 8.6 - 10.3 mg/dL Final   65/78/4696 8.6 8.5 - 10.4 mg/dL Final     Calculated P Axis   Date Value Ref Range Status   07/30/2022 58 degrees Final     Calculated R Axis   Date Value Ref Range Status   07/30/2022 2 degrees Final     Calculated T Axis   Date Value Ref Range Status   07/30/2022 14 degrees Final     Airway:* No LDAs found *  Blood pressure (!) 139/55, pulse 91, temperature 36.2 C (97.1 F), resp. rate 20, height 1.676 m ( ), weight (!) 151 kg (332 lb), last menstrual period 09/07/2009, SpO2 100%.

## 2022-11-04 NOTE — Anesthesia Preprocedure Evaluation (Addendum)
ANESTHESIA PRE-OP EVALUATION  Planned Procedure: CYSTOSCOPY; RETROPUBIC MIDURETHRAL SLING (Vagina )  Review of Systems     anesthesia history negative     patient summary reviewed  nursing notes reviewed        Pulmonary   COPD and asthma,   Cardiovascular    Hypertension, CAD, dysrhythmias, ECG reviewed, Per pt she was cleared by Dr Marney Doctor for surgery, atrial fibrillation and hyperlipidemia ,No peripheral edema,  Exercise Tolerance: > or = 4 METS        GI/Hepatic/Renal    GERD and liver disease        Endo/Other    morbid obesity,      Neuro/Psych/MS    CVA, fibromyalgia, anxiety, depression     Cancer  CA,                   Physical Assessment      Airway       Mallampati: III    TM distance: <3 FB    Mouth Opening: good.            Dental           (+) edentulous           Pulmonary    Breath sounds clear to auscultation  (-) no rhonchi, no decreased breath sounds, no wheezes, no rales and no stridor     Cardiovascular    Rhythm: regular  Rate: Normal  (-) no friction rub, carotid bruit is not present, no peripheral edema and no murmur     Other findings          Plan  ASA 3     Planned anesthesia type: general                         Intravenous induction     Anesthesia issues/risks discussed are: Dental Injuries, Stroke, Sore Throat and Cardiac Events/MI.  Anesthetic plan and risks discussed with patient           Patient's NPO status is appropriate for Anesthesia.

## 2022-11-04 NOTE — Discharge Instructions (Addendum)
Call the office with any questions or concerns that you may have.     Follow any instructions given to you by your doctor.     Drink plenty of fluids, preferably water.     Bleeding is normal after this procedure. If the bleeding continues longer than 1 week, call your doctor.      Resume home medication as instructed.    Stop by your pharmacy and pick up your prescription.
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
## 2022-11-04 NOTE — Anesthesia Postprocedure Evaluation (Signed)
Anesthesia Post Op Evaluation    Patient: Caitlyn Gomez  Procedure(s):  CYSTOSCOPY; RETROPUBIC MIDURETHRAL SLING    Last Vitals:Temperature: 36.3 C (97.3 F) (11/04/22 1020)  Heart Rate: 76 (11/04/22 1020)  BP (Non-Invasive): (!) 123/91 (11/04/22 1020)  Respiratory Rate: 18 (11/04/22 1020)  SpO2: 95 % (11/04/22 1020)    No notable events documented.    Patient is sufficiently recovered from the effects of anesthesia to participate in the evaluation and has returned to their pre-procedure level.  Patient location during evaluation: PACU       Patient participation: complete - patient participated  Level of consciousness: awake and alert and responsive to verbal stimuli    Pain management: adequate  Airway patency: patent    Anesthetic complications: no  Cardiovascular status: acceptable  Respiratory status: acceptable  Hydration status: acceptable  Patient post-procedure temperature: Pt Normothermic   PONV Status: Absent

## 2022-11-04 NOTE — OR Surgeon (Signed)
[facility]                                              OPERATIVE NOTE    Patient Name: Caitlyn Gomez, [facility] Number: [id number]  Date of Service: 11/04/2022   Date of Birth: 1976-01-07    All elements must be documented.    Pre-Operative Diagnosis:stress urinary incontinence   Post-Operative Diagnosis:same  Procedure(s)/Description:cystoscopy, retropubic mid urethral sling  Findings:  Severe stress incontinence    Patient was prepped and draped in the dorsal lithotomy position.  A weighted speculum was inserted into the vagina.  A 16 French Foley catheter was inserted into the urethra and 10 cc of sterile water was instilled in the balloon.  Very light traction was placed in the catheter to delineate the bladder neck.  At the mid urethral space a small mark was made.  The space here was infiltrated with 1% lidocaine with epinephrine.  From here a 2 cm midline incision was made in the mid urethral space and the vaginal tissue was dissected free of the urethra.  This was done bilaterally to the pubic arch.  After completing this the catheter was removed and a Mandarin was inserted to deviate the bladder to the opposite side the sling placement.  Using the Advantage fit trocar the sling was brought behind the pubic arch and up to the suprapubic space this was done bilaterally.  Hemostats were placed on the blue tips of the sling and a 70 degree cystoscope lens was inserted.  Systematic evaluation of the bladder did not reveal any perforation or injury with bilateral ureteral jets noted.  From here a large Tresa Endo was placed beneath the sling and the sling was tented up to provide mild traction/tension.  The redundant mesh was excised from the suprapubic region.  The vaginal mucosa was then closed in a running fashion using 3-0 Vicryl.  The patient tolerated the procedure well without untoward event      Attending Surgeon: Arlana Hove  Assistant(s): NA    Anesthesia Type:  General  Estimated Blood Loss:  Minimal  Blood Given: NA  Fluids Given: NS  Complications (unintended/unexpected/iatrogenic/accidental/inadvertent events):  NA  Characteristic Event (routinely expected or inherent to the difficulty/nature of the procedure): NA  Did the use of current and/or prior Anticoagulants impact the outcome of the case? No  Wound Class: Clean Contaminated Wounds -Respiratory, GI, Genital, or Urinary    Tubes: None  Drains: None  Specimens/ Cultures: NA  Implants: NA           Disposition: PACU - hemodynamically stable.  Condition: stable    Plan:  F/u in 2 weeks       Arlana Hove, DO
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
## 2022-11-19 ENCOUNTER — Encounter (INDEPENDENT_AMBULATORY_CARE_PROVIDER_SITE_OTHER): Payer: Self-pay | Admitting: Student in an Organized Health Care Education/Training Program

## 2022-11-19 ENCOUNTER — Ambulatory Visit (INDEPENDENT_AMBULATORY_CARE_PROVIDER_SITE_OTHER): Payer: Medicaid Other | Admitting: Student in an Organized Health Care Education/Training Program

## 2022-11-19 ENCOUNTER — Other Ambulatory Visit: Payer: Self-pay

## 2022-11-19 VITALS — BP 156/110 | HR 81 | Ht 66.0 in | Wt 328.0 lb

## 2022-11-19 DIAGNOSIS — N393 Stress incontinence (female) (male): Secondary | ICD-10-CM

## 2022-11-19 DIAGNOSIS — R339 Retention of urine, unspecified: Secondary | ICD-10-CM

## 2022-11-19 DIAGNOSIS — R3914 Feeling of incomplete bladder emptying: Secondary | ICD-10-CM

## 2022-11-30 ENCOUNTER — Other Ambulatory Visit: Payer: Self-pay

## 2022-11-30 ENCOUNTER — Emergency Department
Admission: EM | Admit: 2022-11-30 | Discharge: 2022-11-30 | Disposition: A | Discharge status: Home / Self Care | Payer: Medicaid Other | Attending: Emergency Medicine | Admitting: Emergency Medicine

## 2022-11-30 DIAGNOSIS — Z9889 Other specified postprocedural states: Secondary | ICD-10-CM | POA: Insufficient documentation

## 2022-11-30 DIAGNOSIS — R102 Pelvic and perineal pain: Secondary | ICD-10-CM

## 2022-11-30 DIAGNOSIS — N390 Urinary tract infection, site not specified: Secondary | ICD-10-CM | POA: Insufficient documentation

## 2022-11-30 LAB — CBC WITH DIFF
BASOPHIL #: 0.1 10*3/uL (ref 0.00–0.10)
BASOPHIL %: 1 % (ref 0–1)
EOSINOPHIL #: 0.1 10*3/uL (ref 0.00–0.50)
EOSINOPHIL %: 1 %
HCT: 40.9 % (ref 31.2–41.9)
HGB: 13.6 g/dL (ref 10.9–14.3)
LYMPHOCYTE #: 2.5 10*3/uL (ref 1.00–3.00)
LYMPHOCYTE %: 25 % (ref 16–44)
MCH: 28.9 pg (ref 24.7–32.8)
MCHC: 33.2 g/dL (ref 32.3–35.6)
MCV: 87.1 fL (ref 75.5–95.3)
MONOCYTE #: 0.8 10*3/uL (ref 0.30–1.00)
MONOCYTE %: 8 % (ref 5–13)
MPV: 9 fL (ref 7.9–10.8)
NEUTROPHIL #: 6.4 10*3/uL (ref 1.85–7.80)
NEUTROPHIL %: 65 % (ref 43–77)
PLATELETS: 261 10*3/uL (ref 140–440)
RBC: 4.7 10*6/uL (ref 3.63–4.92)
RDW: 13.8 % (ref 12.3–17.7)
WBC: 9.8 10*3/uL (ref 3.8–11.8)

## 2022-11-30 LAB — URINALYSIS, MACROSCOPIC
BILIRUBIN: NEGATIVE mg/dL
BLOOD: 0.06 mg/dL — AB
GLUCOSE: NEGATIVE mg/dL
KETONES: NEGATIVE mg/dL
LEUKOCYTES: 250 WBCs/uL — AB
NITRITE: NEGATIVE
PH: 6 (ref 5.0–9.0)
PROTEIN: 30 mg/dL — AB
SPECIFIC GRAVITY: 1.033 — ABNORMAL HIGH (ref 1.002–1.030)
UROBILINOGEN: 2 mg/dL — AB

## 2022-11-30 LAB — COMPREHENSIVE METABOLIC PANEL, NON-FASTING
ALBUMIN/GLOBULIN RATIO: 1.1 (ref 0.8–1.4)
ALBUMIN: 3.8 g/dL (ref 3.5–5.7)
ALKALINE PHOSPHATASE: 84 U/L (ref 34–104)
ALT (SGPT): 20 U/L (ref 7–52)
ANION GAP: 9 mmol/L (ref 4–13)
AST (SGOT): 18 U/L (ref 13–39)
BILIRUBIN TOTAL: 0.4 mg/dL (ref 0.3–1.2)
BUN/CREA RATIO: 14 (ref 6–22)
BUN: 12 mg/dL (ref 7–25)
CALCIUM, CORRECTED: 9 mg/dL (ref 8.9–10.8)
CALCIUM: 8.8 mg/dL (ref 8.6–10.3)
CHLORIDE: 105 mmol/L (ref 98–107)
CO2 TOTAL: 23 mmol/L (ref 21–31)
CREATININE: 0.86 mg/dL (ref 0.60–1.30)
ESTIMATED GFR: 85 mL/min/{1.73_m2} (ref >59)
GLOBULIN: 3.4 (ref 2.9–5.4)
GLUCOSE: 115 mg/dL — ABNORMAL HIGH (ref 74–109)
OSMOLALITY, CALCULATED: 275 mOsm/kg (ref 270–290)
POTASSIUM: 3.6 mmol/L (ref 3.5–5.1)
PROTEIN TOTAL: 7.2 g/dL (ref 6.4–8.9)
SODIUM: 137 mmol/L (ref 136–145)

## 2022-11-30 LAB — URINALYSIS, MICROSCOPIC
RBCS: 5 /hpf — ABNORMAL HIGH (ref <4)
SQUAMOUS EPITHELIAL: 48 /hpf — ABNORMAL HIGH (ref <28)
WBCS: 6 /hpf — ABNORMAL HIGH (ref <6)

## 2022-11-30 LAB — LACTIC ACID LEVEL W/ REFLEX FOR LEVEL >2.0: LACTIC ACID: 0.8 mmol/L (ref 0.5–2.2)

## 2022-11-30 LAB — C-REACTIVE PROTEIN(CRP),INFLAMMATION: C-REACTIVE PROTEIN (CRP): 3.3 mg/dL — ABNORMAL HIGH (ref 0.1–0.5)

## 2022-11-30 MED ORDER — PHENAZOPYRIDINE 200 MG TABLET
200.0000 mg | ORAL_TABLET | Freq: Three times a day (TID) | ORAL | 0 refills | Status: DC
Start: 2022-11-30 — End: 2023-03-16

## 2022-11-30 MED ORDER — CEFTRIAXONE 1 GRAM SOLUTION FOR INJECTION
INTRAMUSCULAR | Status: AC
Start: 2022-11-30 — End: 2022-11-30
  Filled 2022-11-30: qty 10

## 2022-11-30 MED ORDER — DICLOFENAC SODIUM 75 MG TABLET,DELAYED RELEASE
75.0000 mg | DELAYED_RELEASE_TABLET | Freq: Two times a day (BID) | ORAL | 0 refills | Status: DC
Start: 2022-11-30 — End: 2023-03-16

## 2022-11-30 MED ORDER — SODIUM CHLORIDE 0.9 % INTRAVENOUS PIGGYBACK
INJECTION | INTRAVENOUS | Status: AC
Start: 2022-11-30 — End: 2022-11-30
  Filled 2022-11-30: qty 50

## 2022-11-30 MED ORDER — CEFDINIR 300 MG CAPSULE
300.0000 mg | ORAL_CAPSULE | Freq: Two times a day (BID) | ORAL | 0 refills | Status: AC
Start: 2022-11-30 — End: 2022-12-10

## 2022-11-30 MED ORDER — KETOROLAC 30 MG/ML (1 ML) INJECTION SOLUTION
INTRAMUSCULAR | Status: AC
Start: 2022-11-30 — End: 2022-11-30
  Filled 2022-11-30: qty 1

## 2022-11-30 MED ORDER — KETOROLAC 60 MG/2 ML INTRAMUSCULAR SOLUTION
60.0000 mg | INTRAMUSCULAR | Status: AC
Start: 2022-11-30 — End: 2022-11-30
  Administered 2022-11-30: 60 mg via INTRAMUSCULAR

## 2022-11-30 MED ORDER — LIDOCAINE HCL 10 MG/ML (1 %) INJECTION SOLUTION
1.0000 g | INTRAMUSCULAR | Status: AC
Start: 2022-11-30 — End: 2022-11-30
  Administered 2022-11-30: 1 g via INTRAMUSCULAR

## 2022-11-30 NOTE — ED Triage Notes (Signed)
States had bladder sling placed on April the 17th. States that she is having increased pain at one of the incision sites. Denies drainage.

## 2022-11-30 NOTE — ED APP Handoff Note (Signed)
[facility]  Emergency Department  Provider in Triage Note    Name: Caitlyn Gomez  Age: 47 y.o.  Gender: female     Subjective:   Caitlyn Gomez is a 47 y.o. female who presents with complaint of Post-Op Problem and Abdominal Pain  .  Patient reports suprapubic abdominal pain status post sling bladder sling placed 1 month ago.  She denies any drainage from the wound site.  Denies dysuria.    Objective:   Filed Vitals:    11/30/22 1606   BP: (!) 128/101   Pulse: 81   Resp: 20   Temp: 37.1 C (98.7 F)   SpO2: 95%      Focused Physical Exam shows adult female upright alert in no apparent distress    Assessment:  A medical screening exam was completed.  This patient is a 47 y.o. female with initial findings showing suprapubic pain    Plan:  Please see initial orders and work-up below.  This is to be continued with full evaluation in the main Emergency Department.     No current facility-administered medications for this encounter.     Results for orders placed or performed during the hospital encounter of 11/30/22 (from the past 24 hour(s))   CBC/DIFF    Narrative    The following orders were created for panel order CBC/DIFF.  Procedure                               Abnormality         Status                     ---------                               -----------         ------                     CBC WITH ZOXW[[id number]]                                                                 Please view results for these tests on the individual orders.   URINALYSIS, MACROSCOPIC AND MICROSCOPIC W/CULTURE REFLEX    Specimen: Urine, Clean Catch    Narrative    The following orders were created for panel order URINALYSIS, MACROSCOPIC AND MICROSCOPIC W/CULTURE REFLEX.  Procedure                               Abnormality         Status                     ---------                               -----------         ------                     URINALYSIS, MACROSCOPIC[[id number]]  URINALYSIS, MICROSCOPIC[[id number]]                                                       Please view results for these tests on the individual orders.        Senaida Lange, PA-C  11/30/2022, 16:07

## 2022-11-30 NOTE — Discharge Instructions (Signed)
THE PATIENT IS TO FOLLOW UP WITH THE PRIMARY CARE PROVIDER AS SOON AS POSSIBLE BUT NO LATER THAN 3 DAYS FROM LEAVING THE EMERGENCY DEPARTMENT.  IF NO PRIMARY CARE PROVIDER EXISTS, THEN THE PATIENT IS INSTRUCTED TO ESTABLISH CARE WITH A PRIMARY CARE PROVIDER AS SOON AS POSSIBLE BUT NO LATER THAN 3 DAYS FROM LEAVING THE EMERGENCY DEPARTMENT.  FOLLOW-UP WITH ANY SPECIALIST PROVIDER AS INDICATED AS SOON AS POSSIBLE BUT NO LATER THAN 3 DAYS, IF APPLICABLE.  NOTIFY THE PRIMARY CARE PROVIDER THAT YOU WERE IN THE EMERGENCY DEPARTMENT WITHIN 24 HOURS OF DISCHARGE TO FOLLOW-UP ON YOUR RESULTS AND/OR TREATMENTS.  RETURN TO THE EMERGENCY DEPARTMENT IMMEDIATELY IF NEEDED, NO BETTER, WORSE, NEW SYMPTOMS ARISE, OR YOU CANNOT FOLLOW-UP WITH YOUR PRIMARY CARE PROVIDER AND/OR APPLICABLE SPECIALIST IN THE PRESCRIBED TIMEFRAME.

## 2022-11-30 NOTE — ED Provider Notes (Signed)
[facility]  Emergency Department  Attending Provider Note      CHIEF COMPLAINT  Chief Complaint   Patient presents with    Post-Op Problem    Abdominal Pain     HISTORY OF PRESENT ILLNESS  Caitlyn Gomez, date of birth 1976-04-23, is a 47 y.o. female who presented to the Emergency Department.    PATIENT PRESENTS EMERGENCY DEPARTMENT TODAY WITH A STATED COMPLAINT OF LOWER ABDOMINAL/SUPRAPUBIC DISCOMFORT FOR ABOUT 1 DAY AFTER HAVING HISTORY OF PREVIOUS BLADDER SLING REPAIR RECENTLY.  POSITIVE DYSURIA AND CHILLS.  NO VOMITING BUT OCCASIONAL NAUSEA.  NO CHEST PAIN PALPITATION SHORTNESS OF BREATH COUGH.  COMPREHENSIVE 10+ REVIEW OF SYSTEMS OTHERWISE NEGATIVE.  THERE ARE NO OTHER ACUTE COMPLAINTS REPORTED BY THE PATIENT.    PAST MEDICAL/SURGICAL/FAMILY/SOCIAL HISTORY  Past Medical History:   Diagnosis Date    A-fib (CMS HCC)     Dr. Kathie Rhodes. [facility]    Abnormal Pap smear     Anxiety     Arthropathy     Asthma     Chronic obstructive airway disease (CMS HCC)     Coronary artery disease     Depression     Depression 10/05/2021    Dysplasia of cervix     Dysrhythmias     a fib    Ectopic pregnancy     Fibromyalgia     GERD (gastroesophageal reflux disease)     controlled with med    History of anesthesia complications     aspirated for scope last time    History of cervical cancer     HTN (hypertension)     HTN (hypertension) 10/05/2021    Hyperlipidemia     NAFLD (nonalcoholic fatty liver disease)     Neck mass     PCOS (polycystic ovarian syndrome)     Smoking addiction     Stroke (CMS HCC)     TIA- [place] - 2016 - (left facial droop and left arm weakness)    Thyroid disorder     nodule    Thyroid nodule     Varicosities        Past Surgical History:   Procedure Laterality Date    CYSTOSCOPY  11/04/2022    cystoscopy, retropubic mid urethral sling    HX APPENDECTOMY      HX DILATION AND CURETTAGE      HX ENDOMETRIAL ABLATION      HX HEART CATHETERIZATION      stent    HX HYSTERECTOMY      HX TONSILLECTOMY       HX TUBAL LIGATION         Family Medical History:       Problem Relation (Age of Onset)    Colon Cancer Maternal Grandfather    Colon Polyps Maternal Grandmother    Diabetes Maternal Grandmother, Paternal Aunt    Healthy Mother    Heart Disease Father    Hypertension (High Blood Pressure) Father    Inflammatory Bowel Dz Maternal cousin    Stomach Cancer Maternal Aunt    Sudden Death no cause Other          Social History     Socioeconomic History    Marital status: Legally Separated    Number of children: 2   Occupational History    Occupation: Scientist, research (medical)   Tobacco Use    Smoking status: Every Day     Current packs/day: 1.00     Average packs/day: 1 pack/day  for 27.4 years (27.4 ttl pk-yrs)     Types: Cigarettes     Start date: 1997    Smokeless tobacco: Never   Vaping Use    Vaping status: Never Used   Substance and Sexual Activity    Alcohol use: Not Currently     Alcohol/week: 0.8 standard drinks of alcohol     Types: 1 Glasses of wine per week     Comment: 2-3 times a month;    Drug use: Never    Sexual activity: Not Currently     Partners: Male     Birth control/protection: Other   Other Topics Concern    Ability to Walk 1 Flight of Steps without SOB/CP Yes    Routine Exercise No    Ability to Walk 2 Flight of Steps without SOB/CP No     Comment: SOB    Ability To Do Own ADL's Yes   Social History Narrative    1 child deceased, 1 living      ALLERGIES  Allergies   Allergen Reactions    Bee Venom Protein (Honey Bee) Anaphylaxis    Hymenoptera Allergenic Extract      Other reaction(s): UNKNOWN    Penicillins Hives/ Urticaria and Rash     Other reaction(s): Rash, Skin Irritation  Rash and swellling      Amoxicillin Rash    Sulfa (Sulfonamides) Rash       PHYSICAL EXAM  VITAL SIGNS:  Filed Vitals:    11/30/22 1606   BP: (!) 128/101   Pulse: 81   Resp: 20   Temp: 37.1 C (98.7 F)   SpO2: 95%     GENERAL: PATIENT IS ALERT AND ORIENTED TO PERSON, PLACE, AND TIME.  HEAD: NORMOCEPHALIC AND ATRAUMATIC.  EYES:  PUPILS EQUALLY ROUND AND REACT TO LIGHT. EXTRAOCULAR MOVEMENTS INTACT.  EARS: GROSS HEARING INTACT. EXTERNAL EARS WITHIN NORMAL LIMITS.  NOSE: NO SEPTAL DEVIATION. NASAL PASSAGES CLEAR.  THROAT: MOIST ORAL MUCOSA.   NECK: SUPPLE. TRACHEA MIDLINE.  HEART: REGULAR, RATE, AND RHYTHM.  LUNGS:  DECREASED BILATERAL BREATH SOUNDS WITH OCCASIONAL FAINT SCATTERED WHEEZING.  ABDOMEN: SOFT, MINIMAL SUPRAPUBIC TENDERNESS TO PALPATION, NON-DISTENDED, AND BOWEL SOUNDS ARE PRESENT.  OVERWEIGHT.  GENITOURINARY: DEFERRED.  RECTAL: DEFERRED.  EXTREMITIES: NO CYANOSIS, CLUBBING, OR EDEMA.  SKIN: WARM AND DRY.  MUSCULOSKELETAL: DEFERRED.  NEUROLOGIC: CRANIAL NERVES II THROUGH XII ARE GROSSLY INTACT. SENSATION TO LIGHT TOUCH IS INTACT.  PSYCHIATRIC: JUDGMENT AND INSIGHT ARE SEEMINGLY INTACT. MOOD AND AFFECT ARE APPROPRIATE FOR THE SITUATION.      DIAGNOSTICS  Labs:  Labs listed below were reviewed and interpreted by me.  Results for orders placed or performed during the hospital encounter of 11/30/22   C-REACTIVE PROTEIN(CRP),INFLAMMATION   Result Value Ref Range    C-REACTIVE PROTEIN (CRP) 3.3 (H) 0.1 - 0.5 mg/dL   COMPREHENSIVE METABOLIC PANEL, NON-FASTING   Result Value Ref Range    SODIUM 137 136 - 145 mmol/L    POTASSIUM 3.6 3.5 - 5.1 mmol/L    CHLORIDE 105 98 - 107 mmol/L    CO2 TOTAL 23 21 - 31 mmol/L    ANION GAP 9 4 - 13 mmol/L    BUN 12 7 - 25 mg/dL    CREATININE 1.61 0.96 - 1.30 mg/dL    BUN/CREA RATIO 14 6 - 22    ESTIMATED GFR 85 >59 mL/min/1.40m^2    ALBUMIN 3.8 3.5 - 5.7 g/dL    CALCIUM 8.8 8.6 - 04.5 mg/dL    GLUCOSE  115 (H) 74 - 109 mg/dL    ALKALINE PHOSPHATASE 84 34 - 104 U/L    ALT (SGPT) 20 7 - 52 U/L    AST (SGOT) 18 13 - 39 U/L    BILIRUBIN TOTAL 0.4 0.3 - 1.2 mg/dL    PROTEIN TOTAL 7.2 6.4 - 8.9 g/dL    ALBUMIN/GLOBULIN RATIO 1.1 0.8 - 1.4    OSMOLALITY, CALCULATED 275 270 - 290 mOsm/kg    CALCIUM, CORRECTED 9.0 8.9 - 10.8 mg/dL    GLOBULIN 3.4 2.9 - 5.4   LACTIC ACID LEVEL W/ REFLEX FOR LEVEL >2.0   Result Value  Ref Range    LACTIC ACID 0.8 0.5 - 2.2 mmol/L   CBC WITH DIFF   Result Value Ref Range    WBC 9.8 3.8 - 11.8 x10^3/uL    RBC 4.70 3.63 - 4.92 x10^6/uL    HGB 13.6 10.9 - 14.3 g/dL    HCT 16.1 09.6 - 04.5 %    MCV 87.1 75.5 - 95.3 fL    MCH 28.9 24.7 - 32.8 pg    MCHC 33.2 32.3 - 35.6 g/dL    RDW 40.9 81.1 - 91.4 %    PLATELETS 261 140 - 440 x10^3/uL    MPV 9.0 7.9 - 10.8 fL    NEUTROPHIL % 65 43 - 77 %    LYMPHOCYTE % 25 16 - 44 %    MONOCYTE % 8 5 - 13 %    EOSINOPHIL % 1 %    BASOPHIL % 1 0 - 1 %    NEUTROPHIL # 6.40 1.85 - 7.80 x10^3/uL    LYMPHOCYTE # 2.50 1.00 - 3.00 x10^3/uL    MONOCYTE # 0.80 0.30 - 1.00 x10^3/uL    EOSINOPHIL # 0.10 0.00 - 0.50 x10^3/uL    BASOPHIL # 0.10 0.00 - 0.10 x10^3/uL   URINALYSIS, MACROSCOPIC   Result Value Ref Range    COLOR Yellow Colorless, Light Yellow, Yellow    APPEARANCE Turbid (A) Clear    SPECIFIC GRAVITY 1.033 (H) 1.002 - 1.030    PH 6.0 5.0 - 9.0    LEUKOCYTES 250 (A) Negative, 100  WBCs/uL    NITRITE Negative Negative    PROTEIN 30 (A) Negative, 10 , 20  mg/dL    GLUCOSE Negative Negative, 30  mg/dL    KETONES Negative Negative, Trace mg/dL    BILIRUBIN Negative Negative, 0.5 mg/dL    BLOOD 7.82 (A) Negative, 0.03 mg/dL    UROBILINOGEN 2 (A) Normal mg/dL   URINALYSIS, MICROSCOPIC   Result Value Ref Range    BACTERIA Rare (A) Negative /hpf    MUCOUS Many (A) (none) /hpf    RBCS 5 (H) <4 /hpf    WBCS 6 (H) <6 /hpf    SQUAMOUS EPITHELIAL 48 (H) <28 /hpf     Radiology:       ED COURSE/MEDICAL DECISION MAKING  Medications Administered in the ED   cefTRIAXone (ROCEPHIN) 1 g in lidocaine 2.86 mL (tot vol) IM injection (has no administration in time range)   ketorolac (TORADOL) 60mg /2 mL IM injection (has no administration in time range)      ED Course as of 11/30/22 2046   Mon Nov 30, 2022   2045 WBC: 9.8  NORMAL   2045 SODIUM: 137  NORMAL   2045 LACTIC ACID: 0.8  NORMAL   2045 LEUKOCYTES(!): 250  ABNORMAL      Medical Decision Making  Problems Addressed:  Acute urinary tract  infection: acute illness or injury  Suprapubic pain: acute illness or injury    Amount and/or Complexity of Data Reviewed  Labs: ordered. Decision-making details documented in ED Course.    Risk  Prescription drug management.  Diagnosis or treatment significantly limited by social determinants of health.      CLINICAL IMPRESSION  Clinical Impression   Acute urinary tract infection (Primary)   Suprapubic pain     DISPOSITION  Discharged       DISCHARGE MEDICATIONS  Current Discharge Medication List        START taking these medications    Details   cefdinir (OMNICEF) 300 mg Oral Capsule Take 1 Capsule (300 mg total) by mouth Twice daily for 10 days  Qty: 20 Capsule, Refills: 0      diclofenac sodium (VOLTAREN) 75 mg Oral Tablet, Delayed Release (E.C.) Take 1 Tablet (75 mg total) by mouth Twice daily  Qty: 30 Tablet, Refills: 0             /Pike Scantlebury A. Collins, DO, MBA   11/30/2022, 20:44   [facility]  Department of Emergency Medicine  [facility]    This note was partially generated using MModal Fluency Direct system, and there may be some incorrect words, spellings, and punctuation that were not noted in checking the note before saving.

## 2022-12-02 LAB — URINE CULTURE,ROUTINE: URINE CULTURE: 5000 — AB

## 2022-12-16 ENCOUNTER — Other Ambulatory Visit: Payer: Self-pay

## 2022-12-16 ENCOUNTER — Emergency Department (HOSPITAL_COMMUNITY): Payer: Medicaid Other

## 2022-12-16 ENCOUNTER — Emergency Department
Admission: EM | Admit: 2022-12-16 | Discharge: 2022-12-16 | Disposition: A | Discharge status: Home / Self Care | Payer: Medicaid Other | Attending: Emergency Medicine | Admitting: Emergency Medicine

## 2022-12-16 ENCOUNTER — Encounter (HOSPITAL_COMMUNITY): Payer: Self-pay | Admitting: Family

## 2022-12-16 DIAGNOSIS — R160 Hepatomegaly, not elsewhere classified: Secondary | ICD-10-CM | POA: Insufficient documentation

## 2022-12-16 DIAGNOSIS — R300 Dysuria: Secondary | ICD-10-CM | POA: Insufficient documentation

## 2022-12-16 DIAGNOSIS — K760 Fatty (change of) liver, not elsewhere classified: Secondary | ICD-10-CM | POA: Insufficient documentation

## 2022-12-16 DIAGNOSIS — K5792 Diverticulitis of intestine, part unspecified, without perforation or abscess without bleeding: Secondary | ICD-10-CM

## 2022-12-16 DIAGNOSIS — K5732 Diverticulitis of large intestine without perforation or abscess without bleeding: Secondary | ICD-10-CM | POA: Insufficient documentation

## 2022-12-16 LAB — COMPREHENSIVE METABOLIC PANEL, NON-FASTING
ALBUMIN/GLOBULIN RATIO: 1.1 (ref 0.8–1.4)
ALBUMIN: 3.9 g/dL (ref 3.5–5.7)
ALKALINE PHOSPHATASE: 83 U/L (ref 34–104)
ALT (SGPT): 28 U/L (ref 7–52)
ANION GAP: 5 mmol/L (ref 4–13)
AST (SGOT): 24 U/L (ref 13–39)
BILIRUBIN TOTAL: 0.4 mg/dL (ref 0.3–1.2)
BUN/CREA RATIO: 11 (ref 6–22)
BUN: 9 mg/dL (ref 7–25)
CALCIUM, CORRECTED: 9 mg/dL (ref 8.9–10.8)
CALCIUM: 8.9 mg/dL (ref 8.6–10.3)
CHLORIDE: 105 mmol/L (ref 98–107)
CO2 TOTAL: 29 mmol/L (ref 21–31)
CREATININE: 0.85 mg/dL (ref 0.60–1.30)
ESTIMATED GFR: 87 mL/min/{1.73_m2} (ref >59)
GLOBULIN: 3.7 (ref 2.9–5.4)
GLUCOSE: 99 mg/dL (ref 74–109)
OSMOLALITY, CALCULATED: 276 mOsm/kg (ref 270–290)
POTASSIUM: 3.7 mmol/L (ref 3.5–5.1)
PROTEIN TOTAL: 7.6 g/dL (ref 6.4–8.9)
SODIUM: 139 mmol/L (ref 136–145)

## 2022-12-16 LAB — URINALYSIS, MICROSCOPIC
RBCS: 2 /hpf (ref <4)
SQUAMOUS EPITHELIAL: 4 /hpf (ref <28)
WBCS: 2 /hpf (ref <6)

## 2022-12-16 LAB — CBC WITH DIFF
BASOPHIL #: 0.1 10*3/uL (ref 0.00–0.10)
BASOPHIL %: 1 % (ref 0–1)
EOSINOPHIL #: 0.1 10*3/uL (ref 0.00–0.50)
EOSINOPHIL %: 1 %
HCT: 42.3 % — ABNORMAL HIGH (ref 31.2–41.9)
HGB: 13.9 g/dL (ref 10.9–14.3)
LYMPHOCYTE #: 2.1 10*3/uL (ref 1.00–3.00)
LYMPHOCYTE %: 19 % (ref 16–44)
MCH: 28.5 pg (ref 24.7–32.8)
MCHC: 32.9 g/dL (ref 32.3–35.6)
MCV: 86.6 fL (ref 75.5–95.3)
MONOCYTE #: 0.9 10*3/uL (ref 0.30–1.00)
MONOCYTE %: 8 % (ref 5–13)
MPV: 9 fL (ref 7.9–10.8)
NEUTROPHIL #: 7.9 10*3/uL — ABNORMAL HIGH (ref 1.85–7.80)
NEUTROPHIL %: 71 % (ref 43–77)
PLATELETS: 333 10*3/uL (ref 140–440)
RBC: 4.89 10*6/uL (ref 3.63–4.92)
RDW: 13.4 % (ref 12.3–17.7)
WBC: 11.1 10*3/uL (ref 3.8–11.8)

## 2022-12-16 LAB — URINALYSIS, MACROSCOPIC
BILIRUBIN: NEGATIVE mg/dL
BLOOD: NEGATIVE mg/dL
GLUCOSE: NEGATIVE mg/dL
KETONES: NEGATIVE mg/dL
LEUKOCYTES: 25 WBCs/uL — AB
NITRITE: NEGATIVE
PH: 6.5 (ref 5.0–9.0)
PROTEIN: NEGATIVE mg/dL
SPECIFIC GRAVITY: 1.021 (ref 1.002–1.030)
UROBILINOGEN: NORMAL mg/dL

## 2022-12-16 LAB — C. DIFFICILE PCR
C. DIFFICILE TOXIN GENE, PCR: NEGATIVE
PRESUMPTIVE 027/NAP1/BI: NEGATIVE

## 2022-12-16 LAB — LACTIC ACID LEVEL W/ REFLEX FOR LEVEL >2.0: LACTIC ACID: 0.9 mmol/L (ref 0.5–2.2)

## 2022-12-16 MED ORDER — CIPROFLOXACIN 500 MG TABLET
500.0000 mg | ORAL_TABLET | ORAL | Status: AC
Start: 2022-12-16 — End: 2022-12-16
  Administered 2022-12-16: 500 mg via ORAL

## 2022-12-16 MED ORDER — METRONIDAZOLE 500 MG TABLET
500.0000 mg | ORAL_TABLET | Freq: Three times a day (TID) | ORAL | 0 refills | Status: DC
Start: 2022-12-16 — End: 2022-12-16

## 2022-12-16 MED ORDER — ONDANSETRON 4 MG DISINTEGRATING TABLET
4.0000 mg | ORAL_TABLET | Freq: Three times a day (TID) | ORAL | 0 refills | Status: DC | PRN
Start: 2022-12-16 — End: 2022-12-16

## 2022-12-16 MED ORDER — METRONIDAZOLE 500 MG/100 ML IN SODIUM CHLOR(ISO) INTRAVENOUS PIGGYBACK
500.0000 mg | INJECTION | INTRAVENOUS | Status: AC
Start: 2022-12-16 — End: 2022-12-16
  Administered 2022-12-16: 0 mg via INTRAVENOUS
  Administered 2022-12-16: 500 mg via INTRAVENOUS

## 2022-12-16 MED ORDER — CIPROFLOXACIN 500 MG TABLET
500.0000 mg | ORAL_TABLET | Freq: Two times a day (BID) | ORAL | 0 refills | Status: AC
Start: 2022-12-16 — End: 2022-12-26

## 2022-12-16 MED ORDER — CIPROFLOXACIN 500 MG TABLET
500.0000 mg | ORAL_TABLET | Freq: Two times a day (BID) | ORAL | 0 refills | Status: DC
Start: 2022-12-16 — End: 2022-12-16

## 2022-12-16 MED ORDER — ONDANSETRON 4 MG DISINTEGRATING TABLET
4.0000 mg | ORAL_TABLET | Freq: Three times a day (TID) | ORAL | 0 refills | Status: AC | PRN
Start: 2022-12-16 — End: ?

## 2022-12-16 MED ORDER — ACETAMINOPHEN 300 MG-CODEINE 30 MG TABLET
1.0000 | ORAL_TABLET | Freq: Four times a day (QID) | ORAL | 0 refills | Status: DC | PRN
Start: 2022-12-16 — End: 2022-12-16

## 2022-12-16 MED ORDER — ACETAMINOPHEN 300 MG-CODEINE 30 MG TABLET
1.0000 | ORAL_TABLET | Freq: Four times a day (QID) | ORAL | 0 refills | Status: AC | PRN
Start: 2022-12-16 — End: 2022-12-19

## 2022-12-16 MED ORDER — CIPROFLOXACIN 500 MG TABLET
ORAL_TABLET | ORAL | Status: AC
Start: 2022-12-16 — End: 2022-12-16
  Filled 2022-12-16: qty 1

## 2022-12-16 MED ORDER — SODIUM CHLORIDE 0.9 % IV BOLUS
1000.0000 mL | INJECTION | Status: AC
Start: 2022-12-16 — End: 2022-12-16
  Administered 2022-12-16: 1000 mL via INTRAVENOUS
  Administered 2022-12-16: 0 mL via INTRAVENOUS

## 2022-12-16 MED ORDER — METRONIDAZOLE 500 MG TABLET
500.0000 mg | ORAL_TABLET | Freq: Three times a day (TID) | ORAL | 0 refills | Status: AC
Start: 2022-12-16 — End: 2022-12-23

## 2022-12-16 MED ORDER — IOHEXOL 350 MG IODINE/ML INTRAVENOUS SOLUTION
75.0000 mL | INTRAVENOUS | Status: AC
Start: 2022-12-16 — End: 2022-12-16
  Administered 2022-12-16: 75 mL via INTRAVENOUS

## 2022-12-16 MED ORDER — METRONIDAZOLE 500 MG/100 ML IN SODIUM CHLOR(ISO) INTRAVENOUS PIGGYBACK
INJECTION | INTRAVENOUS | Status: AC
Start: 2022-12-16 — End: 2022-12-16
  Filled 2022-12-16: qty 100

## 2022-12-16 NOTE — ED Nurses Note (Signed)
DISCHARGE INSTRUCTIONS GIVEN, IV REMOVED, BLEEDING CONTROLLED, BANDAGE APPLIED. VERBALIZED UNDERSTANDING

## 2022-12-16 NOTE — ED Provider Notes (Signed)
[facility]  ED Primary Provider Note  History of Present Illness   Chief Complaint   Patient presents with    Urinary Pain     Arrival: The patient arrived by [facility] Caitlyn Gomez is a 47 y.o. female who had concerns including Urinary Pain.  47 year old female with recent bladder sling placement, history of diverticulitis, history of C diff presents to the emergency room for evaluation lower abdominal pain, burning with urination, and diarrhea.  She states she has had the burning with urination does have an appointment with Dr. Fredonia Highland tomorrow.  She states she was recently seen here and treated for urinary tract infection which improved but then returned shortly after coming off the antibiotics.  Patient states that she did develop diarrhea yesterday and has had approximately 8 episodes of watery diarrhea.  She does note some nausea but denies vomiting.  She states her temperature has been 99.8 F at home.  She has no other complaints this time.    History Reviewed This Encounter: Medical History  Surgical History  Family History  Social History    Physical Exam   ED Triage Vitals [12/16/22 1430]   BP (Non-Invasive) (!) 127/96   Heart Rate (!) 126   Respiratory Rate 20   Temp    SpO2 94 %   Weight (!) 151 kg (332 lb)   Height 1.707 m (5' 7.2")       Constitutional:  47 y.o. female who appears in no distress. Normal color, no cyanosis.   HENT:   Head: Normocephalic and atraumatic.   Mouth/Throat: Oropharynx is clear and moist.   Eyes: EOMI, PERRL   Neck: Trachea midline. Neck supple.  Cardiovascular: RRR, S1, S2 No murmurs, rubs or gallops. Intact distal pulses.  Pulmonary/Chest: BS clear and equal bilaterally.  Respirations even and nonlabored.  No respiratory distress. No wheezes, rales or chest tenderness.   Abdominal: Bowel sounds present and normal. Abdomen soft, no rebound and no guarding.  Tender to palpation in the left lower quadrant  Back: No midline spinal  tenderness, no paraspinal tenderness, no CVA tenderness.           Musculoskeletal: No edema, tenderness or deformity.  Skin: warm and dry. No rash, erythema, pallor or cyanosis  Psychiatric: normal mood and affect. Behavior is normal.   Neurological: Patient keenly alert and responsive, easily able to raise eyebrows, facial muscles/expressions symmetric, speaking in fluent sentences, moving all extremities equally and fully, normal gait  Patient Data     Labs Ordered/Reviewed   CBC WITH DIFF - Abnormal; Notable for the following components:       Result Value    HCT 42.3 (*)     NEUTROPHIL # 7.90 (*)     All other components within normal limits   URINALYSIS, MACROSCOPIC - Abnormal; Notable for the following components:    LEUKOCYTES 25 (*)     All other components within normal limits   URINALYSIS, MICROSCOPIC - Abnormal; Notable for the following components:    MUCOUS Rare (*)     All other components within normal limits   C. DIFFICILE PCR - Normal   COMPREHENSIVE METABOLIC PANEL, NON-FASTING - Normal    Narrative:     Estimated Glomerular Filtration Rate (eGFR) is calculated using the CKD-EPI (2021) equation, intended for patients 52 years of age and older. If gender is not documented or "unknown", there will be no eGFR calculation.     LACTIC ACID LEVEL  W/ REFLEX FOR LEVEL >2.0 - Normal   ADULT ROUTINE BLOOD CULTURE, SET OF 2 BOTTLES (BACTERIA AND YEAST)   ADULT ROUTINE BLOOD CULTURE, SET OF 2 BOTTLES (BACTERIA AND YEAST)   ROUTINE STOOL CULTURE (INCLUDING E. COLI SHIGA TOXIN)   CBC/DIFF    Narrative:     The following orders were created for panel order CBC/DIFF.  Procedure                               Abnormality         Status                     ---------                               -----------         ------                     CBC WITH ZOXW[[id number]]                Abnormal            Final result                 Please view results for these tests on the individual orders.   URINALYSIS, MACROSCOPIC  AND MICROSCOPIC W/CULTURE REFLEX    Narrative:     The following orders were created for panel order URINALYSIS, MACROSCOPIC AND MICROSCOPIC W/CULTURE REFLEX.  Procedure                               Abnormality         Status                     ---------                               -----------         ------                     URINALYSIS, MACROSCOPIC[[id number]]      Abnormal            Final result               URINALYSIS, MICROSCOPIC[[id number]]      Abnormal            Final result                 Please view results for these tests on the individual orders.     CT ABDOMEN PELVIS W IV CONTRAST   Final Result by Edi, Radresults In (05/29 1845)   Diverticulitis lower descending/upper sigmoid colon.   No bowel obstruction, perforation or abscess      Fatty liver with mild hepatomegaly          One or more dose reduction techniques were used (e.g., Automated exposure control, adjustment of the mA and/or kV according to patient size, use of iterative reconstruction technique).         Radiologist location ID: [id number]           Medical Decision Making        Medical Decision Making  Will discharge patient  home due to no significant/urgent findings requiring admission.  Vital signs are stable she is afebrile, initially when she came in she did have tachycardia which improved while here.  Patient's white blood cell count is normal, she is afebrile and vital signs are stable.  Patient has no convincing findings of urinary tract infection on labs.  She does have diverticulitis noted on CT scan with a history.  She has negative C diff. Her kidney function electrolytes are normal.  Patient will be placed on oral antibiotics outpatient, she was given Flagyl IV with Cipro here in the ER.  She was instructed to avoid alcohol with Flagyl due to causing sickness such as nausea vomiting.  We will place her on medication for nausea as well.  Patient to follow up closely with her primary care provider in 2-3 days for  recheck.  Patient was instructed return to the ER for any problems or worsening of symptoms including fever, bloody stool, increased pain.  Patient did verbalize understanding of instructions    Amount and/or Complexity of Data Reviewed  Labs: ordered. Decision-making details documented in ED Course.  Radiology: ordered. Decision-making details documented in ED Course.    Risk  Prescription drug management.    Critical Care  Total time providing critical care: 0 minutes              Results for orders placed or performed during the hospital encounter of 12/16/22 (from the past 12 hour(s))   URINALYSIS, MACROSCOPIC   Result Value Ref Range    COLOR Light Yellow Colorless, Light Yellow, Yellow    APPEARANCE Clear Clear    SPECIFIC GRAVITY 1.021 1.002 - 1.030    PH 6.5 5.0 - 9.0    LEUKOCYTES 25 (A) Negative, 100  WBCs/uL    NITRITE Negative Negative    PROTEIN Negative Negative, 10 , 20  mg/dL    GLUCOSE Negative Negative, 30  mg/dL    KETONES Negative Negative, Trace mg/dL    BILIRUBIN Negative Negative, 0.5 mg/dL    BLOOD Negative Negative, 0.03 mg/dL    UROBILINOGEN Normal Normal mg/dL   URINALYSIS, MICROSCOPIC   Result Value Ref Range    MUCOUS Rare (A) (none) /hpf    RBCS 2 <4 /hpf    WBCS 2 <6 /hpf    SQUAMOUS EPITHELIAL 4 <28 /hpf   COMPREHENSIVE METABOLIC PANEL, NON-FASTING   Result Value Ref Range    SODIUM 139 136 - 145 mmol/L    POTASSIUM 3.7 3.5 - 5.1 mmol/L    CHLORIDE 105 98 - 107 mmol/L    CO2 TOTAL 29 21 - 31 mmol/L    ANION GAP 5 4 - 13 mmol/L    BUN 9 7 - 25 mg/dL    CREATININE 1.61 0.96 - 1.30 mg/dL    BUN/CREA RATIO 11 6 - 22    ESTIMATED GFR 87 >59 mL/min/1.37m^2    ALBUMIN 3.9 3.5 - 5.7 g/dL    CALCIUM 8.9 8.6 - 04.5 mg/dL    GLUCOSE 99 74 - 409 mg/dL    ALKALINE PHOSPHATASE 83 34 - 104 U/L    ALT (SGPT) 28 7 - 52 U/L    AST (SGOT) 24 13 - 39 U/L    BILIRUBIN TOTAL 0.4 0.3 - 1.2 mg/dL    PROTEIN TOTAL 7.6 6.4 - 8.9 g/dL    ALBUMIN/GLOBULIN RATIO 1.1 0.8 - 1.4    OSMOLALITY, CALCULATED 276 270 -  290 mOsm/kg    CALCIUM, CORRECTED 9.0 8.9 -  10.8 mg/dL    GLOBULIN 3.7 2.9 - 5.4   LACTIC ACID LEVEL W/ REFLEX FOR LEVEL >2.0   Result Value Ref Range    LACTIC ACID 0.9 0.5 - 2.2 mmol/L   CBC WITH DIFF   Result Value Ref Range    WBC 11.1 3.8 - 11.8 x10^3/uL    RBC 4.89 3.63 - 4.92 x10^6/uL    HGB 13.9 10.9 - 14.3 g/dL    HCT 96.0 (H) 45.4 - 41.9 %    MCV 86.6 75.5 - 95.3 fL    MCH 28.5 24.7 - 32.8 pg    MCHC 32.9 32.3 - 35.6 g/dL    RDW 09.8 11.9 - 14.7 %    PLATELETS 333 140 - 440 x10^3/uL    MPV 9.0 7.9 - 10.8 fL    NEUTROPHIL % 71 43 - 77 %    LYMPHOCYTE % 19 16 - 44 %    MONOCYTE % 8 5 - 13 %    EOSINOPHIL % 1 %    BASOPHIL % 1 0 - 1 %    NEUTROPHIL # 7.90 (H) 1.85 - 7.80 x10^3/uL    LYMPHOCYTE # 2.10 1.00 - 3.00 x10^3/uL    MONOCYTE # 0.90 0.30 - 1.00 x10^3/uL    EOSINOPHIL # 0.10 0.00 - 0.50 x10^3/uL    BASOPHIL # 0.10 0.00 - 0.10 x10^3/uL   C. DIFFICILE PCR    Specimen: Stool   Result Value Ref Range    C. DIFFICILE TOXIN GENE, PCR Negative Negative    PRESUMPTIVE 027/NAP1/BI Negative Not Detected, Negative       Medications Administered in the ED   NS bolus infusion 1,000 mL (1,000 mL Intravenous New Bag/New Syringe 12/16/22 1921)   metroNIDAZOLE (FLAGYL) 500 mg in NS 100 mL premix IVPB (500 mg Intravenous New Bag/New Syringe 12/16/22 1921)   iohexol (OMNIPAQUE 350) infusion (75 mL Intravenous Given 12/16/22 1812)   ciprofloxacin (CIPRO) tablet (500 mg Oral Given 12/16/22 1921)     Clinical Impression   Diverticulitis (Primary)   Dysuria       Disposition: Discharged  Current Discharge Medication List        START taking these medications    Details   acetaminophen-codeine (TYLENOL #3) 300-30 mg Oral Tablet Take 1 Tablet by mouth Every 6 hours as needed for up to 3 days  Qty: 12 Tablet, Refills: 0      ciprofloxacin HCl (CIPRO) 500 mg Oral Tablet Take 1 Tablet (500 mg total) by mouth Twice daily for 10 days  Qty: 20 Tablet, Refills: 0      metroNIDAZOLE (FLAGYL) 500 mg Oral Tablet Take 1 Tablet (500 mg total)  by mouth Three times a day for 7 days  Qty: 21 Tablet, Refills: 0

## 2022-12-16 NOTE — ED Nurses Note (Signed)
Post void bladder scan performed on patient- 25 ml of urine. Provider made aware.

## 2022-12-16 NOTE — ED Triage Notes (Signed)
Recently had bladder  sling . States having problem emptying and pain with urination .Marland Kitchen

## 2022-12-16 NOTE — ED APP Handoff Note (Signed)
[facility]  Emergency Department  Provider in Triage Note    Name: Caitlyn Gomez  Age: 47 y.o.  Gender: female     Subjective:   Caitlyn Gomez is a 47 y.o. female who presents with complaint of Urinary Pain  .  Patient reports recent Hx of UTI. Symptoms started again yesterday, but worse this time. She reports fever and chills that started last night with burning on urination. Patient reports left flank pain.     Objective:   Filed Vitals:    12/16/22 1430   BP: (!) 127/96   Pulse: (!) 126   Resp: 20   SpO2: 94%      Focused Physical Exam shows alert, upright, appeared uncomfortable and fatigued.    Assessment:  A medical screening exam was completed.  This patient is a 47 y.o. female with initial findings showing UTI, possible urinary retention.    Plan:  Please see initial orders and work-up below.  This is to be continued with full evaluation in the main Emergency Department.     No current facility-administered medications for this encounter.     Results for orders placed or performed during the hospital encounter of 12/16/22 (from the past 24 hour(s))   CBC/DIFF    Narrative    The following orders were created for panel order CBC/DIFF.  Procedure                               Abnormality         Status                     ---------                               -----------         ------                     CBC WITH ZOXW[[id number]]                                                                 Please view results for these tests on the individual orders.   URINALYSIS, MACROSCOPIC AND MICROSCOPIC W/CULTURE REFLEX    Specimen: Urine, Clean Catch    Narrative    The following orders were created for panel order URINALYSIS, MACROSCOPIC AND MICROSCOPIC W/CULTURE REFLEX.  Procedure                               Abnormality         Status                     ---------                               -----------         ------                     URINALYSIS,  MACROSCOPIC[[id number]]  URINALYSIS, MICROSCOPIC[[id number]]                                                       Please view results for these tests on the individual orders.        Christin Fudge, APRN  12/16/2022, 14:29

## 2022-12-16 NOTE — Discharge Instructions (Signed)
FOLLOW UP WITH THE PRIMARY CARE PROVIDER AS SOON AS POSSIBLE BUT NO LATER THAN 3 DAYS NOW.  IF NO PRIMARY CARE PROVIDER EXISTS, THEN THE PATIENT IS INSTRUCTED TO ESTABLISH CARE WITH A PRIMARY CARE PROVIDER. FOLLOW-UP WITH ANY SPECIALIST PROVIDER AS INDICATED AS SOON AS POSSIBLE BUT NO LATER THAN 3 DAYS.  NOTIFY THE PRIMARY CARE PROVIDER THAT YOU WERE IN THE EMERGENCY DEPARTMENT WITHIN 24 HOURS OF DISCHARGE TO FOLLOW-UP ON YOUR RESULTS AND/OR TREATMENTS.  RETURN TO THE EMERGENCY DEPARTMENT IMMEDIATELY IF NEEDED, NO BETTER, WORSE, NEW SYMPTOMS ARISE, OR YOU CANNOT FOLLOW-UP WITH YOUR PRIMARY CARE PROVIDER IN THE PRESCRIBED TIMEFRAME.

## 2022-12-17 ENCOUNTER — Encounter (INDEPENDENT_AMBULATORY_CARE_PROVIDER_SITE_OTHER): Payer: Self-pay | Admitting: Student in an Organized Health Care Education/Training Program

## 2022-12-19 LAB — E. COLI SHIGA TOXIN
SHIGA TOXIN 1: NEGATIVE
SHIGA TOXIN 2: NEGATIVE

## 2022-12-21 LAB — ROUTINE STOOL CULTURE (INCLUDING E. COLI SHIGA TOXIN): STOOL CULTURE: NORMAL

## 2022-12-21 LAB — ADULT ROUTINE BLOOD CULTURE, SET OF 2 BOTTLES (BACTERIA AND YEAST)
BLOOD CULTURE, ROUTINE: NO GROWTH
BLOOD CULTURE, ROUTINE: NO GROWTH

## 2023-01-18 ENCOUNTER — Ambulatory Visit (INDEPENDENT_AMBULATORY_CARE_PROVIDER_SITE_OTHER): Payer: Self-pay | Admitting: Neurological Surgery

## 2023-01-25 ENCOUNTER — Encounter (INDEPENDENT_AMBULATORY_CARE_PROVIDER_SITE_OTHER): Payer: Self-pay | Admitting: Neurological Surgery

## 2023-01-25 ENCOUNTER — Other Ambulatory Visit: Payer: Self-pay

## 2023-01-25 ENCOUNTER — Ambulatory Visit: Payer: Medicaid Other | Attending: Neurological Surgery | Admitting: Neurological Surgery

## 2023-01-25 VITALS — BP 129/73 | HR 72 | Temp 96.2°F | Ht 66.0 in | Wt 325.4 lb

## 2023-01-25 DIAGNOSIS — D333 Benign neoplasm of cranial nerves: Secondary | ICD-10-CM | POA: Insufficient documentation

## 2023-01-25 DIAGNOSIS — M79602 Pain in left arm: Secondary | ICD-10-CM | POA: Insufficient documentation

## 2023-01-25 DIAGNOSIS — G935 Compression of brain: Secondary | ICD-10-CM | POA: Insufficient documentation

## 2023-01-25 DIAGNOSIS — H903 Sensorineural hearing loss, bilateral: Secondary | ICD-10-CM | POA: Insufficient documentation

## 2023-01-25 DIAGNOSIS — M542 Cervicalgia: Secondary | ICD-10-CM | POA: Insufficient documentation

## 2023-01-25 DIAGNOSIS — M25512 Pain in left shoulder: Secondary | ICD-10-CM | POA: Insufficient documentation
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
## 2023-02-25 ENCOUNTER — Encounter (INDEPENDENT_AMBULATORY_CARE_PROVIDER_SITE_OTHER): Payer: Self-pay | Admitting: Student in an Organized Health Care Education/Training Program

## 2023-03-16 ENCOUNTER — Observation Stay
Admission: EM | Admit: 2023-03-16 | Discharge: 2023-03-17 | Disposition: A | Discharge status: Home / Self Care | Payer: Medicaid Other | Attending: Internal Medicine | Admitting: Internal Medicine

## 2023-03-16 ENCOUNTER — Emergency Department (HOSPITAL_COMMUNITY): Payer: Medicaid Other

## 2023-03-16 ENCOUNTER — Encounter (HOSPITAL_COMMUNITY): Payer: Self-pay | Admitting: NURSE PRACTITIONER

## 2023-03-16 ENCOUNTER — Other Ambulatory Visit: Payer: Self-pay

## 2023-03-16 DIAGNOSIS — R109 Unspecified abdominal pain: Secondary | ICD-10-CM

## 2023-03-16 DIAGNOSIS — I10 Essential (primary) hypertension: Secondary | ICD-10-CM | POA: Insufficient documentation

## 2023-03-16 DIAGNOSIS — F17210 Nicotine dependence, cigarettes, uncomplicated: Secondary | ICD-10-CM | POA: Insufficient documentation

## 2023-03-16 DIAGNOSIS — J449 Chronic obstructive pulmonary disease, unspecified: Secondary | ICD-10-CM

## 2023-03-16 DIAGNOSIS — K5792 Diverticulitis of intestine, part unspecified, without perforation or abscess without bleeding: Secondary | ICD-10-CM

## 2023-03-16 DIAGNOSIS — K760 Fatty (change of) liver, not elsewhere classified: Secondary | ICD-10-CM | POA: Insufficient documentation

## 2023-03-16 DIAGNOSIS — F419 Anxiety disorder, unspecified: Secondary | ICD-10-CM | POA: Insufficient documentation

## 2023-03-16 DIAGNOSIS — D72829 Elevated white blood cell count, unspecified: Secondary | ICD-10-CM

## 2023-03-16 DIAGNOSIS — J45909 Unspecified asthma, uncomplicated: Secondary | ICD-10-CM

## 2023-03-16 DIAGNOSIS — I2510 Atherosclerotic heart disease of native coronary artery without angina pectoris: Secondary | ICD-10-CM | POA: Insufficient documentation

## 2023-03-16 DIAGNOSIS — I4891 Unspecified atrial fibrillation: Secondary | ICD-10-CM | POA: Insufficient documentation

## 2023-03-16 DIAGNOSIS — K5733 Diverticulitis of large intestine without perforation or abscess with bleeding: Principal | ICD-10-CM | POA: Insufficient documentation

## 2023-03-16 DIAGNOSIS — E785 Hyperlipidemia, unspecified: Secondary | ICD-10-CM | POA: Insufficient documentation

## 2023-03-16 DIAGNOSIS — Z6841 Body Mass Index (BMI) 40.0 and over, adult: Secondary | ICD-10-CM | POA: Insufficient documentation

## 2023-03-16 DIAGNOSIS — K219 Gastro-esophageal reflux disease without esophagitis: Secondary | ICD-10-CM | POA: Insufficient documentation

## 2023-03-16 DIAGNOSIS — Z79899 Other long term (current) drug therapy: Secondary | ICD-10-CM | POA: Insufficient documentation

## 2023-03-16 DIAGNOSIS — F32A Depression, unspecified: Secondary | ICD-10-CM | POA: Insufficient documentation

## 2023-03-16 DIAGNOSIS — R509 Fever, unspecified: Secondary | ICD-10-CM

## 2023-03-16 HISTORY — DX: Radiculopathy, site unspecified: M54.10

## 2023-03-16 HISTORY — DX: Malignant (primary) neoplasm, unspecified: C80.1

## 2023-03-16 HISTORY — DX: Disease of spinal cord, unspecified: G95.9

## 2023-03-16 HISTORY — DX: Complex regional pain syndrome I, unspecified: G90.50

## 2023-03-16 HISTORY — DX: Benign neoplasm of brain, unspecified: D33.2

## 2023-03-16 LAB — URINALYSIS, MACROSCOPIC
BILIRUBIN: NEGATIVE mg/dL
BLOOD: 0.03 mg/dL
GLUCOSE: NEGATIVE mg/dL
LEUKOCYTES: 75 WBCs/uL — AB
NITRITE: NEGATIVE
PH: 6 (ref 5.0–9.0)
PROTEIN: 10 mg/dL
SPECIFIC GRAVITY: 1.025 (ref 1.002–1.030)
UROBILINOGEN: 2 mg/dL — AB

## 2023-03-16 LAB — COMPREHENSIVE METABOLIC PANEL, NON-FASTING
ALBUMIN/GLOBULIN RATIO: 1 (ref 0.8–1.4)
ALBUMIN: 3.8 g/dL (ref 3.5–5.7)
ALKALINE PHOSPHATASE: 64 U/L (ref 34–104)
ALT (SGPT): 15 U/L (ref 7–52)
ANION GAP: 7 mmol/L (ref 4–13)
AST (SGOT): 14 U/L (ref 13–39)
BILIRUBIN TOTAL: 0.6 mg/dL (ref 0.3–1.0)
BUN/CREA RATIO: 14 (ref 6–22)
BUN: 11 mg/dL (ref 7–25)
CALCIUM, CORRECTED: 8.9 mg/dL (ref 8.9–10.8)
CALCIUM: 8.7 mg/dL (ref 8.6–10.3)
CHLORIDE: 105 mmol/L (ref 98–107)
CO2 TOTAL: 24 mmol/L (ref 21–31)
CREATININE: 0.81 mg/dL (ref 0.60–1.30)
ESTIMATED GFR: 92 mL/min/{1.73_m2} (ref >59)
GLOBULIN: 3.7 (ref 2.9–5.4)
GLUCOSE: 85 mg/dL (ref 74–109)
OSMOLALITY, CALCULATED: 271 mOsm/kg (ref 270–290)
POTASSIUM: 3.7 mmol/L (ref 3.5–5.1)
PROTEIN TOTAL: 7.5 g/dL (ref 6.4–8.9)
SODIUM: 136 mmol/L (ref 136–145)

## 2023-03-16 LAB — URINALYSIS, MICROSCOPIC
NON-SQUAMOUS EPITHELIAL CELLS URINE: <1 /hpf (ref <=1)
RBCS: 3 /hpf (ref <4)
SQUAMOUS EPITHELIAL: 17 /hpf (ref <28)
WBCS: 3 /hpf (ref <6)

## 2023-03-16 LAB — CBC WITH DIFF
BASOPHIL #: 0.1 10*3/uL (ref 0.00–0.10)
BASOPHIL %: 1 % (ref 0–1)
EOSINOPHIL #: 0.1 10*3/uL (ref 0.00–0.50)
EOSINOPHIL %: 1 % (ref 1–7)
HCT: 40 % (ref 31.2–41.9)
HGB: 13.1 g/dL (ref 10.9–14.3)
LYMPHOCYTE #: 1.7 10*3/uL (ref 1.00–3.00)
LYMPHOCYTE %: 15 % — ABNORMAL LOW (ref 16–44)
MCH: 28.8 pg (ref 24.7–32.8)
MCHC: 32.6 g/dL (ref 32.3–35.6)
MCV: 88.1 fL (ref 75.5–95.3)
MONOCYTE #: 1.2 10*3/uL — ABNORMAL HIGH (ref 0.30–1.00)
MONOCYTE %: 11 % (ref 5–13)
MPV: 9.4 fL (ref 7.9–10.8)
NEUTROPHIL #: 8.5 10*3/uL — ABNORMAL HIGH (ref 1.85–7.80)
NEUTROPHIL %: 73 % (ref 43–77)
PLATELETS: 251 10*3/uL (ref 140–440)
RBC: 4.54 10*6/uL (ref 3.63–4.92)
RDW: 13.9 % (ref 12.3–17.7)
WBC: 11.7 10*3/uL (ref 3.8–11.8)

## 2023-03-16 LAB — LIPASE: LIPASE: 29 U/L (ref 11–82)

## 2023-03-16 LAB — LACTIC ACID LEVEL W/ REFLEX FOR LEVEL >2.0: LACTIC ACID: 0.9 mmol/L (ref 0.5–2.2)

## 2023-03-16 MED ORDER — METHYLPREDNISOLONE SOD SUCC 125 MG SOLUTION FOR INJECTION WRAPPER
125.0000 mg | INTRAVENOUS | Status: AC
Start: 2023-03-16 — End: 2023-03-16
  Administered 2023-03-16: 125 mg via INTRAVENOUS

## 2023-03-16 MED ORDER — MORPHINE 2 MG/ML INJECTION WRAPPER
2.0000 mg | INJECTION | INTRAMUSCULAR | Status: AC | PRN
Start: 2023-03-16 — End: ?
  Administered 2023-03-16: 2 mg via INTRAVENOUS

## 2023-03-16 MED ORDER — METRONIDAZOLE 500 MG/100 ML IN SODIUM CHLOR(ISO) INTRAVENOUS PIGGYBACK
500.0000 mg | INJECTION | Freq: Three times a day (TID) | INTRAVENOUS | Status: AC
Start: 2023-03-17 — End: ?
  Administered 2023-03-17: 500 mg via INTRAVENOUS
  Administered 2023-03-17: 0 mg via INTRAVENOUS

## 2023-03-16 MED ORDER — ACETAMINOPHEN 325 MG TABLET
650.0000 mg | ORAL_TABLET | ORAL | Status: DC | PRN
Start: 2023-03-16 — End: 2023-03-17

## 2023-03-16 MED ORDER — CIPROFLOXACIN 500 MG TABLET
ORAL_TABLET | ORAL | Status: AC
Start: 2023-03-16 — End: 2023-03-16
  Filled 2023-03-16: qty 1

## 2023-03-16 MED ORDER — CIPROFLOXACIN 500 MG TABLET
500.0000 mg | ORAL_TABLET | ORAL | Status: AC
Start: 2023-03-16 — End: 2023-03-16
  Administered 2023-03-16: 500 mg via ORAL

## 2023-03-16 MED ORDER — IOHEXOL 350 MG IODINE/ML INTRAVENOUS SOLUTION
100.0000 mL | INTRAVENOUS | Status: AC
Start: 2023-03-16 — End: 2023-03-16
  Administered 2023-03-16: 75 mL via INTRAVENOUS

## 2023-03-16 MED ORDER — ACETAMINOPHEN 325 MG TABLET
ORAL_TABLET | ORAL | Status: AC
Start: 2023-03-16 — End: 2023-03-16
  Filled 2023-03-16: qty 2

## 2023-03-16 MED ORDER — LEVOFLOXACIN 750 MG/150 ML IN 5 % DEXTROSE INTRAVENOUS PIGGYBACK
750.0000 mg | INJECTION | INTRAVENOUS | Status: DC
Start: 2023-03-17 — End: 2023-03-17
  Administered 2023-03-16: 750 mg via INTRAVENOUS
  Administered 2023-03-17: 0 mg via INTRAVENOUS

## 2023-03-16 MED ORDER — SODIUM CHLORIDE 0.9 % INTRAVENOUS SOLUTION
INTRAVENOUS | Status: DC
Start: 2023-03-17 — End: 2023-03-17

## 2023-03-16 MED ORDER — METRONIDAZOLE 500 MG/100 ML IN SODIUM CHLOR(ISO) INTRAVENOUS PIGGYBACK
INJECTION | INTRAVENOUS | Status: AC
Start: 2023-03-16 — End: 2023-03-16
  Filled 2023-03-16: qty 100

## 2023-03-16 MED ORDER — BUDESONIDE-FORMOTEROL HFA 80 MCG-4.5 MCG/ACTUATION AEROSOL INHALER
2.0000 | INHALATION_SPRAY | Freq: Two times a day (BID) | RESPIRATORY_TRACT | Status: DC
Start: 2023-03-16 — End: 2023-03-17
  Administered 2023-03-16: 0 via RESPIRATORY_TRACT
  Administered 2023-03-17: 2 via RESPIRATORY_TRACT
  Filled 2023-03-16: qty 13.8

## 2023-03-16 MED ORDER — ONDANSETRON HCL (PF) 4 MG/2 ML INJECTION SOLUTION
4.0000 mg | INTRAMUSCULAR | Status: AC
Start: 2023-03-16 — End: 2023-03-16
  Administered 2023-03-16: 4 mg via INTRAVENOUS

## 2023-03-16 MED ORDER — MORPHINE 2 MG/ML INJECTION WRAPPER
INJECTION | INTRAMUSCULAR | Status: AC
Start: 2023-03-16 — End: 2023-03-16
  Filled 2023-03-16: qty 1

## 2023-03-16 MED ORDER — APIXABAN 5 MG TABLET
5.0000 mg | ORAL_TABLET | Freq: Two times a day (BID) | ORAL | Status: DC
Start: 2023-03-17 — End: 2023-03-17
  Administered 2023-03-17: 5 mg via ORAL

## 2023-03-16 MED ORDER — TRAMADOL 50 MG TABLET
50.0000 mg | ORAL_TABLET | Freq: Four times a day (QID) | ORAL | Status: AC | PRN
Start: 2023-03-16 — End: ?

## 2023-03-16 MED ORDER — KETOROLAC 30 MG/ML (1 ML) INJECTION SOLUTION
INTRAMUSCULAR | Status: AC
Start: 2023-03-16 — End: 2023-03-16
  Filled 2023-03-16: qty 1

## 2023-03-16 MED ORDER — ONDANSETRON HCL (PF) 4 MG/2 ML INJECTION SOLUTION
INTRAMUSCULAR | Status: AC
Start: 2023-03-16 — End: 2023-03-16
  Filled 2023-03-16: qty 2

## 2023-03-16 MED ORDER — ACETAMINOPHEN 325 MG TABLET
650.0000 mg | ORAL_TABLET | ORAL | Status: AC
Start: 2023-03-16 — End: 2023-03-16
  Administered 2023-03-16: 650 mg via ORAL

## 2023-03-16 MED ORDER — ONDANSETRON HCL (PF) 4 MG/2 ML INJECTION SOLUTION
4.0000 mg | Freq: Four times a day (QID) | INTRAMUSCULAR | Status: DC | PRN
Start: 2023-03-17 — End: 2023-03-17

## 2023-03-16 MED ORDER — METOPROLOL SUCCINATE ER 25 MG TABLET,EXTENDED RELEASE 24 HR
50.0000 mg | ORAL_TABLET | Freq: Every day | ORAL | Status: DC
Start: 2023-03-17 — End: 2023-03-17
  Administered 2023-03-17: 50 mg via ORAL

## 2023-03-16 MED ORDER — KETOROLAC 30 MG/ML (1 ML) INJECTION SOLUTION
30.0000 mg | INTRAMUSCULAR | Status: AC
Start: 2023-03-16 — End: 2023-03-16
  Administered 2023-03-16: 30 mg via INTRAVENOUS

## 2023-03-16 MED ORDER — METHYLPREDNISOLONE SOD SUCC 125 MG SOLUTION FOR INJECTION WRAPPER
INTRAVENOUS | Status: AC
Start: 2023-03-16 — End: 2023-03-16
  Filled 2023-03-16: qty 2

## 2023-03-16 MED ORDER — ALBUTEROL SULFATE 2.5 MG/3 ML (0.083 %) SOLUTION FOR NEBULIZATION
2.5000 mg | INHALATION_SOLUTION | RESPIRATORY_TRACT | Status: AC | PRN
Start: 2023-03-16 — End: ?

## 2023-03-16 MED ORDER — LEVOFLOXACIN 750 MG/150 ML IN 5 % DEXTROSE INTRAVENOUS PIGGYBACK
INJECTION | INTRAVENOUS | Status: AC
Start: 2023-03-16 — End: 2023-03-16
  Filled 2023-03-16: qty 150

## 2023-03-16 MED ORDER — METRONIDAZOLE 250 MG TABLET
500.0000 mg | ORAL_TABLET | Freq: Two times a day (BID) | ORAL | Status: DC
Start: 2023-03-16 — End: 2023-03-16

## 2023-03-16 MED ORDER — METRONIDAZOLE 500 MG/100 ML IN SODIUM CHLOR(ISO) INTRAVENOUS PIGGYBACK
500.0000 mg | INJECTION | INTRAVENOUS | Status: AC
Start: 2023-03-16 — End: 2023-03-16
  Administered 2023-03-16: 0 mg via INTRAVENOUS
  Administered 2023-03-16: 500 mg via INTRAVENOUS

## 2023-03-16 MED ORDER — ATORVASTATIN 40 MG TABLET
40.0000 mg | ORAL_TABLET | Freq: Every evening | ORAL | Status: DC
Start: 2023-03-17 — End: 2023-03-17

## 2023-03-16 NOTE — ED APP Handoff Note (Signed)
Caitlyn Gomez is a 47 y.o. female presenting to the ED today for:  Abdominal Pain and Diarrhea    Additional History:     47 year old female patient with a past medical history significant for diverticulitis presenting to the emergency department today with complaints of left lower quadrant abdominal pain.  Reports symptoms for the past 3 days.      Pertinent Exam findings:  There were no vitals filed for this visit.    Gen: Nontoxic in appearance.  Head: NC/AT  Neck: Trachea midline. No gross meningismus.  Psych: Has decision making capacity.    Assessment: Medical Screening Exam.    Plan/MDM at Triage:  I have considered labs, imaging, EKG.  These have been ordered as clinically indicated.  I have considered that the patient may need medications.  However, due to treatment space limitation, medications considered have to be appropriate for the waiting room.    I have considered the patient's chronic conditions and have counseled the patient that we will be evaluating for the presence of an emergency medical condition today in which their chronic conditions may or may not play a role.  I have advised that the patient should notify their PCP of today's visit regardless of disposition.  I have considered the patient's social determinants of health and health care literacy while determining the patient's initial plan of care.  I have further advised that the patient will possibly see a 2nd emergency provider today during their stay to help promote throughput.    I have performed an initial medical screening evaluation in order to determine if the patient has an emergency medical condition. The patient has been evaluated.  Imminent life, limb, or sight threatening emergency will be taken to the treatment area immediately. The patient's orders will be reviewed by nursing staff and placed in the treatment area as soon as possible as bed capacity allows.  Should the patient's status change, or a new sign/symptom  develop, immediate notification of nursing or ED staff has been advised.

## 2023-03-16 NOTE — H&P (Signed)
[name]    HOSPITALIST H&P    Caitlyn Gomez 47 y.o. female [id number]   Date of Service: 03/16/2023    Date of Admission:  03/16/2023   PCP: Caitlyn Gomez [facility] Code Status:No Order       Chief Complaint:  "  Abdominal pain "    HPI:   47 year old female patient with a past medical history significant for diverticulitis presenting to the emergency department today with complaints of left lower quadrant abdominal pain. Reports symptoms for the past 3 days     Some nausea 1 time episode of vomiting chills in the ED she has a fever says the pain in her lower quadrants as severe    CT of the abdomen showing diverticulitis the patient is being admitted for further management patient denies any recent colonoscopy    No other issues currently                    ED medications:   Medications Administered in the ED   iohexol (OMNIPAQUE 350) infusion (75 mL Intravenous Given 03/16/23 1929)   methylPREDNISolone sod succ (SOLU-medrol) 125 mg/2 mL injection (125 mg Intravenous Given 03/16/23 2058)   ketorolac (TORADOL) 30 mg/mL injection (30 mg Intravenous Given 03/16/23 2059)   ciprofloxacin (CIPRO) tablet (500 mg Oral Given 03/16/23 2102)   metroNIDAZOLE (FLAGYL) 500 mg in NS 100 mL premix IVPB (500 mg Intravenous New Bag/New Syringe 03/16/23 2053)   acetaminophen (TYLENOL) tablet (has no administration in time range)         PMHx:    Past Medical History:   Diagnosis Date    A-fib (CMS HCC)     Dr. Kathie Rhodes. Gomez    Abnormal Pap smear     Anxiety     Arthropathy     Asthma     Blastoma (CMS HCC)     ears    Brain tumor (benign) (CMS HCC)     Cervical myelopathy (CMS HCC)     Chiari I malformation (CMS HCC) 2021    Chronic obstructive airway disease (CMS HCC)     Complex regional pain syndrome I     Coronary artery disease     Depression     Depression 10/05/2021    Dysplasia of cervix     Dysrhythmias     a fib    Ectopic pregnancy     Fibromyalgia     GERD (gastroesophageal reflux disease)      controlled with med    History of anesthesia complications     aspirated for scope last time    History of cervical cancer     HTN (hypertension)     HTN (hypertension) 10/05/2021    Hyperlipidemia     NAFLD (nonalcoholic fatty liver disease)     Neck mass     PCOS (polycystic ovarian syndrome)     Radiculopathy     Smoking addiction     Stroke (CMS [facility])     TIA- [place] - 2016 - (left facial droop and left arm weakness)    Thyroid disorder     nodule    Thyroid nodule     Varicosities         PSHx:   Past Surgical History:   Procedure Laterality Date    BLADDER SURGERY  2023    mesh placed    CYSTOSCOPY  11/04/2022    cystoscopy, retropubic mid urethral sling    HX APPENDECTOMY  HX DILATION AND CURETTAGE      HX ENDOMETRIAL ABLATION      HX HEART CATHETERIZATION      stent    HX HYSTERECTOMY      HX TONSILLECTOMY      HX TUBAL LIGATION            Allergies:    Allergies   Allergen Reactions    Bee Venom Protein (Honey Bee) Anaphylaxis    Hymenoptera Allergenic Extract      Other reaction(s): UNKNOWN    Penicillins Hives/ Urticaria and Rash     Other reaction(s): Rash, Skin Irritation  Rash and swellling      Sulfa (Sulfonamides) Rash and Hives/ Urticaria     Other Reaction(s): Rash-Raised    Rash and swelling    Amoxicillin Rash    Social History  Social History     Tobacco Use    Smoking status: Every Day     Current packs/day: 1.00     Average packs/day: 1 pack/day for 27.7 years (27.7 ttl pk-yrs)     Types: Cigarettes     Start date: 1997    Smokeless tobacco: Never   Vaping Use    Vaping status: Never Used   Substance Use Topics    Alcohol use: Not Currently     Alcohol/week: 0.8 standard drinks of alcohol     Types: 1 Glasses of wine per week     Comment: 2-3 times a month;    Drug use: Never       Family History  Family Medical History:       Problem Relation (Age of Onset)    Colon Cancer Maternal Grandfather    Colon Polyps Maternal Grandmother    Diabetes Maternal Grandmother, Paternal Aunt    Healthy  Mother    Heart Disease Father    Hypertension (High Blood Pressure) Father    Inflammatory Bowel Dz Maternal cousin    Stomach Cancer Maternal Aunt    Sudden Death no cause Other               Home Meds:      Prior to Admission medications    Medication Sig Start Date End Date Taking? Authorizing Provider   Caitlyn Gomez 62.5-25 mcg/actuation Inhalation oral diskus inhaler Take 1 Puff by inhalation Once a day 01/05/23  Yes Provider, Historical   apixaban (ELIQUIS) 5 mg Oral Tablet Take 1 Tablet (5 mg total) by mouth Twice daily    Provider, Historical   atorvastatin (LIPITOR) 40 mg Oral Tablet Take 1 Tablet (40 mg total) by mouth Every evening    Provider, Historical   fluticasone propion-salmeteroL (ADVAIR) 230-21 mcg/actuation Inhalation oral inhaler Take 2 Puffs by inhalation Twice daily 07/17/21   Caitlyn Sailors, MD   ipratropium-albuterol 0.5 mg-3 mg(2.5 mg base)/3 mL Solution for Nebulization Take 3 mL by nebulization Four times a day for 7 days  Patient taking differently: Take 3 mL by nebulization Four times a day as needed 07/30/22 11/04/22  Caitlyn Kayser, APRN   metoprolol succinate (TOPROL-XL) 50 mg Oral Tablet Sustained Release 24 hr Take 1 Tablet (50 mg total) by mouth Once a day 04/16/20   Provider, Historical   nitroGLYCERIN (NITROSTAT) 0.3 mg Sublingual Tablet, Sublingual Place 1 Tablet (0.3 mg total) under the tongue Every 5 minutes as needed for Chest pain for 3 doses over 15 minutes    Provider, Historical   omeprazole (PRILOSEC) 40 mg Oral Capsule, Delayed Release(E.C.) Take 1  Capsule (40 mg total) by mouth Twice daily  Patient taking differently: Take 1 Capsule (40 mg total) by mouth Once a day 04/12/20   Caitlyn Grumbles, DO   ondansetron (ZOFRAN ODT) 4 mg Oral Tablet, Rapid Dissolve Take 1 Tablet (4 mg total) by mouth Every 8 hours as needed for Nausea/Vomiting 12/16/22   Caitlyn Kayser, APRN   PROAIR HFA 90 mcg/actuation Inhalation oral inhaler Take 1 Puff by inhalation Every 4 hours as  needed 07/17/21   Caitlyn Sailors, MD   diclofenac sodium (VOLTAREN) 75 mg Oral Tablet, Delayed Release (E.C.) Take 1 Tablet (75 mg total) by mouth Twice daily 11/30/22 03/16/23  Ellis Savage, DO   phenazopyridine (PYRIDIUM) 200 mg Oral Tablet Take 1 Tablet (200 mg total) by mouth Three times a day 11/30/22 03/16/23  Ellis Savage, DO          ROS:  All systems reviewed and nothing to note except as mentioned in the HPI      Results for orders placed or performed during the hospital encounter of 03/16/23 (from the past 24 hour(s))   COMPREHENSIVE METABOLIC PANEL, NON-FASTING   Result Value Ref Range    SODIUM 136 136 - 145 mmol/L    POTASSIUM 3.7 3.5 - 5.1 mmol/L    CHLORIDE 105 98 - 107 mmol/L    CO2 TOTAL 24 21 - 31 mmol/L    ANION GAP 7 4 - 13 mmol/L    BUN 11 7 - 25 mg/dL    CREATININE 1.61 0.96 - 1.30 mg/dL    BUN/CREA RATIO 14 6 - 22    ESTIMATED GFR 92 >59 mL/min/1.59m^2    ALBUMIN 3.8 3.5 - 5.7 g/dL    CALCIUM 8.7 8.6 - 04.5 mg/dL    GLUCOSE 85 74 - 409 mg/dL    ALKALINE PHOSPHATASE 64 34 - 104 U/L    ALT (SGPT) 15 7 - 52 U/L    AST (SGOT) 14 13 - 39 U/L    BILIRUBIN TOTAL 0.6 0.3 - 1.0 mg/dL    PROTEIN TOTAL 7.5 6.4 - 8.9 g/dL    ALBUMIN/GLOBULIN RATIO 1.0 0.8 - 1.4    OSMOLALITY, CALCULATED 271 270 - 290 mOsm/kg    CALCIUM, CORRECTED 8.9 8.9 - 10.8 mg/dL    GLOBULIN 3.7 2.9 - 5.4   LIPASE   Result Value Ref Range    LIPASE 29 11 - 82 U/L   LACTIC ACID LEVEL W/ REFLEX FOR LEVEL >2.0   Result Value Ref Range    LACTIC ACID 0.9 0.5 - 2.2 mmol/L   CBC WITH DIFF   Result Value Ref Range    WBC 11.7 3.8 - 11.8 x10^3/uL    RBC 4.54 3.63 - 4.92 x10^6/uL    HGB 13.1 10.9 - 14.3 g/dL    HCT 81.1 91.4 - 78.2 %    MCV 88.1 75.5 - 95.3 fL    MCH 28.8 24.7 - 32.8 pg    MCHC 32.6 32.3 - 35.6 g/dL    RDW 95.6 21.3 - 08.6 %    PLATELETS 251 140 - 440 x10^3/uL    MPV 9.4 7.9 - 10.8 fL    NEUTROPHIL % 73 43 - 77 %    LYMPHOCYTE % 15 (L) 16 - 44 %    MONOCYTE % 11 5 - 13 %    EOSINOPHIL % 1 1 - 7 %    BASOPHIL % 1 0 - 1  %    NEUTROPHIL #  8.50 (H) 1.85 - 7.80 x10^3/uL    LYMPHOCYTE # 1.70 1.00 - 3.00 x10^3/uL    MONOCYTE # 1.20 (H) 0.30 - 1.00 x10^3/uL    EOSINOPHIL # 0.10 0.00 - 0.50 x10^3/uL    BASOPHIL # 0.10 0.00 - 0.10 x10^3/uL   URINALYSIS, MACROSCOPIC   Result Value Ref Range    COLOR Yellow Colorless, Light Yellow, Yellow    APPEARANCE Turbid (A) Clear    SPECIFIC GRAVITY 1.025 1.002 - 1.030    PH 6.0 5.0 - 9.0    LEUKOCYTES 75 (A) Negative, 100  WBCs/uL    NITRITE Negative Negative    PROTEIN 10 Negative, 10 , 20  mg/dL    GLUCOSE Negative Negative, 30  mg/dL    KETONES Trace Negative, Trace mg/dL    BILIRUBIN Negative Negative, 0.5 mg/dL    BLOOD 2.20 Negative, 0.03 mg/dL    UROBILINOGEN 2 (A) Normal mg/dL   URINALYSIS, MICROSCOPIC   Result Value Ref Range    BACTERIA Rare (A) Negative /hpf    MUCOUS Occasional (A) (none) /hpf    RBCS 3 <4 /hpf    WBCS 3 <6 /hpf    SQUAMOUS EPITHELIAL 17 <28 /hpf    NON-SQUAMOUS EPITHELIAL CELLS URINE <1 <=1 /hpf          Physical:  Filed Vitals:    03/16/23 1825 03/16/23 2120 03/16/23 2215   BP: (!) 125/92 125/60 133/64   Pulse: (!) 114 86 88   Resp: 18  18   Temp: 37.8 C (100.1 F) (!) 38.1 C (100.6 F)    SpO2: 98% 100% 94%      General: Patient is alert and oriented to person, place, and time.  Moderate distress.    Eyes:   Extraocular movements intact.  Conjunctiva normal. Sclerae are normal.     Throat: Moist oral mucosa. No erythema or exudate of the pharynx. Clear oropharynx.    Neck: Supple. No cervical lymphadenopathy or supraclavicular nodes detected. Trachea midline   Heart: Regular rate and rhythm.    Lungs: Clear to auscultation bilaterally with no wheezes    Abdomen: Soft, left lower quadrant tenderness, suprapubic tenderness  Extremities: No edema, cyanosis, or clubbing. Grossly moves all extremities.    Skin: Warm and dry without lesions. No ecchymosis noted.    Neurologic: Cranial nerves II through XII are grossly intact. Sensation to light touch is intact. Strength 5/5  in upper extremities and lower extremities bilaterally.                   @PEVF @    Assessments:  There are no active hospital problems to display for this patient.    Diverticulitis start Levaquin and Flagyl, full liquid diet advance as tolerated  Outpatient follow-up with GI for colonoscopy,ns at 75 cc/hr    2. Pain management analgesics p.r.n. per Mar    3. Chronic obstructive pulmonary disease not in exacerbation breathing treatments p.r.n. continue home meds    4. Nicotine dependence, nicotine patch p.r.n.    5. Atrial fibrillation continue home medications    6. Mild leukocytosis multifactorial secondary to diverticulitis, pain and stress, CBC in a.m. treat underlying cause    7.Other chronic medical problems mentioned below we will continue home meds  Anxiety  Asthma  CAD  Depression  Gastroesophageal reflux disease  Morbid obesity  Hypertension  Dyslipidemia    Plan:  Patient is a   who will be placed in observation for the above problems.  Code status: No Order  DVT prophylaxis: scd    Diet:  Full    Disposition:  The patient is currently acutely ill requiring treatment on the medical floor for diverticulitis. Patient will be closely evaluated monitor and remove be adjusted accordingly.  Estimated length of stay less than 48 hr to obtain full medical treatment.      Azalia Bilis, MD    [facility] MEDICINE HOSPITALIST

## 2023-03-16 NOTE — ED Triage Notes (Signed)
LLQ pain. States she thinks her diverticulitis is flared. Reports diarrhea.

## 2023-03-16 NOTE — ED Provider Notes (Signed)
[facility]  ED Primary Provider Note  Patient Name: Caitlyn Gomez  Patient Age: 47 y.o.  Date of Birth: May 05, 1976    Chief Complaint: Abdominal Pain and Diarrhea        History of Present Illness       Caitlyn Gomez is a 47 y.o. female who had concerns including Abdominal Pain and Diarrhea.  Patient presents to ED today for abdominal pain and diarrhea patient states symptoms has been going on for approximately 3-4 days now.  Patient states she has a history of diverticulitis and feels that she is having a diverticulitis flare.  Patient reports bloody diarrhea.  Patient was complaining of left lower quadrant pain.  Patient states that she recently ate fried okra and these symptoms started after that.  Patient rates pain a 10/10.  Patient denies any nausea or vomiting.        Review of Systems     No other overt Review of Systems are noted to be positive except noted in the HPI.      Historical Data   History Reviewed This Encounter: Medical History  Surgical History  Family History  Social History      Physical Exam   ED Triage Vitals [03/16/23 1825]   BP (Non-Invasive) (!) 125/92   Heart Rate (!) 114   Respiratory Rate 18   Temperature 37.8 C (100.1 F)   SpO2 98 %   Weight (!) 137 kg (303 lb)   Height 1.676 m (5\' 6" )         Nursing notes reviewed for what could be assessed. Past Medical, Surgical, and Social history reviewed for what has been completed.     Constitutional: NAD. Well-Developed. Well Nourished.  Obese  Head: Normocephalic, atraumatic.  Mouth/Throat:  Moist mucous membranes.  Eyes: EOM grossly intact, conjunctiva normal.  Neck: Supple  Cardiovascular: Regular Rate and Rhythm, extremities well perfused.  Pulmonary/Chest: No respiratory distress. Lungs are symmetric to auscultation bilaterally.    Abdominal: Soft, tender upon palpation of the left lower and left low right quadrant, non-distended. Non peritoneal, no rebound, no guarding.  MSK: No Lower  Extremity Edema.  Skin: Warm, dry, and intact  Neuro: Appropriate, CN II-XII grossly intact. Gait at patient's baseline  Psych: Pleasant             Procedures      Patient Data     Labs Ordered/Reviewed   CBC WITH DIFF - Abnormal; Notable for the following components:       Result Value    LYMPHOCYTE % 15 (*)     NEUTROPHIL # 8.50 (*)     MONOCYTE # 1.20 (*)     All other components within normal limits   URINALYSIS, MACROSCOPIC - Abnormal; Notable for the following components:    APPEARANCE Turbid (*)     LEUKOCYTES 75 (*)     UROBILINOGEN 2 (*)     All other components within normal limits   URINALYSIS, MICROSCOPIC - Abnormal; Notable for the following components:    BACTERIA Rare (*)     MUCOUS Occasional (*)     All other components within normal limits   COMPREHENSIVE METABOLIC PANEL, NON-FASTING - Normal    Narrative:     Estimated Glomerular Filtration Rate (eGFR) is calculated using the CKD-EPI (2021) equation, intended for patients 80 years of age and older. If gender is not documented or "unknown", there will be no eGFR calculation.  LIPASE - Normal   LACTIC ACID LEVEL W/ REFLEX FOR LEVEL >2.0 - Normal   URINE CULTURE,ROUTINE   CBC/DIFF    Narrative:     The following orders were created for panel order CBC/DIFF.  Procedure                               Abnormality         Status                     ---------                               -----------         ------                     CBC WITH ZOXW[[id number]]                Abnormal            Final result                 Please view results for these tests on the individual orders.   URINALYSIS, MACROSCOPIC AND MICROSCOPIC W/CULTURE REFLEX    Narrative:     The following orders were created for panel order URINALYSIS, MACROSCOPIC AND MICROSCOPIC W/CULTURE REFLEX [PRN ONLY].  Procedure                               Abnormality         Status                     ---------                               -----------         ------                      URINALYSIS, MACROSCOPIC[[id number]]      Abnormal            Final result               URINALYSIS, MICROSCOPIC[[id number]]      Abnormal            Final result                 Please view results for these tests on the individual orders.       CT ABDOMEN PELVIS W IV CONTRAST   Final Result by Edi, Radresults In (08/27 2025)   DIVERTICULITIS.      HEPATIC STEATOSIS.          One or more dose reduction techniques were used (e.g., Automated exposure control, adjustment of the mA and/or kV according to patient size, use of iterative reconstruction technique).         Radiologist location ID: [id number]             Medical Decision Making          Medical Decision Making  Risk  OTC drugs.  Prescription drug management.          Studies Assessed:  Labs, imaging          MDM Narrative:  Patient presents to ED today for abdominal pain and diarrhea patient states symptoms has been going on for approximately 3-4 days now.  Patient states she has a history of diverticulitis and feels that she is having a diverticulitis flare.  Patient reports bloody diarrhea.  Patient was complaining of left lower quadrant pain.  Patient states that she recently ate fried okra and these symptoms started after that.  Patient rates pain a 10/10.  Patient denies any nausea or vomiting.  Patient's white count is 11.7.  Patient's fever has continued to rise in the emergency department today.  I did discuss admission with the patient and she is in agreeance.      ED Course as of 03/16/23 2202   Tue Mar 16, 2023   2048 CT abdomen and pelvis shows    IMPRESSION:  DIVERTICULITIS.     HEPATIC STEATOSIS.           Medications Administered in the ED   acetaminophen (TYLENOL) tablet (has no administration in time range)   iohexol (OMNIPAQUE 350) infusion (75 mL Intravenous Given 03/16/23 1929)   methylPREDNISolone sod succ (SOLU-medrol) 125 mg/2 mL injection (125 mg Intravenous Given 03/16/23 2058)   ketorolac (TORADOL) 30 mg/mL injection (30 mg  Intravenous Given 03/16/23 2059)   ciprofloxacin (CIPRO) tablet (500 mg Oral Given 03/16/23 2102)   metroNIDAZOLE (FLAGYL) 500 mg in NS 100 mL premix IVPB (500 mg Intravenous New Bag/New Syringe 03/16/23 2053)       Patient will be admitted to the  service for further workup and management.    Disposition: Admitted             Clinical Impression   Diverticulitis (Primary)   Abdominal pain, unspecified abdominal location   Fever, unspecified fever cause         Current Discharge Medication List              Bunnie Domino FNP-C  Department of Emergency Medicine

## 2023-03-17 DIAGNOSIS — J441 Chronic obstructive pulmonary disease with (acute) exacerbation: Secondary | ICD-10-CM

## 2023-03-17 LAB — SCAN DIFFERENTIAL: PLATELET MORPHOLOGY COMMENT: NORMAL

## 2023-03-17 LAB — BASIC METABOLIC PANEL
ANION GAP: 11 mmol/L (ref 4–13)
BUN/CREA RATIO: 15 (ref 6–22)
BUN: 11 mg/dL (ref 7–25)
CALCIUM: 8.7 mg/dL (ref 8.6–10.3)
CHLORIDE: 105 mmol/L (ref 98–107)
CO2 TOTAL: 20 mmol/L — ABNORMAL LOW (ref 21–31)
CREATININE: 0.75 mg/dL (ref 0.60–1.30)
ESTIMATED GFR: 101 mL/min/{1.73_m2} (ref >59)
GLUCOSE: 124 mg/dL — ABNORMAL HIGH (ref 74–109)
OSMOLALITY, CALCULATED: 273 mOsm/kg (ref 270–290)
POTASSIUM: 4.5 mmol/L (ref 3.5–5.1)
SODIUM: 136 mmol/L (ref 136–145)

## 2023-03-17 LAB — HEPATIC FUNCTION PANEL
ALBUMIN/GLOBULIN RATIO: 1.1 (ref 0.8–1.4)
ALBUMIN: 3.8 g/dL (ref 3.5–5.7)
ALKALINE PHOSPHATASE: 68 U/L (ref 34–104)
ALT (SGPT): 15 U/L (ref 7–52)
AST (SGOT): 18 U/L (ref 13–39)
BILIRUBIN DIRECT: 0.08 md/dL (ref 0.03–0.18)
BILIRUBIN TOTAL: 0.6 mg/dL (ref 0.3–1.0)
BILIRUBIN, INDIRECT: 0.52 mg/dL (ref <=1)
GLOBULIN: 3.6 (ref 2.9–5.4)
PROTEIN TOTAL: 7.4 g/dL (ref 6.4–8.9)

## 2023-03-17 LAB — CBC WITH DIFF
BASOPHIL #: 0 10*3/uL (ref 0.00–0.10)
BASOPHIL %: 1 % (ref 0–1)
EOSINOPHIL #: 0 10*3/uL (ref 0.00–0.50)
EOSINOPHIL %: 1 % (ref 1–7)
HCT: 40.7 % (ref 31.2–41.9)
HGB: 13.7 g/dL (ref 10.9–14.3)
LYMPHOCYTE #: 0.7 10*3/uL — ABNORMAL LOW (ref 1.00–3.00)
LYMPHOCYTE %: 7 % — ABNORMAL LOW (ref 16–44)
MCH: 29.3 pg (ref 24.7–32.8)
MCHC: 33.6 g/dL (ref 32.3–35.6)
MCV: 87.2 fL (ref 75.5–95.3)
MONOCYTE #: 0.1 10*3/uL — ABNORMAL LOW (ref 0.30–1.00)
MONOCYTE %: 1 % — ABNORMAL LOW (ref 5–13)
MPV: 9.6 fL (ref 7.9–10.8)
NEUTROPHIL #: 9 10*3/uL — ABNORMAL HIGH (ref 1.85–7.80)
NEUTROPHIL %: 91 % — ABNORMAL HIGH (ref 43–77)
PLATELETS: 244 10*3/uL (ref 140–440)
RBC: 4.67 10*6/uL (ref 3.63–4.92)
RDW: 13.9 % (ref 12.3–17.7)
WBC: 9.9 10*3/uL (ref 3.8–11.8)

## 2023-03-17 LAB — MAGNESIUM: MAGNESIUM: 2 mg/dL (ref 1.9–2.7)

## 2023-03-17 MED ORDER — APIXABAN 5 MG TABLET
ORAL_TABLET | ORAL | Status: AC
Start: 2023-03-17 — End: 2023-03-17
  Filled 2023-03-17: qty 1

## 2023-03-17 MED ORDER — METOPROLOL SUCCINATE ER 25 MG TABLET,EXTENDED RELEASE 24 HR
ORAL_TABLET | ORAL | Status: AC
Start: 2023-03-17 — End: 2023-03-17
  Filled 2023-03-17: qty 2

## 2023-03-17 MED ORDER — METRONIDAZOLE 500 MG/100 ML IN SODIUM CHLOR(ISO) INTRAVENOUS PIGGYBACK
INJECTION | INTRAVENOUS | Status: AC
Start: 2023-03-17 — End: 2023-03-17
  Filled 2023-03-17: qty 100

## 2023-03-17 MED ORDER — METRONIDAZOLE 500 MG TABLET
500.0000 mg | ORAL_TABLET | Freq: Three times a day (TID) | ORAL | 0 refills | Status: DC
Start: 2023-03-17 — End: 2023-03-25

## 2023-03-17 MED ORDER — LEVOFLOXACIN 750 MG TABLET
750.0000 mg | ORAL_TABLET | Freq: Every day | ORAL | 0 refills | Status: DC
Start: 2023-03-17 — End: 2023-03-25

## 2023-03-17 NOTE — ED Nurses Note (Signed)
 Patient resting in ER stretcher with eyes closed. Respirations even and non labored, no acute distress noted. Will continue to monitor.

## 2023-03-17 NOTE — Discharge Summary (Signed)
[facility]  DISCHARGE SUMMARY    PATIENT NAME:  Caitlyn Gomez, Caitlyn Gomez  MRN:  [id number]  DOB:  02-Apr-1976    ENCOUNTER DATE:  03/16/2023  INPATIENT ADMISSION DATE:   DISCHARGE DATE:  03/17/2023    ATTENDING PHYSICIAN: Ebbie Ridge, DO  SERVICE: PRN HOSPITALIST 3  PRIMARY CARE PHYSICIAN: [facility]       No lay caregiver identified.    PRIMARY DISCHARGE DIAGNOSIS:    Active Hospital Problems    Diagnosis Date Noted    Diverticulitis [K57.92] 03/16/2023      Resolved Hospital Problems   No resolved problems to display.     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        Details   levoFLOXacin 750 mg Tablet  Commonly known as: LEVAQUIN   750 mg, Oral, DAILY  Qty: 10 Tablet  Refills: 0     metroNIDAZOLE 500 mg Tablet  Commonly known as: FLAGYL   500 mg, Oral, 3 TIMES DAILY  Qty: 30 Tablet  Refills: 0            CONTINUE these medications - NO CHANGES were made during your visit.        Details   Anoro Ellipta 62.5-25 mcg/actuation oral diskus inhaler  Generic drug: umeclidinium-vilanteroL   1 Puff, Inhalation, DAILY  Refills: 0     apixaban 5 mg Tablet  Commonly known as: ELIQUIS   5 mg, Oral, 2 TIMES DAILY  Refills: 0     atorvastatin 40 mg Tablet  Commonly known as: LIPITOR   40 mg, Oral, EVERY EVENING  Refills: 0     ipratropium-albuteroL 0.5 mg-3 mg(2.5 mg base)/3 mL nebulizer solution  Commonly known as: DUONEB   3 mL, Nebulization, 4 TIMES DAILY  Qty: 28 Each  Refills: 0     metoprolol  succinate 50 mg Tablet Sustained Release 24 hr  Commonly known as: TOPROL-XL   1 Tablet, Oral, DAILY  Refills: 0     nitroGLYCERIN 0.4 mg Tablet, Sublingual  Commonly known as: NITROSTAT   0.4 mg, Sublingual, EVERY 5 MIN PRN, for 3 doses over 15 minutes   Refills: 0     omeprazole 40 mg Capsule, Delayed Release(E.C.)  Commonly known as: PRILOSEC   40 mg, Oral, 2 TIMES DAILY  Qty: 90 Capsule  Refills: 4     ondansetron 4 mg Tablet, Rapid Dissolve  Commonly known as: ZOFRAN ODT   4 mg, Oral, EVERY 8 HOURS PRN  Qty: 12 Tablet  Refills: 0     ProAir HFA 90 mcg/actuation oral inhaler  Generic drug: albuterol sulfate   1 Puff, Inhalation, EVERY 4 HOURS PRN  Qty: 6.7 g  Refills: 3            Discharge med list refreshed?  YES     Allergies   Allergen Reactions    Bee Venom Protein (Honey Bee) Anaphylaxis  Hymenoptera Allergenic Extract      Other reaction(s): UNKNOWN    Penicillins Hives/ Urticaria and Rash     Other reaction(s): Rash, Skin Irritation  Rash and swellling      Sulfa (Sulfonamides) Rash and Hives/ Urticaria     Other Reaction(s): Rash-Raised    Rash and swelling    Amoxicillin Rash     HOSPITAL PROCEDURE(S):   No orders of the defined types were placed in this encounter.      REASON FOR HOSPITALIZATION AND HOSPITAL COURSE   BRIEF HPI:  This is a 47 y.o., female admitted for abdominal pain and was diagnosed with diverticulitis.  Patient had CT scan showing diverticulitis without evidence of rupture abscess formation.  She was initiated on IV antibiotics, and started to improve this morning.  States that she is not having near as much pain she had before.  Patient tolerating p.o. intake, can be discharged home with follow up with PCP and surgeon.  Will complete 10 days total antibiotics.  Will need consideration for outpatient colonoscopy.    Physical exam:  BP 119/81   Pulse 58   Temp 36.3 C (97.4 F)   Resp 19   Ht 1.676 m (5\' 6" )   Wt (!) 137 kg (303 lb)   LMP 09/07/2009   SpO2 90%   BMI 48.91  kg/m       Gen: This is a 47 y.o. Year-old female  who is awake alert and oriented x3 in no acute distress  CV:  Regular rate and rhythm without murmurs rubs or gallops.  2+ pedal and radial pulses. No clubbing, cyanosis, or edema.  RESP:  Clear to auscultation bilaterally without wheezes rales or rhonchi.  Respirations are nonlabored.  GI:  Abdomen is soft, mildly tender, nondistended.  Bowel sounds normoactive.  GU: No foley is present  MSK:  Full range of motion of upper and lower extremities  NEURO:  Cranial nerves 2-12 grossly intact.  No focal deficits as tested.  SKIN: Warm, dry, intact, without lesions, rashes, or ulcerations      CONDITION ON DISCHARGE:  A. Ambulation: Full ambulation  B. Self-care Ability: Complete  C. Cognitive Status Alert and Oriented x 3  D. Code status at discharge:       LINES/DRAINS/WOUNDS AT DISCHARGE:   Patient Lines/Drains/Airways Status       Active Line / Dialysis Catheter / Dialysis Graft / Drain / Airway / Wound       Name Placement date Placement time Site Days    Peripheral IV Left Basilic  (medial side of arm) 03/16/23  1834  -- less than 1    Wound  Incision  11/04/22  --  -- 133                    DISCHARGE DISPOSITION:  Home discharge  DISCHARGE INSTRUCTIONS:  Post-Discharge Follow Up Appointments       Tuesday Mar 23, 2023    PRN  MRI with PRN MR MOBILE 1 at 10:15 AM    PRN  MRI with PRN MR MOBILE 1 at 11:00 AM      Imaging Services, [facility], [place]   [address]  [place] [address]  [phone]          No discharge procedures on file.       Ebbie Ridge, DO    Copies sent to Care Team  Relationship Specialty Notifications Start End    Care, [facility] P[facility] - General EXTERNAL  10/04/21     Phone: [phone] Fax: [phone]         [address] [place] [address]    Lilian Coma, MD PCP - Managed Care/Insurance   06/24/09     Phone: [phone] Fax: [phone]         [address] [place]  [address]            Referring providers can utilize [email or web address] to access their referred [facility] patient's information.

## 2023-03-17 NOTE — ED Nurses Note (Signed)
 Patient resting in ER stretcher with eyes closed. Respirations even and non labored, no acute distress noted. Will continue to monitor patient.

## 2023-03-17 NOTE — ED Nurses Note (Signed)
Patient discharged to home. A written copy of discharge instructions was given to the patient. Questions sufficiently answered as needed. Patient encouraged to follow up with PCP as indicated. IV removed, catheter intact. Pressure dressing applied and bleeding controlled. Patient left department via ambulation.

## 2023-03-18 ENCOUNTER — Telehealth (HOSPITAL_COMMUNITY): Payer: Self-pay

## 2023-03-18 LAB — URINE CULTURE,ROUTINE: URINE CULTURE: >100000 — AB

## 2023-03-18 NOTE — Telephone Encounter (Signed)
Transition of Care Contact Information  Discharge Date: 03/17/2023  Transition Facility Type--Hospital (Inpatient or Observation)  Facility Name--[facility]  Interactive Contact(s): Completed or attempted contact indicated by Date/Time  First Attempt Call: 03/18/2023 11:14 AM  Second Attempted Contact: 03/18/2023 11:18 AM  Third Attempted Contact: 03/18/2023 11:20 AM  Contact Method(s)-- Patient/Caregiver Telephone, MyChart Patient Portal  Clinical Staff Name/Role who contacted--Katrin Grabel, RN  Transition Note:I spoke with patient today. Jamarah was very distracted and a gentleman was on the other line as well. She kept yelling to people in the background "I'm on the phone!" She did say that she continues to have left lower flank pain and that she is taking her antibiotics as directed. I was reviewing and providing education on medications and asked Neddie if she had any questions. She did not respond. I asked if she was still on the line, and then the call disconnected. I attempted to call back, but no answer. I left a voice message. AVS reviewed. Patient instructed to follow up with PCP--contact phone number provided. Return to ED precautions were given along with options for non-emergent questions/concerns/sx. Nurse Navigator toll free number, resources available, and hours of operation provided to patient and/or caregiver. Dinah Beers, RN          Dinah Beers, RN

## 2023-03-21 ENCOUNTER — Other Ambulatory Visit: Payer: Self-pay

## 2023-03-21 ENCOUNTER — Emergency Department (HOSPITAL_COMMUNITY): Payer: Medicaid Other

## 2023-03-21 ENCOUNTER — Inpatient Hospital Stay
Admission: EM | Admit: 2023-03-21 | Discharge: 2023-03-25 | DRG: 378 | Disposition: A | Discharge status: Home / Self Care | Payer: Medicaid Other | Attending: Internal Medicine | Admitting: Internal Medicine

## 2023-03-21 ENCOUNTER — Encounter (HOSPITAL_COMMUNITY): Payer: Self-pay | Admitting: Emergency Medicine

## 2023-03-21 DIAGNOSIS — Z8673 Personal history of transient ischemic attack (TIA), and cerebral infarction without residual deficits: Secondary | ICD-10-CM

## 2023-03-21 DIAGNOSIS — Z7951 Long term (current) use of inhaled steroids: Secondary | ICD-10-CM

## 2023-03-21 DIAGNOSIS — Z7901 Long term (current) use of anticoagulants: Secondary | ICD-10-CM

## 2023-03-21 DIAGNOSIS — Z880 Allergy status to penicillin: Secondary | ICD-10-CM

## 2023-03-21 DIAGNOSIS — E785 Hyperlipidemia, unspecified: Secondary | ICD-10-CM | POA: Diagnosis present

## 2023-03-21 DIAGNOSIS — I482 Chronic atrial fibrillation, unspecified: Secondary | ICD-10-CM | POA: Diagnosis present

## 2023-03-21 DIAGNOSIS — J4489 Other specified chronic obstructive pulmonary disease: Secondary | ICD-10-CM | POA: Diagnosis present

## 2023-03-21 DIAGNOSIS — I2510 Atherosclerotic heart disease of native coronary artery without angina pectoris: Secondary | ICD-10-CM | POA: Diagnosis present

## 2023-03-21 DIAGNOSIS — K760 Fatty (change of) liver, not elsewhere classified: Secondary | ICD-10-CM | POA: Diagnosis present

## 2023-03-21 DIAGNOSIS — K219 Gastro-esophageal reflux disease without esophagitis: Secondary | ICD-10-CM | POA: Diagnosis present

## 2023-03-21 DIAGNOSIS — K5721 Diverticulitis of large intestine with perforation and abscess with bleeding: Principal | ICD-10-CM | POA: Diagnosis present

## 2023-03-21 DIAGNOSIS — Z79899 Other long term (current) drug therapy: Secondary | ICD-10-CM

## 2023-03-21 DIAGNOSIS — M797 Fibromyalgia: Secondary | ICD-10-CM | POA: Diagnosis present

## 2023-03-21 DIAGNOSIS — Z789 Other specified health status: Secondary | ICD-10-CM

## 2023-03-21 DIAGNOSIS — K5720 Diverticulitis of large intestine with perforation and abscess without bleeding: Principal | ICD-10-CM | POA: Diagnosis present

## 2023-03-21 DIAGNOSIS — F17210 Nicotine dependence, cigarettes, uncomplicated: Secondary | ICD-10-CM | POA: Diagnosis present

## 2023-03-21 DIAGNOSIS — I10 Essential (primary) hypertension: Secondary | ICD-10-CM | POA: Diagnosis present

## 2023-03-21 LAB — URINALYSIS, MACROSCOPIC
BILIRUBIN: NEGATIVE mg/dL
BLOOD: NEGATIVE mg/dL
GLUCOSE: NEGATIVE mg/dL
KETONES: NEGATIVE mg/dL
LEUKOCYTES: 75 WBCs/uL — AB
NITRITE: NEGATIVE
PH: 6.5 (ref 5.0–9.0)
PROTEIN: 10 mg/dL
SPECIFIC GRAVITY: 1.02 (ref 1.002–1.030)
UROBILINOGEN: NORMAL mg/dL

## 2023-03-21 LAB — COMPREHENSIVE METABOLIC PANEL, NON-FASTING
ALBUMIN/GLOBULIN RATIO: 0.9 (ref 0.8–1.4)
ALBUMIN: 3.5 g/dL (ref 3.5–5.7)
ALKALINE PHOSPHATASE: 60 U/L (ref 34–104)
ALT (SGPT): 13 U/L (ref 7–52)
ANION GAP: 7 mmol/L (ref 4–13)
AST (SGOT): 10 U/L — ABNORMAL LOW (ref 13–39)
BILIRUBIN TOTAL: 0.5 mg/dL (ref 0.3–1.0)
BUN/CREA RATIO: 9 (ref 6–22)
BUN: 8 mg/dL (ref 7–25)
CALCIUM, CORRECTED: 9.2 mg/dL (ref 8.9–10.8)
CALCIUM: 8.8 mg/dL (ref 8.6–10.3)
CHLORIDE: 103 mmol/L (ref 98–107)
CO2 TOTAL: 27 mmol/L (ref 21–31)
CREATININE: 0.88 mg/dL (ref 0.60–1.30)
ESTIMATED GFR: 83 mL/min/{1.73_m2} (ref >59)
GLOBULIN: 3.7 (ref 2.9–5.4)
GLUCOSE: 96 mg/dL (ref 74–109)
OSMOLALITY, CALCULATED: 272 mOsm/kg (ref 270–290)
POTASSIUM: 3.8 mmol/L (ref 3.5–5.1)
PROTEIN TOTAL: 7.2 g/dL (ref 6.4–8.9)
SODIUM: 137 mmol/L (ref 136–145)

## 2023-03-21 LAB — MAGNESIUM: MAGNESIUM: 2 mg/dL (ref 1.9–2.7)

## 2023-03-21 LAB — CBC WITH DIFF
BASOPHIL #: 0.1 10*3/uL (ref 0.00–0.10)
BASOPHIL %: 1 % (ref 0–1)
EOSINOPHIL #: 0.1 10*3/uL (ref 0.00–0.50)
EOSINOPHIL %: 1 % (ref 1–7)
HCT: 40.2 % (ref 31.2–41.9)
HGB: 13.3 g/dL (ref 10.9–14.3)
LYMPHOCYTE #: 1.9 10*3/uL (ref 1.00–3.00)
LYMPHOCYTE %: 17 % (ref 16–44)
MCH: 29.1 pg (ref 24.7–32.8)
MCHC: 33.1 g/dL (ref 32.3–35.6)
MCV: 87.9 fL (ref 75.5–95.3)
MONOCYTE #: 1.2 10*3/uL — ABNORMAL HIGH (ref 0.30–1.00)
MONOCYTE %: 10 % (ref 5–13)
MPV: 9.5 fL (ref 7.9–10.8)
NEUTROPHIL #: 8.2 10*3/uL — ABNORMAL HIGH (ref 1.85–7.80)
NEUTROPHIL %: 72 % (ref 43–77)
PLATELETS: 287 10*3/uL (ref 140–440)
RBC: 4.57 10*6/uL (ref 3.63–4.92)
RDW: 13.8 % (ref 12.3–17.7)
WBC: 11.4 10*3/uL (ref 3.8–11.8)

## 2023-03-21 LAB — LACTIC ACID LEVEL W/ REFLEX FOR LEVEL >2.0: LACTIC ACID: 0.7 mmol/L (ref 0.5–2.2)

## 2023-03-21 LAB — THYROID STIMULATING HORMONE (SENSITIVE TSH): TSH: 4.526 u[IU]/mL (ref 0.450–5.330)

## 2023-03-21 LAB — URINALYSIS, MICROSCOPIC
BACTERIA: NEGATIVE /hpf
RBCS: 3 /hpf (ref <4)
SQUAMOUS EPITHELIAL: 29 /hpf — ABNORMAL HIGH (ref <28)
WBCS: 4 /hpf (ref <6)

## 2023-03-21 LAB — C-REACTIVE PROTEIN (CRP): C-REACTIVE PROTEIN (CRP): 11.1 mg/dL — ABNORMAL HIGH (ref 0.1–0.5)

## 2023-03-21 LAB — LIPASE: LIPASE: 22 U/L (ref 11–82)

## 2023-03-21 MED ORDER — HYDROCODONE 7.5 MG-ACETAMINOPHEN 325 MG TABLET
ORAL_TABLET | ORAL | Status: AC
Start: 2023-03-21 — End: 2023-03-21
  Filled 2023-03-21: qty 1

## 2023-03-21 MED ORDER — HYDROCODONE 5 MG-ACETAMINOPHEN 325 MG TABLET
1.0000 | ORAL_TABLET | ORAL | Status: AC
Start: 2023-03-21 — End: 2023-03-21
  Administered 2023-03-21: 1 via ORAL

## 2023-03-21 MED ORDER — NICOTINE 21 MG/24 HR DAILY TRANSDERMAL PATCH
21.0000 mg | MEDICATED_PATCH | Freq: Every day | TRANSDERMAL | Status: DC
Start: 2023-03-21 — End: 2023-03-25
  Administered 2023-03-22 – 2023-03-25 (×4): 0 mg via TRANSDERMAL
  Filled 2023-03-21 (×3): qty 1

## 2023-03-21 MED ORDER — ONDANSETRON HCL (PF) 4 MG/2 ML INJECTION SOLUTION
INTRAMUSCULAR | Status: AC
Start: 2023-03-21 — End: 2023-03-21
  Filled 2023-03-21: qty 2

## 2023-03-21 MED ORDER — IOHEXOL 350 MG IODINE/ML INTRAVENOUS SOLUTION
100.0000 mL | INTRAVENOUS | Status: AC
Start: 2023-03-21 — End: 2023-03-21
  Administered 2023-03-21: 100 mL via INTRAVENOUS

## 2023-03-21 MED ORDER — ONDANSETRON HCL (PF) 4 MG/2 ML INJECTION SOLUTION
4.0000 mg | INTRAMUSCULAR | Status: AC
Start: 2023-03-21 — End: 2023-03-21
  Administered 2023-03-21: 4 mg via INTRAVENOUS

## 2023-03-21 MED ORDER — ONDANSETRON HCL (PF) 4 MG/2 ML INJECTION SOLUTION
4.0000 mg | Freq: Three times a day (TID) | INTRAMUSCULAR | Status: DC | PRN
Start: 2023-03-21 — End: 2023-03-25
  Administered 2023-03-21 – 2023-03-24 (×6): 4 mg via INTRAVENOUS
  Filled 2023-03-21 (×6): qty 2

## 2023-03-21 MED ORDER — PANTOPRAZOLE 40 MG TABLET,DELAYED RELEASE
DELAYED_RELEASE_TABLET | ORAL | Status: AC
Start: 2023-03-21 — End: 2023-03-21
  Filled 2023-03-21: qty 1

## 2023-03-21 MED ORDER — ATORVASTATIN 40 MG TABLET
40.0000 mg | ORAL_TABLET | Freq: Every evening | ORAL | Status: DC
Start: 2023-03-21 — End: 2023-03-25
  Administered 2023-03-21 – 2023-03-24 (×4): 40 mg via ORAL
  Filled 2023-03-21 (×4): qty 1

## 2023-03-21 MED ORDER — MORPHINE 2 MG/ML INJECTION WRAPPER
INJECTION | INTRAMUSCULAR | Status: AC
Start: 2023-03-21 — End: 2023-03-21
  Filled 2023-03-21: qty 1

## 2023-03-21 MED ORDER — IPRATROPIUM 0.5 MG-ALBUTEROL 3 MG (2.5 MG BASE)/3 ML NEBULIZATION SOLN
3.0000 mL | INHALATION_SOLUTION | Freq: Four times a day (QID) | RESPIRATORY_TRACT | Status: DC | PRN
Start: 2023-03-21 — End: 2023-03-25

## 2023-03-21 MED ORDER — ALBUTEROL SULFATE HFA 90 MCG/ACTUATION AEROSOL INHALER
1.0000 | INHALATION_SPRAY | RESPIRATORY_TRACT | Status: DC | PRN
Start: 2023-03-21 — End: 2023-03-25

## 2023-03-21 MED ORDER — METOPROLOL SUCCINATE ER 25 MG TABLET,EXTENDED RELEASE 24 HR
50.0000 mg | ORAL_TABLET | Freq: Every day | ORAL | Status: DC
Start: 2023-03-21 — End: 2023-03-25
  Administered 2023-03-21: 0 mg via ORAL
  Administered 2023-03-22 – 2023-03-25 (×4): 50 mg via ORAL
  Filled 2023-03-21 (×4): qty 2

## 2023-03-21 MED ORDER — ERTAPENEM 1 GRAM SOLUTION FOR INJECTION
1000.0000 mg | INTRAMUSCULAR | Status: AC
Start: 2023-03-21 — End: 2023-03-21
  Administered 2023-03-21: 1000 mg via INTRAVENOUS
  Filled 2023-03-21: qty 10

## 2023-03-21 MED ORDER — ACETAMINOPHEN 325 MG TABLET
650.0000 mg | ORAL_TABLET | Freq: Four times a day (QID) | ORAL | Status: DC | PRN
Start: 2023-03-21 — End: 2023-03-25

## 2023-03-21 MED ORDER — FENTANYL (PF) 50 MCG/ML INJECTION SOLUTION
50.0000 ug | INTRAMUSCULAR | Status: AC
Start: 2023-03-21 — End: 2023-03-21
  Administered 2023-03-21: 50 ug via INTRAVENOUS

## 2023-03-21 MED ORDER — PANTOPRAZOLE 40 MG TABLET,DELAYED RELEASE
40.0000 mg | DELAYED_RELEASE_TABLET | Freq: Two times a day (BID) | ORAL | Status: DC
Start: 2023-03-21 — End: 2023-03-25
  Administered 2023-03-21 – 2023-03-23 (×4): 40 mg via ORAL
  Administered 2023-03-23: 0 mg via ORAL
  Administered 2023-03-24 – 2023-03-25 (×4): 40 mg via ORAL
  Filled 2023-03-21 (×8): qty 1

## 2023-03-21 MED ORDER — HYDROCODONE 5 MG-ACETAMINOPHEN 325 MG TABLET
ORAL_TABLET | ORAL | Status: AC
Start: 2023-03-21 — End: 2023-03-21
  Filled 2023-03-21: qty 1

## 2023-03-21 MED ORDER — PANTOPRAZOLE 40 MG TABLET,DELAYED RELEASE
40.0000 mg | DELAYED_RELEASE_TABLET | Freq: Every day | ORAL | Status: DC
Start: 2023-03-21 — End: 2023-03-21
  Administered 2023-03-21: 40 mg via ORAL

## 2023-03-21 MED ORDER — UMECLIDINIUM 62.5 MCG-VILANTEROL 25 MCG/ACTUATION POWDR FOR INHALATION
1.0000 | DISK | Freq: Every day | RESPIRATORY_TRACT | Status: DC
Start: 2023-03-21 — End: 2023-03-25
  Administered 2023-03-21 – 2023-03-25 (×5): 1 via RESPIRATORY_TRACT

## 2023-03-21 MED ORDER — METOPROLOL TARTRATE 50 MG TABLET
ORAL_TABLET | ORAL | Status: AC
Start: 2023-03-21 — End: 2023-03-21
  Filled 2023-03-21: qty 1

## 2023-03-21 MED ORDER — NICOTINE 21 MG/24 HR DAILY TRANSDERMAL PATCH
MEDICATED_PATCH | TRANSDERMAL | Status: AC
Start: 2023-03-21 — End: 2023-03-21
  Filled 2023-03-21: qty 1

## 2023-03-21 MED ORDER — SODIUM CHLORIDE 0.9 % INTRAVENOUS SOLUTION
Freq: Once | INTRAVENOUS | Status: AC
Start: 2023-03-21 — End: 2023-03-21

## 2023-03-21 MED ORDER — MORPHINE 2 MG/ML INJECTION WRAPPER
2.0000 mg | INJECTION | INTRAMUSCULAR | Status: DC | PRN
Start: 2023-03-21 — End: 2023-03-25
  Administered 2023-03-21 – 2023-03-24 (×11): 2 mg via INTRAVENOUS
  Filled 2023-03-21 (×9): qty 1

## 2023-03-21 MED ORDER — HYDROCODONE 7.5 MG-ACETAMINOPHEN 325 MG TABLET
1.0000 | ORAL_TABLET | Freq: Four times a day (QID) | ORAL | Status: DC | PRN
Start: 2023-03-21 — End: 2023-03-23
  Administered 2023-03-21 – 2023-03-23 (×8): 1 via ORAL
  Filled 2023-03-21 (×6): qty 1

## 2023-03-21 MED ORDER — SODIUM CHLORIDE 0.9 % IV BOLUS
1000.0000 mL | INJECTION | Status: AC
Start: 2023-03-21 — End: 2023-03-21
  Administered 2023-03-21: 0 mL via INTRAVENOUS
  Administered 2023-03-21: 1000 mL via INTRAVENOUS

## 2023-03-21 MED ORDER — FENTANYL (PF) 50 MCG/ML INJECTION SOLUTION
INTRAMUSCULAR | Status: AC
Start: 2023-03-21 — End: 2023-03-21
  Filled 2023-03-21: qty 2

## 2023-03-21 NOTE — ED Nurses Note (Signed)
Report called at this. Tele monitor in place at this time.

## 2023-03-21 NOTE — ED Provider Notes (Signed)
[facility]  ED Primary Provider Note        Arrival: The patient arrived by Car     History of Present Illness   chief complaint  Caitlyn Gomez is a 47 y.o. female who had concerns including Abdominal Pain.  Patient is a 47 year old female who presents emergency room with left lower quadrant abdominal pain for 1 week.  She was states the pain is sharp and burning in nature.  She rates it as a 9/10.  There are no alleviating nor aggravating factors.  She was states she was had some nausea and vomiting.  She was also states that she was occasionally had some bloody diarrhea.  Patient was evaluated here in the emergency room on August 27th for same.  She was diagnosed with diverticulitis and brought into the hospital observation status for diverticulitis.  Patient was discharged on August 28th on levofloxacin 750 mg daily for 10 days and metronidazole 500 mg 3 times a day for 10 days.  Patient states she was continued to take these medications and it was missed no doses.  She also states she was seen at [facility] 2 days ago for same.  She states they wanted to admit her at that time; but she declined.  Heart rate is 88, respiratory rate is 21, oxygen saturation 98% on room air.  Patient was afebrile at 97.1.  Patient has been refusing to get her blood taken.  Patient states she does have a history of atrial fibrillation and is on Eliquis.  Patient also smokes 1 pack per day.  She does have a history of chronic obstructive pulmonary disease, hypertension, hyperlipidemia, depression.  All nursing notes reviewed        Review of Systems     No other overt Review of Systems are noted to be positive except noted in the HPI.      Historical Data   History Reviewed This Encounter: Medical History  Surgical History  Family History  Social History      Physical Exam   ED Triage Vitals   BP (Non-Invasive) 03/21/23 0445 101/65   Heart Rate 03/21/23 0418 88    Respiratory Rate 03/21/23 0418 (!) 21   Temperature 03/21/23 0418 36.2 C (97.1 F)   SpO2 03/21/23 0418 98 %   Weight --    Height --          Exam:   Constitutional:  Patient alert orient x3 in no apparent distress.  Obese.  No limitations.  Head: Atraumatic normocephalic  Eyes :  Pupils are equal round reactive to light and accommodation extraocular muscles are intact.  Sclera and conjunctiva are unremarkable  Ears:  Tympanic membranes are pearly gray bilaterally; external auditory canals are unremarkable; external ears without any lesions  Nose:  Nares are patent turbinates are pink and moist  Mouth:  Mucosa is pink and moist without lesions.  Posterior pharynx is pink and moist without hypertrophy/exudate.  Neck:  Soft and supple without palpable lymphadenopathy.  Heart:  Regular rate and rhythm without audible murmur  Lungs:  Clear to auscultation bilaterally without any wheezing/rales/rhonchi  Abdomen:  Abdomen is soft.  She was does have tenderness to palpation in the left lower quadrant.  It was no rebound.  No appreciable guarding.  Good bowel sounds throughout.  Dull to percussion.  Genitalia:  Deferred  Skin:  Warm and dry without lesions.  Normal skin turgor.  Brisk capillary refill distally  Extremities:  Good strength bilaterally with full range of motion of upper and lower extremities.  Neuro:  Alert oriented x3.  Cranial nerves II-XII grossly intact as tested.  Excellent sensation distally over all dermatomes.  Psychiatric:  Patient cooperative, affect appropriate, insight and judgment good          Procedures           Patient Data     Labs Ordered/Reviewed   COMPREHENSIVE METABOLIC PANEL, NON-FASTING - Abnormal; Notable for the following components:       Result Value    AST (SGOT) 10 (*)     All other components within normal limits    Narrative:     Estimated Glomerular Filtration Rate (eGFR) is calculated using the CKD-EPI (2021) equation, intended for patients 66 years of age and older. If  gender is not documented or "unknown", there will be no eGFR calculation.     CBC WITH DIFF - Abnormal; Notable for the following components:    NEUTROPHIL # 8.20 (*)     MONOCYTE # 1.20 (*)     All other components within normal limits   URINALYSIS, MACROSCOPIC - Abnormal; Notable for the following components:    APPEARANCE Turbid (*)     LEUKOCYTES 75 (*)     All other components within normal limits   URINALYSIS, MICROSCOPIC - Abnormal; Notable for the following components:    SQUAMOUS EPITHELIAL 29 (*)     All other components within normal limits   LACTIC ACID LEVEL W/ REFLEX FOR LEVEL >2.0 - Normal   LIPASE - Normal   MAGNESIUM - Normal   THYROID STIMULATING HORMONE (SENSITIVE TSH) - Normal   URINE CULTURE,ROUTINE   CBC/DIFF    Narrative:     The following orders were created for panel order CBC/DIFF.  Procedure                               Abnormality         Status                     ---------                               -----------         ------                     CBC WITH NGEX[[id number]]                Abnormal            Final result                 Please view results for these tests on the individual orders.   URINALYSIS, MACROSCOPIC AND MICROSCOPIC W/CULTURE REFLEX    Narrative:     The following orders were created for panel order URINALYSIS, MACROSCOPIC AND MICROSCOPIC W/CULTURE REFLEX.  Procedure                               Abnormality         Status                     ---------                               -----------         ------  URINALYSIS, MACROSCOPIC[[id number]]      Abnormal            Final result               URINALYSIS, MICROSCOPIC[[id number]]      Abnormal            Final result                 Please view results for these tests on the individual orders.       CT ABDOMEN PELVIS W IV CONTRAST   Final Result by Edi, Radresults In (09/01 1610)   Diverticulitis pattern with the findings concerning for localized perforation with a contained abscess adjacent to  the colon wall new since the prior exam.          One or more dose reduction techniques were used (e.g., Automated exposure control, adjustment of the mA and/or kV according to patient size, use of iterative reconstruction technique).         Radiologist location ID: [id number]             Medical Decision Making          Medical Decision Making  Patient given normal saline 1 L IV along with Zofran 4 mg IV.  CBC and CMP were unremarkable.  Urinalysis unremarkable.  Lactic acid 0.7, magnesium 2.0, lipase 22.   CT of abdomen pelvis shows diverticulitis with what appears to be a perforation with abscess.  Patient does have penicillin allergy.  Patient ordered Invanz 1 g IV.  Patient also ordered fentanyl 50 mcg IV  I have spoken with Dr. Donnal Debar who recommended we admit to the hospitalist service with a consult to Interventional Radiology on Tuesday morning.  Dr. Donnal Debar will be available for surgical consult.  I have spoken with Dr. Patrica Duel.  Patient be admitted to the hospitalist service.    Amount and/or Complexity of Data Reviewed  Labs: ordered. Decision-making details documented in ED Course.  Radiology: ordered and independent interpretation performed. Decision-making details documented in ED Course.    Risk  Prescription drug management.  Parenteral controlled substances.  Decision regarding hospitalization.  Risk Details: 20 minutes of direct patient care, 10 minutes in consultation with the patient, 10 minutes in consultation with surgeon and hospitalist    Critical Care  Total time providing critical care: 40 minutes        ED Course as of 03/21/23 0730   Sun Mar 21, 2023   0516 CBC unremarkable   0554 CMP unremarkable.  Magnesium 2.0, lipase 22   0606 Lactic 0.7   0627 TSH in normal range   0706 CT of abdomen pelvis with IV contrast:FINDINGS:  Lung bases: Small blebs and basilar atelectasis, otherwise no acute findings     Liver:   Hepatic steatosis     Gallbladder:   Unremarkable.     Spleen:    Unremarkable.     Pancreas:   Stable appearance     Adrenals:   Unremarkable.     Kidneys:   Unremarkable.     Bladder:  There is a tiny foci of gas along the anterior bladder wall, correlate for any catheterization. Correlate with urinalysis to rule out infection     Uterus and Adnexa:  Prior hysterectomy.  Adnexal regions are unremarkable. .     Bowel:   At the level of the descending colon near the junction with the sigmoid, there is wall thickening and pericolonic mesenteric inflammatory disease  with phlegmon. This is the same region of the previous diverticulitis pattern. On this exam there is a questionable organizing anteromedial contained abscess developing measuring approximately 3.2 by 2.8 x 2.4 cm. May represent localized rupture     Appendix:  Surgical clip, may be surgically absent     Lymph nodes:  No suspicious lymph node enlargement.     Vasculature:   Major vascular structures are unremarkable.      Peritoneum / Retroperitoneum: No large volume ascites. No free air below the anterior abdominal wall     Bones:   No acute bony findings        IMPRESSION:  Diverticulitis pattern with the findings concerning for localized perforation with a contained abscess adjacent to the colon wall new since the prior exam.         0706 Temperature: 36.2 C (97.1 F)         Medications Administered in the ED   ertapenem (INVanz) 1,000 mg in NS 100 mL IVPB minibag (has no administration in time range)   NS bolus infusion 1,000 mL (0 mL Intravenous Stopped 03/21/23 0719)   ondansetron (ZOFRAN) 2 mg/mL injection (4 mg Intravenous Given 03/21/23 0539)   HYDROcodone-acetaminophen (NORCO) 5-325 mg per tablet (1 Tablet Oral Given 03/21/23 0539)   iohexol (OMNIPAQUE 350) infusion (100 mL Intravenous Given 03/21/23 0647)   fentaNYL (SUBLIMAZE) 50 mcg/mL injection (50 mcg Intravenous Given 03/21/23 1610)       Patient will be admitted to the  service for further workup and management.    Disposition: Admitted               Clinical  Impression   Diverticulitis of colon with perforation (Primary)         Current Discharge Medication List          R.A. Santiago Bumpers, DO  Department of Emergency Medicine

## 2023-03-21 NOTE — H&P (Signed)
MEDICINE [facility]    HOSPITALIST H&P    [facility] 47 y.o. female ED20/ED20   Date of Service: 03/21/2023    Date of Admission:  03/21/2023   PCP: [facility] Code Status:Prior       Chief Complaint:  " Abdominal Pain "    HPI:     Caitlyn Gomez is a 47 y.o. female who had concerns including Abdominal Pain.  Patient is a 47 year old female who presented to the emergency room with left lower quadrant abdominal pain for 1 week.  She described the pain as sharp and burning in nature.  She rates it as a 9/10.  There are no alleviating nor aggravating factors.  She also admitted to having some nausea and vomiting.  She was also states that she was occasionally had some bloody diarrhea. The patient as recently discharged from the hospital on March 17, 2023.  She was treated for diverticulitis during that stay.  Patient was discharged on levofloxacin 750 mg daily for 10 days and metronidazole 500 mg 3 times a day for 10 days.  She admits to taking these medications and denies missing any doses.  She also states she was seen at [facility] 2 days ago for same.  She states they wanted to admit her at that time; but she declined.  Heart rate is 88, respiratory rate is 21, oxygen saturation 98% on room air.  Patient was afebrile at 97.1.   At the timed of my examination she was still having left lower quadrant abdominal pain.  She admits to having several bouts of diverticulitis in the past.  She is agreeable to drainage of abscess by IR when available.  Eager to have clear liquids started.            ED medications:   Medications Administered in the ED   NS bolus infusion 1,000 mL (0 mL Intravenous Stopped 03/21/23 0719)   ondansetron (ZOFRAN) 2 mg/mL injection (4 mg Intravenous Given 03/21/23 0539)   HYDROcodone-acetaminophen (NORCO) 5-325 mg per tablet (1 Tablet Oral Given 03/21/23 0539)   iohexol (OMNIPAQUE 350) infusion (100 mL Intravenous Given 03/21/23 0647)    ertapenem (INVanz) 1,000 mg in NS 100 mL IVPB minibag (has no administration in time range)   fentaNYL (SUBLIMAZE) 50 mcg/mL injection (50 mcg Intravenous Given 03/21/23 0714)         PMHx:    Past Medical History:   Diagnosis Date    A-fib (CMS HCC)     Dr. Kathie Rhodes. [facility]    Abnormal Pap smear     Anxiety     Arthropathy     Asthma     Blastoma (CMS HCC)     ears    Brain tumor (benign) (CMS HCC)     Cervical myelopathy (CMS HCC)     Chiari I malformation (CMS HCC) 2021    Chronic obstructive airway disease (CMS HCC)     Complex regional pain syndrome I     Coronary artery disease     Depression     Depression 10/05/2021    Dysplasia of cervix     Dysrhythmias     a fib    Ectopic pregnancy     Fibromyalgia     GERD (gastroesophageal reflux disease)     controlled with med    History of anesthesia complications     aspirated for scope last time    History of cervical cancer  HTN (hypertension)     HTN (hypertension) 10/05/2021    Hyperlipidemia     NAFLD (nonalcoholic fatty liver disease)     Neck mass     PCOS (polycystic ovarian syndrome)     Radiculopathy     Smoking addiction     Stroke (CMS [facility])     TIA- [place] - 2016 - (left facial droop and left arm weakness)    Thyroid disorder     nodule    Thyroid nodule     Varicosities         PSHx:   Past Surgical History:   Procedure Laterality Date    BLADDER SURGERY  2023    mesh placed    CYSTOSCOPY  11/04/2022    cystoscopy, retropubic mid urethral sling    HX APPENDECTOMY      HX DILATION AND CURETTAGE      HX ENDOMETRIAL ABLATION      HX HEART CATHETERIZATION      stent    HX HYSTERECTOMY      HX TONSILLECTOMY      HX TUBAL LIGATION            Allergies:    Allergies   Allergen Reactions    Bee Venom Protein (Honey Bee) Anaphylaxis    Hymenoptera Allergenic Extract      Other reaction(s): UNKNOWN    Penicillins Hives/ Urticaria and Rash     Other reaction(s): Rash, Skin Irritation  Rash and swellling      Sulfa (Sulfonamides) Rash and Hives/ Urticaria     Other  Reaction(s): Rash-Raised    Rash and swelling    Amoxicillin Rash    Social History  Social History     Tobacco Use    Smoking status: Every Day     Current packs/day: 1.00     Average packs/day: 1 pack/day for 27.7 years (27.7 ttl pk-yrs)     Types: Cigarettes     Start date: 1997    Smokeless tobacco: Never   Vaping Use    Vaping status: Never Used   Substance Use Topics    Alcohol use: Not Currently     Alcohol/week: 0.8 standard drinks of alcohol     Types: 1 Glasses of wine per week     Comment: 2-3 times a month;    Drug use: Never       Family History  Family Medical History:       Problem Relation (Age of Onset)    Colon Cancer Maternal Grandfather    Colon Polyps Maternal Grandmother    Diabetes Maternal Grandmother, Paternal Aunt    Healthy Mother    Heart Disease Father    Hypertension (High Blood Pressure) Father    Inflammatory Bowel Dz Maternal cousin    Stomach Cancer Maternal Aunt    Sudden Death no cause Other               Home Meds:      Prior to Admission medications    Medication Sig Start Date End Date Taking? Authorizing Provider   Ernestina Patches 62.5-25 mcg/actuation Inhalation oral diskus inhaler Take 1 Puff by inhalation Once a day 01/05/23  Yes Provider, Historical   apixaban (ELIQUIS) 5 mg Oral Tablet Take 1 Tablet (5 mg total) by mouth Twice daily   Yes Provider, Historical   atorvastatin (LIPITOR) 40 mg Oral Tablet Take 1 Tablet (40 mg total) by mouth Every evening   Yes Provider, Historical  ipratropium-albuterol 0.5 mg-3 mg(2.5 mg base)/3 mL Solution for Nebulization Take 3 mL by nebulization Four times a day for 7 days  Patient taking differently: Take 3 mL by nebulization Four times a day as needed 07/30/22 03/21/23 Yes Roma Kayser, APRN   levoFLOXacin (LEVAQUIN) 750 mg Oral Tablet Take 1 Tablet (750 mg total) by mouth Once a day for 10 days 03/17/23 03/27/23 Yes Ebbie Ridge, DO   metoprolol succinate (TOPROL-XL) 50 mg Oral Tablet Sustained Release 24 hr Take 1 Tablet (50 mg  total) by mouth Once a day 04/16/20  Yes Provider, Historical   metroNIDAZOLE (FLAGYL) 500 mg Oral Tablet Take 1 Tablet (500 mg total) by mouth Three times a day for 10 days 03/17/23 03/27/23 Yes Ebbie Ridge, DO   nitroGLYCERIN (NITROSTAT) 0.4 mg Sublingual Tablet, Sublingual Place 1 Tablet (0.4 mg total) under the tongue Every 5 minutes as needed for Chest pain for 3 doses over 15 minutes   Yes Provider, Historical   omeprazole (PRILOSEC) 40 mg Oral Capsule, Delayed Release(E.C.) Take 1 Capsule (40 mg total) by mouth Twice daily  Patient taking differently: Take 1 Capsule (40 mg total) by mouth Once a day 04/12/20  Yes Lennox Grumbles, DO   ondansetron (ZOFRAN ODT) 4 mg Oral Tablet, Rapid Dissolve Take 1 Tablet (4 mg total) by mouth Every 8 hours as needed for Nausea/Vomiting 12/16/22  Yes Roma Kayser, APRN   PROAIR HFA 90 mcg/actuation Inhalation oral inhaler Take 1 Puff by inhalation Every 4 hours as needed 07/17/21  Yes Trey Sailors, MD   diclofenac sodium (VOLTAREN) 75 mg Oral Tablet, Delayed Release (E.C.) Take 1 Tablet (75 mg total) by mouth Twice daily 11/30/22 03/16/23  Ellis Savage, DO   fluticasone propion-salmeteroL (ADVAIR) 230-21 mcg/actuation Inhalation oral inhaler Take 2 Puffs by inhalation Twice daily 07/17/21 03/17/23  Trey Sailors, MD   phenazopyridine (PYRIDIUM) 200 mg Oral Tablet Take 1 Tablet (200 mg total) by mouth Three times a day 11/30/22 03/16/23  Mathis Fare A, DO          ROS:   General: No fever or chills. No weight changes, fatigue, weakness.   HEENT: No headaches, dizziness, changes in vision, changes in hearing, or difficulty swallowing.    Skin:  No rashes, erythema or bruises.   Cardiac: No chest pain, palpitations, or arrhythmia.    Respiratory: No shortness of breath, cough, or wheezing.  GI: No nausea or vomiting. No abdominal pain.   Urinary: No dysuria, hematuria, or change in frequency.    Vascular: No edema.     Musculoskeletal: No muscle weakness, pain, or  decreased range of motion.   Neurologic: No loss of sensation, numbness or tingling.   Endocrine: No heat or cold intolerance or polydipsia.   Psychiatric: No insomnia, depression or anxiety.      Results for orders placed or performed during the hospital encounter of 03/21/23 (from the past 24 hour(s))   URINALYSIS, MACROSCOPIC   Result Value Ref Range    COLOR Yellow Colorless, Light Yellow, Yellow    APPEARANCE Turbid (A) Clear    SPECIFIC GRAVITY 1.020 1.002 - 1.030    PH 6.5 5.0 - 9.0    LEUKOCYTES 75 (A) Negative, 100  WBCs/uL    NITRITE Negative Negative    PROTEIN 10 Negative, 10 , 20  mg/dL    GLUCOSE Negative Negative, 30  mg/dL    KETONES Negative Negative, Trace mg/dL    BILIRUBIN Negative Negative, 0.5 mg/dL  BLOOD Negative Negative, 0.03 mg/dL    UROBILINOGEN Normal Normal mg/dL   URINALYSIS, MICROSCOPIC   Result Value Ref Range    BACTERIA Negative Negative /hpf    MUCOUS Occasional Rare, Occasional, Few /hpf    RBCS 3 <4 /hpf    WBCS 4 <6 /hpf    SQUAMOUS EPITHELIAL 29 (H) <28 /hpf   COMPREHENSIVE METABOLIC PANEL, NON-FASTING   Result Value Ref Range    SODIUM 137 136 - 145 mmol/L    POTASSIUM 3.8 3.5 - 5.1 mmol/L    CHLORIDE 103 98 - 107 mmol/L    CO2 TOTAL 27 21 - 31 mmol/L    ANION GAP 7 4 - 13 mmol/L    BUN 8 7 - 25 mg/dL    CREATININE 4.40 1.02 - 1.30 mg/dL    BUN/CREA RATIO 9 6 - 22    ESTIMATED GFR 83 >59 mL/min/1.73m^2    ALBUMIN 3.5 3.5 - 5.7 g/dL    CALCIUM 8.8 8.6 - 72.5 mg/dL    GLUCOSE 96 74 - 366 mg/dL    ALKALINE PHOSPHATASE 60 34 - 104 U/L    ALT (SGPT) 13 7 - 52 U/L    AST (SGOT) 10 (L) 13 - 39 U/L    BILIRUBIN TOTAL 0.5 0.3 - 1.0 mg/dL    PROTEIN TOTAL 7.2 6.4 - 8.9 g/dL    ALBUMIN/GLOBULIN RATIO 0.9 0.8 - 1.4    OSMOLALITY, CALCULATED 272 270 - 290 mOsm/kg    CALCIUM, CORRECTED 9.2 8.9 - 10.8 mg/dL    GLOBULIN 3.7 2.9 - 5.4   LACTIC ACID LEVEL W/ REFLEX FOR LEVEL >2.0   Result Value Ref Range    LACTIC ACID 0.7 0.5 - 2.2 mmol/L   LIPASE   Result Value Ref Range    LIPASE 22 11 -  82 U/L   MAGNESIUM   Result Value Ref Range    MAGNESIUM 2.0 1.9 - 2.7 mg/dL   THYROID STIMULATING HORMONE (SENSITIVE TSH)   Result Value Ref Range    TSH 4.526 0.450 - 5.330 uIU/mL   CBC WITH DIFF   Result Value Ref Range    WBC 11.4 3.8 - 11.8 x10^3/uL    RBC 4.57 3.63 - 4.92 x10^6/uL    HGB 13.3 10.9 - 14.3 g/dL    HCT 44.0 34.7 - 42.5 %    MCV 87.9 75.5 - 95.3 fL    MCH 29.1 24.7 - 32.8 pg    MCHC 33.1 32.3 - 35.6 g/dL    RDW 95.6 38.7 - 56.4 %    PLATELETS 287 140 - 440 x10^3/uL    MPV 9.5 7.9 - 10.8 fL    NEUTROPHIL % 72 43 - 77 %    LYMPHOCYTE % 17 16 - 44 %    MONOCYTE % 10 5 - 13 %    EOSINOPHIL % 1 1 - 7 %    BASOPHIL % 1 0 - 1 %    NEUTROPHIL # 8.20 (H) 1.85 - 7.80 x10^3/uL    LYMPHOCYTE # 1.90 1.00 - 3.00 x10^3/uL    MONOCYTE # 1.20 (H) 0.30 - 1.00 x10^3/uL    EOSINOPHIL # 0.10 0.00 - 0.50 x10^3/uL    BASOPHIL # 0.10 0.00 - 0.10 x10^3/uL   C-REACTIVE PROTEIN (CRP)   Result Value Ref Range    C-REACTIVE PROTEIN (CRP) 11.1 (H) 0.1 - 0.5 mg/dL          Physical:  Filed Vitals:    03/21/23 0700 03/21/23  0745 03/21/23 0830 03/21/23 0930   BP: 118/75 114/80 120/74 119/75   Pulse:  69 68 64   Resp:  15 18 18    Temp:       SpO2: 91% 94% 96% 97%      General: Patient is alert and oriented to person, place, and time. No acute distress. Communicates appropriately.   Head: Normocephalic and atraumatic.    Eyes: Pupils equally round and react to light and accommodate. Extraocular movements intact.  Conjunctiva normal. Sclerae are normal.    Nose: Nasal passages clear. Mucosa moist.    Throat: Moist oral mucosa. No erythema or exudate of the pharynx. Clear oropharynx.    Neck: Supple. No cervical lymphadenopathy or supraclavicular nodes detected. Trachea midline   Heart: Regular rate and rhythm. S1 & S2 present. No S3 or S4. No rubs, gallops, or murmurs appreciated.  Radial and dorsalis pedis pulses +2/4 bilaterally.  Brisk capillary refill.    Lungs: Clear to auscultation bilaterally with no wheezes or rales. Equal  chest excursion.  No conversational dyspnea. No respiratory distress noted.   Abdomen: Soft, nontender, nondistended belly. Bowel sounds are present in all four quadrants. No rigidity.  No guarding.  No ascites.   Extremities: No edema, cyanosis, or clubbing. Grossly moves all extremities.    Skin: Warm and dry without lesions. No ecchymosis noted.    Neurologic: Cranial nerves II through XII are grossly intact. Sensation to light touch is intact. Strength 5/5 in upper extremities and lower extremities bilaterally.    Genitourinary:  No urinary incontinence or Foley catheter   Psychiatric: Judgment and insight are intact. Mood and affect are appropriate for the situation.         Assessments:  Active Hospital Problems   (*Primary Problem)    Diagnosis    *Diverticulitis of colon with perforation     Diverticulitis of colon with perforation  Chronic atrial fibrillation  Chronic COPD    Will treat patient with IV ertapenem.  Will order CT guided drainage of diverticular abscess.  Continue on clear liquid diet.  Will make NPO after midnight.  Hold apixaban until after procedure.  Continue home metoprolol for atrial fibrillation and home medications for COPD.      Code status: Prior  DVT prophylaxis: scd    Diet: clear, NPO after midnight.           Linus Orn, DO    [facility] MEDICINE HOSPITALIST

## 2023-03-21 NOTE — ED Nurses Note (Addendum)
Pt presents to the ER with c/o worsening abdominal pain. Pt was seen here in the ER four days ago for same complaint and diagnosed with diverticulitis. Pt is currently taking flagyl and Levaquin for UTI. Pt states she was seen at [facility] ER for same issue. Pt currently rates abdominal pain 9/10. Pt placed on BP and pulse ox monitors. Call light within reach. Awaiting further orders.

## 2023-03-21 NOTE — ED Triage Notes (Signed)
States she was seen here and diagnosed with diverticulitis a week ago here. Told to come back if worse. Seen at [facility] ER for same symptoms. Worsening abdominal pain since that ER visit. Tylenol at 2000.

## 2023-03-21 NOTE — ED Nurses Note (Signed)
Pt placed in hospital bed for comfort. At this time pt is holding in ED and awaiting a bed for admission.

## 2023-03-21 NOTE — Consults (Signed)
Caitlyn Gomez  General Surgery  Consultation    Date of Service:  03/21/2023  Caitlyn Gomez, 47 y.o. female  Date of Admission:  03/21/2023  Date of Birth:  [birth year]  PCP: [facility]    Reason for Consultation:  Diverticulitis    HPI:  Caitlyn Gomez is a 47 y.o. White female whom I was asked to see for consultation regarding diverticulitis.  The patient presented to the emergency room with left lower abdominal pain for the past week.  The patient was had several bouts of diverticulitis of the past 6 months.  She was actually even hospitalized recently in [place] with similar complaints.  CT scan of the abdomen and pelvis shows evidence diverticulitis with phlegmon and a 3.2 x 2.8 x 2.4 cm abscess.    Past Medical History:   Diagnosis Date    A-fib (CMS HCC)     Dr. Kathie Rhodes. [facility]    Abnormal Pap smear     Anxiety     Arthropathy     Asthma     Blastoma (CMS HCC)     ears    Brain tumor (benign) (CMS HCC)     Cervical myelopathy (CMS HCC)     Chiari I malformation (CMS HCC) 2021    Chronic obstructive airway disease (CMS HCC)     Complex regional pain syndrome I     Coronary artery disease     Depression     Depression 10/05/2021    Dysplasia of cervix     Dysrhythmias     a fib    Ectopic pregnancy     Fibromyalgia     GERD (gastroesophageal reflux disease)     controlled with med    History of anesthesia complications     aspirated for scope last time    History of cervical cancer     HTN (hypertension)     HTN (hypertension) 10/05/2021    Hyperlipidemia     NAFLD (nonalcoholic fatty liver disease)     Neck mass     PCOS (polycystic ovarian syndrome)     Radiculopathy     Smoking addiction     Stroke (CMS [facility])     TIA- [place] - 2016 - (left facial droop and left arm weakness)    Thyroid disorder     nodule    Thyroid nodule     Varicosities       Past Surgical History:   Procedure Laterality Date    BLADDER SURGERY  2023    mesh placed    CYSTOSCOPY  11/04/2022    cystoscopy,  retropubic mid urethral sling    HX APPENDECTOMY      HX DILATION AND CURETTAGE      HX ENDOMETRIAL ABLATION      HX HEART CATHETERIZATION      stent    HX HYSTERECTOMY      HX TONSILLECTOMY      HX TUBAL LIGATION        Social History     Tobacco Use    Smoking status: Every Day     Current packs/day: 1.00     Average packs/day: 1 pack/day for 27.7 years (27.7 ttl pk-yrs)     Types: Cigarettes     Start date: 1997    Smokeless tobacco: Never   Vaping Use    Vaping status: Never Used   Substance Use Topics    Alcohol use: Not Currently  Alcohol/week: 0.8 standard drinks of alcohol     Types: 1 Glasses of wine per week     Comment: 2-3 times a month;    Drug use: Never       Family Medical History:       Problem Relation (Age of Onset)    Colon Cancer Maternal Grandfather    Colon Polyps Maternal Grandmother    Diabetes Maternal Grandmother, Paternal Aunt    Healthy Mother    Heart Disease Father    Hypertension (High Blood Pressure) Father    Inflammatory Bowel Dz Maternal cousin    Stomach Cancer Maternal Aunt    Sudden Death no cause Other           Medications Prior to Admission       Prescriptions    ANORO ELLIPTA 62.5-25 mcg/actuation Inhalation oral diskus inhaler    Take 1 Puff by inhalation Once a day    apixaban (ELIQUIS) 5 mg Oral Tablet    Take 1 Tablet (5 mg total) by mouth Twice daily    atorvastatin (LIPITOR) 40 mg Oral Tablet    Take 1 Tablet (40 mg total) by mouth Every evening    ipratropium-albuterol 0.5 mg-3 mg(2.5 mg base)/3 mL Solution for Nebulization    Take 3 mL by nebulization Four times a day for 7 days    Patient taking differently:  Take 3 mL by nebulization Four times a day as needed    levoFLOXacin (LEVAQUIN) 750 mg Oral Tablet    Take 1 Tablet (750 mg total) by mouth Once a day for 10 days    metoprolol succinate (TOPROL-XL) 50 mg Oral Tablet Sustained Release 24 hr    Take 1 Tablet (50 mg total) by mouth Once a day    metroNIDAZOLE (FLAGYL) 500 mg Oral Tablet    Take 1 Tablet (500  mg total) by mouth Three times a day for 10 days    nitroGLYCERIN (NITROSTAT) 0.4 mg Sublingual Tablet, Sublingual    Place 1 Tablet (0.4 mg total) under the tongue Every 5 minutes as needed for Chest pain for 3 doses over 15 minutes    omeprazole (PRILOSEC) 40 mg Oral Capsule, Delayed Release(E.C.)    Take 1 Capsule (40 mg total) by mouth Twice daily    Patient taking differently:  Take 1 Capsule (40 mg total) by mouth Once a day    ondansetron (ZOFRAN ODT) 4 mg Oral Tablet, Rapid Dissolve    Take 1 Tablet (4 mg total) by mouth Every 8 hours as needed for Nausea/Vomiting    PROAIR HFA 90 mcg/actuation Inhalation oral inhaler    Take 1 Puff by inhalation Every 4 hours as needed           Allergies   Allergen Reactions    Bee Venom Protein (Honey Bee) Anaphylaxis    Hymenoptera Allergenic Extract      Other reaction(s): UNKNOWN    Penicillins Hives/ Urticaria and Rash     Other reaction(s): Rash, Skin Irritation  Rash and swellling      Sulfa (Sulfonamides) Rash and Hives/ Urticaria     Other Reaction(s): Rash-Raised    Rash and swelling    Amoxicillin Rash          Patient Vitals for the past 24 hrs:   BP Temp Pulse Resp SpO2 Height Weight   03/21/23 1922 121/71 36.9 C (98.5 F) 79 19 94 % 1.676 m (5\' 6" ) (!) 142 kg (312 lb 11.2 oz)  03/21/23 1907 -- -- 20 -- -- -- --   03/21/23 1709 125/76 -- 70 16 100 % -- --   03/21/23 1330 108/77 -- 67 17 98 % -- --   03/21/23 1300 109/78 -- 78 17 98 % -- --   03/21/23 1245 107/74 -- 65 16 95 % -- --   03/21/23 1200 108/72 -- 66 (!) 11 99 % -- --   03/21/23 1107 -- -- -- 15 98 % -- --   03/21/23 1100 116/72 -- 62 18 97 % -- --   03/21/23 1045 119/78 -- 65 19 98 % -- --   03/21/23 1030 114/79 -- 69 (!) 9 98 % -- --   03/21/23 1015 (!) 109/59 -- 65 19 97 % -- --   03/21/23 0930 119/75 -- 64 18 97 % -- --   03/21/23 0830 120/74 -- 68 18 96 % -- --   03/21/23 0745 114/80 -- 69 15 94 % -- --   03/21/23 0700 118/75 -- -- -- 91 % -- --   03/21/23 0630 -- -- -- -- 100 % -- --    03/21/23 0615 -- -- -- -- 96 % -- --   03/21/23 0600 -- -- -- -- 93 % -- --   03/21/23 0545 -- -- -- -- 97 % -- --   03/21/23 0515 -- -- -- -- 95 % -- --   03/21/23 0500 105/70 -- -- -- 97 % -- --   03/21/23 0445 101/65 -- -- -- -- -- --   03/21/23 0418 -- 36.2 C (97.1 F) 88 (!) 21 98 % -- --          General: appropriate for age. in no acute distress.    Vital signs are present above and have been reviewed by me     HEENT: Atraumatic, Normocephalic. PERRLA. EOMI. Nose clear. Throat clear    Lungs: Nonlabored breathing with symmetric expansion. Clear to auscultation bilaterally    Heart:Regular wth respect to rate and rythmn.    Abdomen:Soft.  Tender in left lower quadrant to deep palpation with localized guarding but no rebound or peritoneal signs.. Nondistended and otherwise benign    Extremities: Grossly normal with no major deformities.    Neuro: Grossly normal motor and sensory function. CN's II through XII intact.    Psychiatric: Alert and oriented to person, place, and time. affect appropriate    Laboratory Data:     Results for orders placed or performed during the hospital encounter of 03/21/23 (from the past 24 hour(s))   URINALYSIS, MACROSCOPIC   Result Value Ref Range    COLOR Yellow Colorless, Light Yellow, Yellow    APPEARANCE Turbid (A) Clear    SPECIFIC GRAVITY 1.020 1.002 - 1.030    PH 6.5 5.0 - 9.0    LEUKOCYTES 75 (A) Negative, 100  WBCs/uL    NITRITE Negative Negative    PROTEIN 10 Negative, 10 , 20  mg/dL    GLUCOSE Negative Negative, 30  mg/dL    KETONES Negative Negative, Trace mg/dL    BILIRUBIN Negative Negative, 0.5 mg/dL    BLOOD Negative Negative, 0.03 mg/dL    UROBILINOGEN Normal Normal mg/dL   URINALYSIS, MICROSCOPIC   Result Value Ref Range    BACTERIA Negative Negative /hpf    MUCOUS Occasional Rare, Occasional, Few /hpf    RBCS 3 <4 /hpf    WBCS 4 <6 /hpf    SQUAMOUS EPITHELIAL 29 (H) <28 /hpf   COMPREHENSIVE  METABOLIC PANEL, NON-FASTING   Result Value Ref Range    SODIUM 137 136  - 145 mmol/L    POTASSIUM 3.8 3.5 - 5.1 mmol/L    CHLORIDE 103 98 - 107 mmol/L    CO2 TOTAL 27 21 - 31 mmol/L    ANION GAP 7 4 - 13 mmol/L    BUN 8 7 - 25 mg/dL    CREATININE 4.01 0.27 - 1.30 mg/dL    BUN/CREA RATIO 9 6 - 22    ESTIMATED GFR 83 >59 mL/min/1.42m^2    ALBUMIN 3.5 3.5 - 5.7 g/dL    CALCIUM 8.8 8.6 - 25.3 mg/dL    GLUCOSE 96 74 - 664 mg/dL    ALKALINE PHOSPHATASE 60 34 - 104 U/L    ALT (SGPT) 13 7 - 52 U/L    AST (SGOT) 10 (L) 13 - 39 U/L    BILIRUBIN TOTAL 0.5 0.3 - 1.0 mg/dL    PROTEIN TOTAL 7.2 6.4 - 8.9 g/dL    ALBUMIN/GLOBULIN RATIO 0.9 0.8 - 1.4    OSMOLALITY, CALCULATED 272 270 - 290 mOsm/kg    CALCIUM, CORRECTED 9.2 8.9 - 10.8 mg/dL    GLOBULIN 3.7 2.9 - 5.4   LACTIC ACID LEVEL W/ REFLEX FOR LEVEL >2.0   Result Value Ref Range    LACTIC ACID 0.7 0.5 - 2.2 mmol/L   LIPASE   Result Value Ref Range    LIPASE 22 11 - 82 U/L   MAGNESIUM   Result Value Ref Range    MAGNESIUM 2.0 1.9 - 2.7 mg/dL   THYROID STIMULATING HORMONE (SENSITIVE TSH)   Result Value Ref Range    TSH 4.526 0.450 - 5.330 uIU/mL   CBC WITH DIFF   Result Value Ref Range    WBC 11.4 3.8 - 11.8 x10^3/uL    RBC 4.57 3.63 - 4.92 x10^6/uL    HGB 13.3 10.9 - 14.3 g/dL    HCT 40.3 47.4 - 25.9 %    MCV 87.9 75.5 - 95.3 fL    MCH 29.1 24.7 - 32.8 pg    MCHC 33.1 32.3 - 35.6 g/dL    RDW 56.3 87.5 - 64.3 %    PLATELETS 287 140 - 440 x10^3/uL    MPV 9.5 7.9 - 10.8 fL    NEUTROPHIL % 72 43 - 77 %    LYMPHOCYTE % 17 16 - 44 %    MONOCYTE % 10 5 - 13 %    EOSINOPHIL % 1 1 - 7 %    BASOPHIL % 1 0 - 1 %    NEUTROPHIL # 8.20 (H) 1.85 - 7.80 x10^3/uL    LYMPHOCYTE # 1.90 1.00 - 3.00 x10^3/uL    MONOCYTE # 1.20 (H) 0.30 - 1.00 x10^3/uL    EOSINOPHIL # 0.10 0.00 - 0.50 x10^3/uL    BASOPHIL # 0.10 0.00 - 0.10 x10^3/uL   C-REACTIVE PROTEIN (CRP)   Result Value Ref Range    C-REACTIVE PROTEIN (CRP) 11.1 (H) 0.1 - 0.5 mg/dL       Imaging Studies:    CT ABDOMEN PELVIS W IV CONTRAST   Final Result by Edi, Radresults In (09/01 3295)   Diverticulitis pattern  with the findings concerning for localized perforation with a contained abscess adjacent to the colon wall new since the prior exam.          One or more dose reduction techniques were used (e.g., Automated exposure control, adjustment of the mA and/or kV according to patient  size, use of iterative reconstruction technique).         Radiologist location ID: [id number]              Assessment/Plan:  Active Hospital Problems    Diagnosis    Primary Problem: Diverticulitis of colon with perforation       Diverticulitis, Hinchey class 2    I discussed the findings with the patient and recommended that the treatment of choice for Hinchey class is with IV antibiotics, bowel rest and possible interventional radiology CT-guided drainage if feasible.  The abscess is fairly small at 3.2 cm and this may be amenable to just bowel rest and IV antibiotics.    Nevertheless, no immediate surgical intervention is indicated or recommended.    The patient will need to have a colonoscopy after her diverticulitis episode resolves.    A discussion regarding possible surgical intervention we will be needed as well since she was had increasing frequency and severity of diverticulitis attacks over the past 6 months.  The patient understood the plan of therapy and agreed to that plan.    This note was partially created using voice recognition software and is inherently subject to errors including those of syntax and "sound alike " substitutions which may escape proof reading. In such instances, original meaning may be extrapolated by contextual derivation.    Fidela Juneau, MD, MBA, FACS

## 2023-03-21 NOTE — ED Nurses Note (Signed)
Received report from A. Trussel Charity fundraiser. Assuming care for pt at this time.Pt resting in ED stretcher. No needs expressed at this time.

## 2023-03-21 NOTE — Respiratory Therapy (Signed)
03/21/23 1107   Respiratory   Respiratory WDL WDL   Additional Documentation Aerosol Therapy [facility]    Aerosol Therapy   $ Respiratory Treatment (Resp only) MDI (Initial)   Medications Other (Comment)  (anoro ellipta home med)   Start Time 1105   Stop Time 1120   Duration (minutes) 15 minutes   Treatment Status Given   Route (Aerosol Therapy) mouth piece   Signs of Intolerance (SVN) none   Patient Position (SVN) HOB at 45 degrees   Respiratory Pre/Post-Treatment Assess   SpO2 98 %   Pre-Treatment Heart Rate (beats/min) 73   Pre-Treatment Resp Rate (breaths/min) 17   Post-treatment Heart Rate (beats/min) 72   Post-treatment Resp Rate (breaths/min) 15   BP (pre-treatment) (mmHg) 116/72   MAP (pre-treatment) (mmHg) 86 mmHG   Oxygen Therapy   HR-SpO2 70 bpm   O2 Delivery Source  RA   High Flow Nasal Cannula   Respiratory Rate 15     Patients home Anoro Ellipta used at bedside per order.

## 2023-03-22 LAB — COMPREHENSIVE METABOLIC PANEL, NON-FASTING
ALBUMIN/GLOBULIN RATIO: 1 (ref 0.8–1.4)
ALBUMIN: 3.3 g/dL — ABNORMAL LOW (ref 3.5–5.7)
ALKALINE PHOSPHATASE: 54 U/L (ref 34–104)
ALT (SGPT): 10 U/L (ref 7–52)
ANION GAP: 6 mmol/L (ref 4–13)
AST (SGOT): 9 U/L — ABNORMAL LOW (ref 13–39)
BILIRUBIN TOTAL: 0.5 mg/dL (ref 0.3–1.0)
BUN/CREA RATIO: 7 (ref 6–22)
BUN: 6 mg/dL — ABNORMAL LOW (ref 7–25)
CALCIUM, CORRECTED: 9.1 mg/dL (ref 8.9–10.8)
CALCIUM: 8.5 mg/dL — ABNORMAL LOW (ref 8.6–10.3)
CHLORIDE: 102 mmol/L (ref 98–107)
CO2 TOTAL: 28 mmol/L (ref 21–31)
CREATININE: 0.87 mg/dL (ref 0.60–1.30)
ESTIMATED GFR: 84 mL/min/{1.73_m2} (ref >59)
GLOBULIN: 3.4 (ref 2.9–5.4)
GLUCOSE: 85 mg/dL (ref 74–109)
OSMOLALITY, CALCULATED: 269 mOsm/kg — ABNORMAL LOW (ref 270–290)
POTASSIUM: 4.1 mmol/L (ref 3.5–5.1)
PROTEIN TOTAL: 6.7 g/dL (ref 6.4–8.9)
SODIUM: 136 mmol/L (ref 136–145)

## 2023-03-22 LAB — CBC WITH DIFF
BASOPHIL #: 0 10*3/uL (ref 0.00–0.10)
BASOPHIL %: 0 % (ref 0–1)
EOSINOPHIL #: 0.1 10*3/uL (ref 0.00–0.50)
EOSINOPHIL %: 1 % (ref 1–7)
HCT: 38.2 % (ref 31.2–41.9)
HGB: 12.5 g/dL (ref 10.9–14.3)
LYMPHOCYTE #: 1.7 10*3/uL (ref 1.00–3.00)
LYMPHOCYTE %: 15 % — ABNORMAL LOW (ref 16–44)
MCH: 28.8 pg (ref 24.7–32.8)
MCHC: 32.8 g/dL (ref 32.3–35.6)
MCV: 87.8 fL (ref 75.5–95.3)
MONOCYTE #: 1.5 10*3/uL — ABNORMAL HIGH (ref 0.30–1.00)
MONOCYTE %: 12 % (ref 5–13)
MPV: 9.4 fL (ref 7.9–10.8)
NEUTROPHIL #: 8.5 10*3/uL — ABNORMAL HIGH (ref 1.85–7.80)
NEUTROPHIL %: 71 % (ref 43–77)
PLATELETS: 242 10*3/uL (ref 140–440)
RBC: 4.35 10*6/uL (ref 3.63–4.92)
RDW: 13.8 % (ref 12.3–17.7)
WBC: 11.9 10*3/uL — ABNORMAL HIGH (ref 3.8–11.8)

## 2023-03-22 LAB — C-REACTIVE PROTEIN (CRP): C-REACTIVE PROTEIN (CRP): 14.7 mg/dL — ABNORMAL HIGH (ref 0.1–0.5)

## 2023-03-22 LAB — MAGNESIUM: MAGNESIUM: 2 mg/dL (ref 1.9–2.7)

## 2023-03-22 LAB — PHOSPHORUS: PHOSPHORUS: 4.1 mg/dL (ref 3.7–7.2)

## 2023-03-22 MED ORDER — SODIUM CHLORIDE 0.9 % INTRAVENOUS PIGGYBACK
1.0000 g | Freq: Three times a day (TID) | INTRAVENOUS | Status: DC
Start: 2023-03-22 — End: 2023-03-25
  Administered 2023-03-22: 0 g via INTRAVENOUS
  Administered 2023-03-22 – 2023-03-23 (×3): 1 g via INTRAVENOUS
  Administered 2023-03-23: 0 g via INTRAVENOUS
  Administered 2023-03-23 (×2): 1 g via INTRAVENOUS
  Administered 2023-03-23 – 2023-03-24 (×3): 0 g via INTRAVENOUS
  Administered 2023-03-24 (×3): 1 g via INTRAVENOUS
  Administered 2023-03-24 (×2): 0 g via INTRAVENOUS
  Administered 2023-03-25: 1 g via INTRAVENOUS
  Administered 2023-03-25: 0 g via INTRAVENOUS
  Administered 2023-03-25: 1 g via INTRAVENOUS
  Administered 2023-03-25: 0 g via INTRAVENOUS
  Filled 2023-03-22 (×10): qty 20

## 2023-03-22 MED ORDER — SIMETHICONE 80 MG CHEWABLE TABLET
80.0000 mg | CHEWABLE_TABLET | Freq: Four times a day (QID) | ORAL | Status: DC | PRN
Start: 2023-03-22 — End: 2023-03-25
  Administered 2023-03-22: 80 mg via ORAL
  Filled 2023-03-22: qty 1

## 2023-03-22 MED ORDER — MEROPENEM 1 GRAM INTRAVENOUS SOLUTION
1.0000 g | INTRAVENOUS | Status: AC
Start: 2023-03-22 — End: 2023-03-22
  Administered 2023-03-22: 0 g via INTRAVENOUS
  Administered 2023-03-22: 1 g via INTRAVENOUS
  Filled 2023-03-22: qty 20

## 2023-03-22 NOTE — Progress Notes (Signed)
[facility]    HOSPITALIST PROGRESS NOTE    Caitlyn Gomez  Date of service: 03/22/2023  Date of Admission:  03/21/2023  Hospital Day:  LOS: 1 day     Subjective:   Patient is seen in follow-up for abdominal pain secondary to diverticulitis with abscess.  Patient is still with admits to having abdominal pain that is worse in her lower quadrants.  Patient's feels like she is having quite a bit of gas related pain today.  She denies any fever, chills, night sweats.  Also denies any diarrhea or vomiting.  She does admit to having some nausea.  Once again patient is agreeable to CT-guided drainage of her diverticular abscesses tomorrow by Dr. Azucena Freed if necessary.    A focused review of symptoms was performed and is negative unless specified in HPI/subjective.    Vital Signs:  Filed Vitals:    03/22/23 1013 03/22/23 1405 03/22/23 1437 03/22/23 1545   BP:    105/71   Pulse: 71   75   Resp:  20 20 18    Temp:    36.5 C (97.7 F)   SpO2:    99%        Physical Exam:  GENERAL: No acute distress, Resting comfortably, Alert and oriented x3    SKIN: warm and dry.    CARDIAC: S1 and S2, no murmurs, clicks, or gallops. No S3.    LUNGS: Clear to auscultation bilaterally. No wheezes or rhonchi. No accessory muscle use noted.    GI: Abdomen soft, nontender. Good bowel sounds x 4. No organomegaly appreciated.    GU: No suprapubic tenderness. No costovertebral tenderness.    EXTREMITIES: No pedal edema noted. Distal pulses equal bilaterally.    NEUROLOGICAL: Cranial nerves grossly intact. No gross focal deficit.     Current Medications:  acetaminophen (TYLENOL) tablet, 650 mg, Oral, Q6H PRN  albuterol 90 mcg per inhalation oral inhaler - "Respiratory to administer", 1 Puff, Inhalation, Q4H PRN  atorvastatin (LIPITOR) tablet, 40 mg, Oral, QPM  HYDROcodone-acetaminophen (NORCO) 7.5 mg-325 mg per tablet, 1 Tablet, Oral, Q6H PRN  ipratropium-albuterol 0.5 mg-3 mg(2.5 mg base)/3 mL Solution for  Nebulization, 3 mL, Nebulization, 4x/day PRN  meropenem (MERREM) 1 g in NS 100 mL IVPB minibag, 1 g, Intravenous, Q8H  metoprolol succinate (TOPROL-XL) 24 hr extended release tablet, 50 mg, Oral, Daily  morphine 2 mg/mL injection, 2 mg, Intravenous, Q3H PRN  nicotine (NICODERM CQ) transdermal patch (mg/24 hr), 21 mg, Transdermal, Daily  ondansetron (ZOFRAN) 2 mg/mL injection, 4 mg, Intravenous, Q8H PRN  pantoprazole (PROTONIX) delayed release tablet, 40 mg, Oral, 2x/day AC  umeclidinium-vilanterol (ANORO ELLIPTA) 62.5 mcg-25 mcg inhaler - "Respiratory to administer", 1 Puff, Inhalation, Daily        Labs:  CBC:     11.9* (09/02 0414) \   12.5 (09/02 0414) /   242 (09/02 0414)      / 38.2 (09/02 0414) \          BMP:   136 (09/02 5366) 102 (09/02 0414) 6* (09/02 4403)    /     85 (09/02 0414)   4.1 (09/02 0414) 28 (09/02 0414) 0.87 (09/02 0414) \              Recent Results (from the past 24 hour(s))   COMPREHENSIVE METABOLIC PANEL, NON-FASTING    Collection Time: 03/22/23  4:14 AM   Result Value    ALKALINE PHOSPHATASE 54  ALT (SGPT) 10    AST (SGOT) 9 (L)          Radiology:  CT ABDOMEN PELVIS W IV CONTRAST    Result Date: 03/21/2023  Impression Diverticulitis pattern with the findings concerning for localized perforation with a contained abscess adjacent to the colon wall new since the prior exam. One or more dose reduction techniques were used (e.g., Automated exposure control, adjustment of the mA and/or kV according to patient size, use of iterative reconstruction technique). Radiologist location ID: [id number]       The laboratory results, imaging results and other diagnostic results were reviewed in the EMR.    Assessment/ Plan:   Active Hospital Problems   (*Primary Problem)    Diagnosis    *Diverticulitis of colon with perforation     Diverticulitis of colon with microperforation and abscess  Chronic atrial fibrillation   chronic chronic obstructive pulmonary disease    Patient is due be NPO after  midnight in preparation for CT-guided drainage of diverticular abscess tomorrow by IR.  Patient's Eliquis has been on hold since admission med will be restarted after the procedure.  We will continue the patient on IV meropenem for treatment of the diverticulitis  Continue her home dose of metoprolol for atrial fibrillation.  We will also continue her DuoNebs and Anoro Ellipta for chronic obstructive pulmonary disease.  Any further intervention be determined by the patient's hospital course.     MY ORDERS LAST 24 (24h ago, onward)       Start     Ordered    03/22/23 1630  meropenem (MERREM) 1 g in NS 100 mL IVPB minibag  (meropenem (MERREM) IVPB load & maintenance dose)  EVERY 8 HOURS         03/22/23 0813    03/22/23 0830  meropenem (MERREM) 1 g in NS 100 mL IVPB minibag  (meropenem (MERREM) IVPB load & maintenance dose)  NOW         03/22/23 0813    03/22/23 0530  CBC WITH DIFF  PROCEDURE ONCE         03/22/23 0016    03/21/23 1900  MAINTAIN SEQUENTIAL COMPRESSION DEVICE  (SEQUENTIAL COMPRESSION DEVICE PANEL (MUST KEEP BOTH ORDERS) )  CONTINUOUS         03/21/23 1859    03/21/23 1900  APPLY SEQUENTIAL COMPRESSION DEVICE  (SEQUENTIAL COMPRESSION DEVICE PANEL (MUST KEEP BOTH ORDERS) )  ONE TIME         03/21/23 1859                    DVT/PE Prophylaxis: SCDs/ Venodynes/Impulse boots    Disposition Planning: Home discharge      On the day of the encounter, a total of  35 minutes was spent on this patient encounter including review of historical information, examination, documentation and post-visit activities. The time documented excludes procedural time.     Linus Orn, DO FACP [facility]  03/22/2023  [facility] HOSPITALIST

## 2023-03-23 ENCOUNTER — Ambulatory Visit (HOSPITAL_COMMUNITY): Payer: Self-pay

## 2023-03-23 ENCOUNTER — Inpatient Hospital Stay (HOSPITAL_COMMUNITY): Payer: Medicaid Other

## 2023-03-23 ENCOUNTER — Ambulatory Visit (HOSPITAL_COMMUNITY): Payer: Medicaid Other

## 2023-03-23 DIAGNOSIS — Z720 Tobacco use: Secondary | ICD-10-CM

## 2023-03-23 DIAGNOSIS — R110 Nausea: Secondary | ICD-10-CM

## 2023-03-23 LAB — CBC WITH DIFF
BASOPHIL #: 0.1 10*3/uL (ref 0.00–0.10)
BASOPHIL %: 1 % (ref 0–1)
EOSINOPHIL #: 0.2 10*3/uL (ref 0.00–0.50)
EOSINOPHIL %: 2 % (ref 1–7)
HCT: 41 % (ref 31.2–41.9)
HGB: 13.3 g/dL (ref 10.9–14.3)
LYMPHOCYTE #: 1.4 10*3/uL (ref 1.00–3.00)
LYMPHOCYTE %: 12 % — ABNORMAL LOW (ref 16–44)
MCH: 28.4 pg (ref 24.7–32.8)
MCHC: 32.5 g/dL (ref 32.3–35.6)
MCV: 87.3 fL (ref 75.5–95.3)
MONOCYTE #: 1.3 10*3/uL — ABNORMAL HIGH (ref 0.30–1.00)
MONOCYTE %: 11 % (ref 5–13)
MPV: 9.4 fL (ref 7.9–10.8)
NEUTROPHIL #: 8.3 10*3/uL — ABNORMAL HIGH (ref 1.85–7.80)
NEUTROPHIL %: 74 % (ref 43–77)
PLATELETS: 268 10*3/uL (ref 140–440)
RBC: 4.7 10*6/uL (ref 3.63–4.92)
RDW: 13.6 % (ref 12.3–17.7)
WBC: 11.2 10*3/uL (ref 3.8–11.8)

## 2023-03-23 LAB — BASIC METABOLIC PANEL
ANION GAP: 9 mmol/L (ref 4–13)
BUN/CREA RATIO: 10 (ref 6–22)
BUN: 8 mg/dL (ref 7–25)
CALCIUM: 8.8 mg/dL (ref 8.6–10.3)
CHLORIDE: 99 mmol/L (ref 98–107)
CO2 TOTAL: 27 mmol/L (ref 21–31)
CREATININE: 0.82 mg/dL (ref 0.60–1.30)
ESTIMATED GFR: 90 mL/min/{1.73_m2} (ref >59)
GLUCOSE: 65 mg/dL — ABNORMAL LOW (ref 74–109)
OSMOLALITY, CALCULATED: 267 mOsm/kg — ABNORMAL LOW (ref 270–290)
POTASSIUM: 4.6 mmol/L (ref 3.5–5.1)
SODIUM: 135 mmol/L — ABNORMAL LOW (ref 136–145)

## 2023-03-23 LAB — URINE CULTURE,ROUTINE: URINE CULTURE: >100000 — AB

## 2023-03-23 LAB — C-REACTIVE PROTEIN (CRP): C-REACTIVE PROTEIN (CRP): 20 mg/dL — ABNORMAL HIGH (ref 0.1–0.5)

## 2023-03-23 MED ORDER — HYDROCODONE 5 MG-ACETAMINOPHEN 325 MG TABLET
1.0000 | ORAL_TABLET | Freq: Four times a day (QID) | ORAL | Status: DC | PRN
Start: 2023-03-23 — End: 2023-03-25
  Administered 2023-03-23 – 2023-03-25 (×4): 1 via ORAL
  Filled 2023-03-23 (×5): qty 1

## 2023-03-23 MED ORDER — FENTANYL (PF) 50 MCG/ML INJECTION SOLUTION
INTRAMUSCULAR | Status: AC
Start: 2023-03-23 — End: 2023-03-23
  Filled 2023-03-23: qty 2

## 2023-03-23 MED ORDER — APIXABAN 5 MG TABLET
5.0000 mg | ORAL_TABLET | Freq: Two times a day (BID) | ORAL | Status: DC
Start: 2023-03-23 — End: 2023-03-25
  Administered 2023-03-23 – 2023-03-25 (×4): 5 mg via ORAL
  Filled 2023-03-23 (×4): qty 1

## 2023-03-23 NOTE — Nurses Notes (Incomplete)
Patiemt states that she feels like she injured her shoulder trying to [employer]

## 2023-03-23 NOTE — Nurses Notes (Signed)
Patient states that she feels like she injured her shoulder trying to move herself in bed. FNP Aurther Loft notified. Received an order for xray. Pain medication given.

## 2023-03-23 NOTE — Progress Notes (Signed)
[place] MEDICINE [facility]    HOSPITALIST PROGRESS NOTE    Caitlyn Gomez  Date of service: 03/23/2023  Date of Admission:  03/21/2023  Hospital Day:  LOS: 2 days     Subjective:   Patient is seen in follow-up for abdominal pain secondary to diverticulitis with abscess.  Patient is still with admits to having abdominal pain that is worse in her lower quadrants.  Patient's feels like she is having quite a bit of gas related pain today.  She denies any fever, chills, night sweats.  Also denies any diarrhea or vomiting.  She does admit to having some nausea.  Once again patient is agreeable to CT-guided drainage of her diverticular abscesses tomorrow by Dr. Dortha Schwalbe.    03/23/23  Patient seen and examined.  Admits to feeling better overall today.  Less abdominal pain.  Tolerating clear diet.  Discussed case with Dr. Salina April very small and not able to be safely drained.  Feels patient's issue is more related to the diverticular perforation than an abscess.  Recommends to continue current treatment.  Will reevaluate if symptoms worsen.      A focused review of symptoms was performed and is negative unless specified in HPI/subjective.    Vital Signs:  Filed Vitals:    03/23/23 1300 03/23/23 1519 03/23/23 1741 03/23/23 1920   BP:  116/70  116/79   Pulse:  75  83   Resp: 17 16 17 18    Temp:  36.4 C (97.6 F)  36.9 C (98.4 F)   SpO2:  97%  96%        Physical Exam:  GENERAL: No acute distress, Resting comfortably, Alert and oriented x3    SKIN: warm and dry.    CARDIAC: S1 and S2, no murmurs, clicks, or gallops. No S3.    LUNGS: Clear to auscultation bilaterally. No wheezes or rhonchi. No accessory muscle use noted.    GI: Abdomen soft, nontender. Good bowel sounds x 4. No organomegaly appreciated.    GU: No suprapubic tenderness. No costovertebral tenderness.    EXTREMITIES: No pedal edema noted. Distal pulses equal bilaterally.    NEUROLOGICAL: Cranial nerves grossly intact. No gross focal deficit.      Current Medications:  acetaminophen (TYLENOL) tablet, 650 mg, Oral, Q6H PRN  albuterol 90 mcg per inhalation oral inhaler - "Respiratory to administer", 1 Puff, Inhalation, Q4H PRN  atorvastatin (LIPITOR) tablet, 40 mg, Oral, QPM  HYDROcodone-acetaminophen (NORCO) 5-325 mg per tablet, 1 Tablet, Oral, Q6H PRN  ipratropium-albuterol 0.5 mg-3 mg(2.5 mg base)/3 mL Solution for Nebulization, 3 mL, Nebulization, 4x/day PRN  meropenem (MERREM) 1 g in NS 100 mL IVPB minibag, 1 g, Intravenous, Q8H  metoprolol succinate (TOPROL-XL) 24 hr extended release tablet, 50 mg, Oral, Daily  morphine 2 mg/mL injection, 2 mg, Intravenous, Q3H PRN  nicotine (NICODERM CQ) transdermal patch (mg/24 hr), 21 mg, Transdermal, Daily  ondansetron (ZOFRAN) 2 mg/mL injection, 4 mg, Intravenous, Q8H PRN  pantoprazole (PROTONIX) delayed release tablet, 40 mg, Oral, 2x/day AC  simethicone (MYLICON) chewable tablet, 80 mg, Oral, Q6H PRN  umeclidinium-vilanterol (ANORO ELLIPTA) 62.5 mcg-25 mcg inhaler - "Respiratory to administer", 1 Puff, Inhalation, Daily        Labs:  CBC:     11.2 (09/03 0737) \   13.3 (09/03 0737) /   268 (09/03 0737)      / 41.0 (09/03 0737) \          BMP:   135* (09/03 0737) 99 (  09/03 2130) 8 (09/03 8657)    /     65* (09/03 8469)   4.6 (09/03 6295) 27 (09/03 0737) 0.82 (09/03 0737) \              No results found for this or any previous visit (from the past 24 hour(s)).         Radiology:  No results found.     The laboratory results, imaging results and other diagnostic results were reviewed in the EMR.    Assessment/ Plan:   Active Hospital Problems   (*Primary Problem)    Diagnosis    *Diverticulitis of colon with perforation     Diverticulitis of colon with microperforation and abscess  Chronic atrial fibrillation   chronic chronic obstructive pulmonary disease    Continue IV Merrem for treatment of diverticulitis.  CT guided abscess drainage cancelled due to abscess being too small to drain.   Resume Eliquis for  treatment of atrial fibrillation.   Continue her home dose of metoprolol for atrial fibrillation.  We will also continue her DuoNebs and Anoro Ellipta for chronic obstructive pulmonary disease.  Any further intervention be determined by the patient's hospital course.     MY ORDERS LAST 24 (24h ago, onward)       Start     Ordered    03/23/23 1300  DIET FULL LIQUID Do you want to initiate MNT Protocol? Yes  ALL MEALS         03/23/23 1245    03/23/23 1245  HYDROcodone-acetaminophen (NORCO) 5-325 mg per tablet  EVERY 6 HOURS PRN         03/23/23 1245    03/23/23 0730  IP CONSULT TO PALLIATIVE CARE  ONE TIME        Provider:  (Not yet assigned)    03/23/23 0718    03/23/23 0730  BASIC METABOLIC PANEL  ONE TIME         03/23/23 0718    03/23/23 0730  CBC/DIFF  ONE TIME         03/23/23 0718    03/23/23 0730  C-REACTIVE PROTEIN (CRP)  ONE TIME         03/23/23 0718    03/23/23 0730  CBC WITH DIFF  PROCEDURE ONCE         03/23/23 0718    03/22/23 2000  CONTACT ISOLATION  CONTINUOUS        Comments: Private Room  Wear gown and gloves when entering room  Wear mask and eye protection if exposure anticipated  Prior to transport, notify receiving department of precautions  Provide patient and family education regarding isolation    For the CDC's complete guidelines, see page 34 of [email or web address]      03/22/23 1959                    DVT/PE Prophylaxis: SCDs/ Venodynes/Impulse boots    Disposition Planning: Home discharge      On the day of the encounter, a total of  35 minutes was spent on this patient encounter including review of historical information, examination, documentation and post-visit activities. The time documented excludes procedural time.     Linus Orn, DO FACP [facility]  03/23/2023  [place] MEDICINE HOSPITALIST

## 2023-03-24 DIAGNOSIS — K5720 Diverticulitis of large intestine with perforation and abscess without bleeding: Secondary | ICD-10-CM

## 2023-03-24 DIAGNOSIS — I482 Chronic atrial fibrillation, unspecified: Secondary | ICD-10-CM

## 2023-03-24 DIAGNOSIS — J449 Chronic obstructive pulmonary disease, unspecified: Secondary | ICD-10-CM

## 2023-03-24 DIAGNOSIS — Z79899 Other long term (current) drug therapy: Secondary | ICD-10-CM

## 2023-03-24 DIAGNOSIS — Z7901 Long term (current) use of anticoagulants: Secondary | ICD-10-CM

## 2023-03-24 LAB — COMPREHENSIVE METABOLIC PANEL, NON-FASTING
ALBUMIN/GLOBULIN RATIO: 0.9 (ref 0.8–1.4)
ALBUMIN: 3.4 g/dL — ABNORMAL LOW (ref 3.5–5.7)
ALKALINE PHOSPHATASE: 57 U/L (ref 34–104)
ALT (SGPT): 7 U/L (ref 7–52)
ANION GAP: 9 mmol/L (ref 4–13)
AST (SGOT): 11 U/L — ABNORMAL LOW (ref 13–39)
BILIRUBIN TOTAL: 0.4 mg/dL (ref 0.3–1.0)
BUN/CREA RATIO: 9 (ref 6–22)
BUN: 7 mg/dL (ref 7–25)
CALCIUM, CORRECTED: 9.2 mg/dL (ref 8.9–10.8)
CALCIUM: 8.7 mg/dL (ref 8.6–10.3)
CHLORIDE: 101 mmol/L (ref 98–107)
CO2 TOTAL: 27 mmol/L (ref 21–31)
CREATININE: 0.76 mg/dL (ref 0.60–1.30)
ESTIMATED GFR: 99 mL/min/{1.73_m2} (ref >59)
GLOBULIN: 3.7 (ref 2.9–5.4)
GLUCOSE: 91 mg/dL (ref 74–109)
OSMOLALITY, CALCULATED: 271 mOsm/kg (ref 270–290)
POTASSIUM: 4 mmol/L (ref 3.5–5.1)
PROTEIN TOTAL: 7.1 g/dL (ref 6.4–8.9)
SODIUM: 137 mmol/L (ref 136–145)

## 2023-03-24 LAB — CBC WITH DIFF
BASOPHIL #: 0.1 10*3/uL (ref 0.00–0.10)
BASOPHIL %: 1 % (ref 0–1)
EOSINOPHIL #: 0.2 10*3/uL (ref 0.00–0.50)
EOSINOPHIL %: 2 % (ref 1–7)
HCT: 39.4 % (ref 31.2–41.9)
HGB: 13.2 g/dL (ref 10.9–14.3)
LYMPHOCYTE #: 1.9 10*3/uL (ref 1.00–3.00)
LYMPHOCYTE %: 21 % (ref 16–44)
MCH: 29.1 pg (ref 24.7–32.8)
MCHC: 33.4 g/dL (ref 32.3–35.6)
MCV: 87.1 fL (ref 75.5–95.3)
MONOCYTE #: 1.3 10*3/uL — ABNORMAL HIGH (ref 0.30–1.00)
MONOCYTE %: 14 % — ABNORMAL HIGH (ref 5–13)
MPV: 9.2 fL (ref 7.9–10.8)
NEUTROPHIL #: 5.8 10*3/uL (ref 1.85–7.80)
NEUTROPHIL %: 63 % (ref 43–77)
PLATELETS: 277 10*3/uL (ref 140–440)
RBC: 4.53 10*6/uL (ref 3.63–4.92)
RDW: 13.8 % (ref 12.3–17.7)
WBC: 9.2 10*3/uL (ref 3.8–11.8)

## 2023-03-24 LAB — MAGNESIUM: MAGNESIUM: 2 mg/dL (ref 1.9–2.7)

## 2023-03-24 LAB — C-REACTIVE PROTEIN (CRP): C-REACTIVE PROTEIN (CRP): 17 mg/dL — ABNORMAL HIGH (ref 0.1–0.5)

## 2023-03-24 MED ORDER — MENTHOL 4 % TOPICAL GEL
1.0000 | Freq: Three times a day (TID) | CUTANEOUS | Status: DC | PRN
  Filled 2023-03-24: qty 5

## 2023-03-24 NOTE — Progress Notes (Signed)
[place] MEDICINE [facility]    HOSPITALIST PROGRESS NOTE    Caitlyn Gomez  Date of service: 03/24/2023  Date of Admission:  03/21/2023  Hospital Day:  LOS: 3 days     Subjective:   Patient is seen in follow-up for abdominal pain secondary to diverticulitis with abscess.  Patient is still with admits to having abdominal pain that is worse in her lower quadrants.  Patient's feels like she is having quite a bit of gas related pain today.  She denies any fever, chills, night sweats.  Also denies any diarrhea or vomiting.  She does admit to having some nausea.  Once again patient is agreeable to CT-guided drainage of her diverticular abscesses tomorrow by Dr. Dortha Schwalbe.    03/23/23  Patient seen and examined.  Admits to feeling better overall today.  Less abdominal pain.  Tolerating clear diet.  Discussed case with Dr. Salina April very small and not able to be safely drained.  Feels patient's issue is more related to the diverticular perforation than an abscess.  Recommends to continue current treatment.  Will reevaluate if symptoms worsen.      03/24/23  Patient seen and examined.  Abdominal pain continuing to improve.  Patient tolerating full liquid diet.  Asking about going home.  VSS and afebrile.  Agreeable to outpatient IV antibiotics if necessary.      A focused review of symptoms was performed and is negative unless specified in HPI/subjective.    Vital Signs:  Filed Vitals:    03/24/23 1300 03/24/23 1559 03/24/23 1710 03/24/23 1915   BP:  121/80  113/74   Pulse:  73  70   Resp:   18 18   Temp:  36.3 C (97.3 F)  36.9 C (98.4 F)   SpO2: 94% 96%  96%        Physical Exam:  GENERAL: No acute distress, Resting comfortably, Alert and oriented x3    SKIN: warm and dry.    CARDIAC: S1 and S2, no murmurs, clicks, or gallops. No S3.    LUNGS: Clear to auscultation bilaterally. No wheezes or rhonchi. No accessory muscle use noted.    GI: Abdomen soft, nontender. Good bowel sounds x 4. No organomegaly  appreciated.    GU: No suprapubic tenderness. No costovertebral tenderness.    EXTREMITIES: No pedal edema noted. Distal pulses equal bilaterally.    NEUROLOGICAL: Cranial nerves grossly intact. No gross focal deficit.     Current Medications:  acetaminophen (TYLENOL) tablet, 650 mg, Oral, Q6H PRN  albuterol 90 mcg per inhalation oral inhaler - "Respiratory to administer", 1 Puff, Inhalation, Q4H PRN  apixaban (ELIQUIS) tablet, 5 mg, Oral, 2x/day  atorvastatin (LIPITOR) tablet, 40 mg, Oral, QPM  HYDROcodone-acetaminophen (NORCO) 5-325 mg per tablet, 1 Tablet, Oral, Q6H PRN  ipratropium-albuterol 0.5 mg-3 mg(2.5 mg base)/3 mL Solution for Nebulization, 3 mL, Nebulization, 4x/day PRN  menthol 4% topical gel, 1 Tube, Topical, 3x/day PRN  meropenem (MERREM) 1 g in NS 100 mL IVPB minibag, 1 g, Intravenous, Q8H  metoprolol succinate (TOPROL-XL) 24 hr extended release tablet, 50 mg, Oral, Daily  morphine 2 mg/mL injection, 2 mg, Intravenous, Q3H PRN  nicotine (NICODERM CQ) transdermal patch (mg/24 hr), 21 mg, Transdermal, Daily  ondansetron (ZOFRAN) 2 mg/mL injection, 4 mg, Intravenous, Q8H PRN  pantoprazole (PROTONIX) delayed release tablet, 40 mg, Oral, 2x/day AC  simethicone (MYLICON) chewable tablet, 80 mg, Oral, Q6H PRN  umeclidinium-vilanterol (ANORO ELLIPTA) 62.5 mcg-25 mcg inhaler - "Respiratory to administer", 1  Puff, Inhalation, Daily        Labs:  CBC:     9.2 (09/04 0407) \   13.2 (09/04 0407) /   277 (09/04 0407)      / 39.4 (09/04 0407) \          BMP:   137 (09/04 0407) 101 (09/04 0407) 7 (09/04 0407)    /     91 (09/04 0407)   4.0 (09/04 0407) 27 (09/04 0407) 0.76 (09/04 0407) \              Recent Results (from the past 24 hour(s))   COMPREHENSIVE METABOLIC PANEL, NON-FASTING    Collection Time: 03/24/23  4:07 AM   Result Value    ALKALINE PHOSPHATASE 57    ALT (SGPT) 7    AST (SGOT) 11 (L)            Radiology:  No results found.     The laboratory results, imaging results and other diagnostic results  were reviewed in the EMR.    Assessment/ Plan:   Active Hospital Problems   (*Primary Problem)    Diagnosis    *Diverticulitis of colon with perforation     Diverticulitis of colon with microperforation and abscess  Chronic atrial fibrillation   chronic chronic obstructive pulmonary disease    Continue IV Merrem for treatment of diverticulitis.  CT guided abscess drainage cancelled 03/23/23 due to abscess being too small to drain.  Patient eager to go home.  Will discuss with Dr. Donnal Debar.  Will arrange outpatient Invanz if acceptable to surgery.  Repeat Abdominal/pelvis CT. Continue Eliquis for treatment of atrial fibrillation.   Continue her home dose of metoprolol for atrial fibrillation.  We will also continue her DuoNebs and Anoro Ellipta for chronic obstructive pulmonary disease.  Any further intervention be determined by the patient's hospital course.     MY ORDERS LAST 24 (24h ago, onward)       Start     Ordered    03/25/23 0530  CBC/DIFF  0530 - AM DRAW (labs only)         03/24/23 1957    03/25/23 0530  COMPREHENSIVE METABOLIC PANEL, NON-FASTING  0530 - AM DRAW (labs only)         03/24/23 1957    03/25/23 0530  C-REACTIVE PROTEIN (CRP)  0530 - AM DRAW (labs only)         03/24/23 1957    03/24/23 1955  menthol 4% topical gel  3 TIMES DAILY PRN         03/24/23 1955    03/24/23 1530  MISCELLANEOUS MD/DO TO NURSE  UNTIL DISCONTINUED        Comments: Patient may shower    03/24/23 1526    03/24/23 0530  CBC/DIFF  (Order Panel)  0530 - AM DRAW (labs only)         03/23/23 2141    03/24/23 0530  COMPREHENSIVE METABOLIC PANEL, NON-FASTING  (Order Panel)  0530 - AM DRAW (labs only)         03/23/23 2141    03/24/23 0530  MAGNESIUM  (Order Panel)  0530 - AM DRAW (labs only)         03/23/23 2141    03/24/23 0530  C-REACTIVE PROTEIN (CRP)  0530 - AM DRAW (labs only)         03/23/23 2141    03/24/23 0530  CBC WITH DIFF  PROCEDURE ONCE  03/24/23 0016    03/23/23 2300  apixaban (ELIQUIS) tablet  2 TIMES DAILY          03/23/23 2140    03/23/23 2145  WAS PATIENT ON APIXABAN PRIOR TO ADMISSION?  (LABS & NURSING ORDERS: [place])  UNTIL DISCONTINUED         03/23/23 2140    03/23/23 2145  APIXABAN NURSING ORDER  (LABS & NURSING ORDERS: [place])  UNTIL DISCONTINUED        Comments: Nursing Instructions:    1.  Print apixaban (Eliquis) guide using the link in this order.   2.  Nurse to provide apixaban patient guide to all patients and be sure that all new starts have received education by the trained anticoagulation educator.  If additional questions, contact pharmacy.         03/23/23 2140    Unscheduled  NOTIFY MD  (LABS & NURSING ORDERS: [place])  PRN       03/23/23 2140                    DVT/PE Prophylaxis: SCDs/ Venodynes/Impulse boots    Disposition Planning: Home discharge      On the day of the encounter, a total of  35 minutes was spent on this patient encounter including review of historical information, examination, documentation and post-visit activities. The time documented excludes procedural time.     Linus Orn, DO FACP [facility]  03/24/2023  [place] MEDICINE HOSPITALIST

## 2023-03-24 NOTE — Respiratory Therapy (Signed)
Patient states she has not smoked in 3 weeks.  I did leave a pamphlet with patient.

## 2023-03-25 ENCOUNTER — Inpatient Hospital Stay (HOSPITAL_COMMUNITY): Payer: Medicaid Other

## 2023-03-25 ENCOUNTER — Encounter (HOSPITAL_COMMUNITY): Payer: Self-pay | Admitting: Internal Medicine

## 2023-03-25 ENCOUNTER — Other Ambulatory Visit: Payer: Self-pay

## 2023-03-25 LAB — COMPREHENSIVE METABOLIC PANEL, NON-FASTING
ALBUMIN/GLOBULIN RATIO: 1 (ref 0.8–1.4)
ALBUMIN: 3.4 g/dL — ABNORMAL LOW (ref 3.5–5.7)
ALKALINE PHOSPHATASE: 55 U/L (ref 34–104)
ALT (SGPT): 8 U/L (ref 7–52)
ANION GAP: 10 mmol/L (ref 4–13)
AST (SGOT): 12 U/L — ABNORMAL LOW (ref 13–39)
BILIRUBIN TOTAL: 0.3 mg/dL (ref 0.3–1.0)
BUN/CREA RATIO: 10 (ref 6–22)
BUN: 7 mg/dL (ref 7–25)
CALCIUM, CORRECTED: 9.1 mg/dL (ref 8.9–10.8)
CALCIUM: 8.6 mg/dL (ref 8.6–10.3)
CHLORIDE: 102 mmol/L (ref 98–107)
CO2 TOTAL: 26 mmol/L (ref 21–31)
CREATININE: 0.69 mg/dL (ref 0.60–1.30)
ESTIMATED GFR: 110 mL/min/{1.73_m2} (ref >59)
GLOBULIN: 3.5 (ref 2.9–5.4)
GLUCOSE: 94 mg/dL (ref 74–109)
OSMOLALITY, CALCULATED: 273 mOsm/kg (ref 270–290)
POTASSIUM: 3.6 mmol/L (ref 3.5–5.1)
PROTEIN TOTAL: 6.9 g/dL (ref 6.4–8.9)
SODIUM: 138 mmol/L (ref 136–145)

## 2023-03-25 LAB — CBC WITH DIFF
BASOPHIL #: 0.1 10*3/uL (ref 0.00–0.10)
BASOPHIL %: 1 % (ref 0–1)
EOSINOPHIL #: 0.2 10*3/uL (ref 0.00–0.50)
EOSINOPHIL %: 2 % (ref 1–7)
HCT: 39.7 % (ref 31.2–41.9)
HGB: 13 g/dL (ref 10.9–14.3)
LYMPHOCYTE #: 2 10*3/uL (ref 1.00–3.00)
LYMPHOCYTE %: 21 % (ref 16–44)
MCH: 28.4 pg (ref 24.7–32.8)
MCHC: 32.9 g/dL (ref 32.3–35.6)
MCV: 86.6 fL (ref 75.5–95.3)
MONOCYTE #: 1.3 10*3/uL — ABNORMAL HIGH (ref 0.30–1.00)
MONOCYTE %: 14 % — ABNORMAL HIGH (ref 5–13)
MPV: 9.2 fL (ref 7.9–10.8)
NEUTROPHIL #: 5.9 10*3/uL (ref 1.85–7.80)
NEUTROPHIL %: 63 % (ref 43–77)
PLATELETS: 277 10*3/uL (ref 140–440)
RBC: 4.59 10*6/uL (ref 3.63–4.92)
RDW: 13.6 % (ref 12.3–17.7)
WBC: 9.4 10*3/uL (ref 3.8–11.8)

## 2023-03-25 LAB — C-REACTIVE PROTEIN (CRP): C-REACTIVE PROTEIN (CRP): 9.6 mg/dL — ABNORMAL HIGH (ref 0.1–0.5)

## 2023-03-25 MED ORDER — IOHEXOL 350 MG IODINE/ML INTRAVENOUS SOLUTION
75.0000 mL | INTRAVENOUS | Status: AC
Start: 2023-03-25 — End: 2023-03-25
  Administered 2023-03-25: 75 mL via INTRAVENOUS

## 2023-03-25 MED ORDER — HYDROCODONE 5 MG-ACETAMINOPHEN 325 MG TABLET
1.0000 | ORAL_TABLET | Freq: Four times a day (QID) | ORAL | 0 refills | Status: AC | PRN
Start: 2023-03-25 — End: 2023-03-28

## 2023-03-25 MED ORDER — DIATRIZOATE MEGLUMINE-DIATRIZOATE SODIUM 66 %-10 % ORAL SOLUTION
30.0000 mL | ORAL | Status: AC
Start: 2023-03-25 — End: 2023-03-25
  Administered 2023-03-25: 30 mL via ORAL

## 2023-03-25 NOTE — Nurses Notes (Signed)
Patient discharged home with instructions for the infusion center and appointments set. Patient informed to pick up medications at [facility]. Telemetry and IV removed. Patient's questions answered.

## 2023-03-25 NOTE — Nurses Notes (Signed)
Oral contrast bottle given to [facility]

## 2023-03-25 NOTE — Discharge Summary (Signed)
[place] MEDICINE [facility]     DISCHARGE SUMMARY      PATIENT NAME:  Caitlyn Gomez   MRN:  [id number]  DOB:  06-05-1976    INPATIENT ADMISSION DATE: 03/21/2023   DATE OF DISCHARGE:  03/25/23     ATTENDING PHYSICIAN: Linus Orn, DO     PRIMARY CARE PHYSICIAN: [facility] PRESENTATION:    Please see full admission H&P for details.      As per HPI:  Caitlyn Gomez is a 47 y.o. female who had concerns including Abdominal Pain.  Patient is a 47 year old female who presented to the emergency room with left lower quadrant abdominal pain for 1 week.  She described the pain as sharp and burning in nature.  She rates it as a 9/10.  There are no alleviating nor aggravating factors.  She also admitted to having some nausea and vomiting.  She was also states that she was occasionally had some bloody diarrhea. The patient as recently discharged from the hospital on March 17, 2023.  She was treated for diverticulitis during that stay.  Patient was discharged on levofloxacin 750 mg daily for 10 days and metronidazole 500 mg 3 times a day for 10 days.  She admits to taking these medications and denies missing any doses.  She also states she was seen at [facility] 2 days ago for same.  She states they wanted to admit her at that time; but she declined.  Heart rate is 88, respiratory rate is 21, oxygen saturation 98% on room air.  Patient was afebrile at 97.1.   At the timed of my examination she was still having left lower quadrant abdominal pain.  She admits to having several bouts of diverticulitis in the past.  She is agreeable to drainage of abscess by IR when available.  Eager to have clear liquids started.       FURTHER HOSPITAL COURSE WITH DISCHARGE DIAGNOSES:    Diverticulitis of colon with microperforation and abscess  Chronic atrial fibrillation   chronic chronic obstructive pulmonary disease     Patient was treated with IV  Merrem during her hospital  stay  for treatment of diverticulitis with microperforation and abscess.  CT guided abscess drainage cancelled 03/23/23 due to abscess being too small to drain and symptoms felt to be secondary to microperforation.  Patient eager to go home.  Dr. Donnal Debar agreeable to this.   Will transition to  outpatient Invanz for an additional 6 days (total of 10 days of IV antibiotics). IM Invanze arranged through infusion center.  Repeat Abdominal/pelvis CT did not mention abscess, but did show small microperforation again. Discussed with surgery and IR.  No further intervention needed at this time. Continue Eliquis for treatment of atrial fibrillation.   Continue her home dose of metoprolol for atrial fibrillation.    Patient to follow up with Dr. Donnal Debar for re-evaluation and colonoscopy/ surgery in the near future secondary to recurrent diverticulitis.  Patient was instructed to follow up with her PCP in 7-10 days.  Patient was discharged home in stable and improved condition.      For Complete details see daily progress notes and EMR.      Problem List:  Active Hospital Problems   (*Primary Problem)    Diagnosis    *Diverticulitis of colon with perforation               PHYSICAL EXAM   DAY OF  DISCHARGE:    BP 104/74   Pulse 69   Temp 36.3 C (97.3 F)   Resp 20   Ht 1.676 m (5\' 6" )   Wt (!) 142 kg (312 lb 11.2 oz)   LMP 09/07/2009   SpO2 98%   BMI 50.47 kg/m          General:  Patient is resting in bed, no acute distress, alert and oriented x3   Eyes:  PERRL, no scleral icterus   HENT:  Normocephalic, atraumatic, oral mucosa is moist and pink, no nasal discharge   Heart:  RRR, S1 and S2 auscultated, no murmurs appreciated   Lungs:  Unlabored respirations.  Lungs are clear to auscultation bilaterally, no wheezes, no rales  Abdomen:  Soft, active bowel sounds, non-tender to palpation, non-distended  Extremities:  Pulses equal in all extremities bilaterally.  Capillary refill less than 3 seconds.  No edema in lower  extremities bilaterally   Skin:  Warm and dry.  Not diaphoretic  Neuro:  A&O x 3.  No focal deficits.  Speech intact.  Not tremulous  Psych:  Cooperative, not agitated    LABS:  CBC with Diff (Last 48 Hours):    Recent Results in last 48 hours     03/24/23  0407 03/25/23  0354   WBC 9.2 9.4   HGB 13.2 13.0   HCT 39.4 39.7   MCV 87.1 86.6   PLTCNT 277 277   PMNS 63 63   LYMPHOCYTES 21 21   MONOCYTES 14* 14*   EOSINOPHIL 2 2   BASOPHILS 1  0.10 1  0.10          Last BMP  (Last result in the past 48 hours)        Na   K   Cl   CO2   BUN   Cr   Calcium   Glucose   Glucose-Fasting        03/25/23 0354 138   3.6   102   26   7   0.69   8.6   94               Last Hepatic Panel  (Last result in the past 48 hours)        Albumin   Total PTN   Total Bili   Direct Bili   Ast/SGOT   Alt/SGPT   Alk Phos        03/25/23 0354 3.4   6.9   0.3     12   8    55                BMP (Last 48 Hours):    Recent Results in last 48 hours     03/24/23  0407 03/25/23  0354   SODIUM 137 138   POTASSIUM 4.0 3.6   CHLORIDE 101 102   CO2 27 26   BUN 7 7   CREATININE 0.76 0.69   CALCIUM 8.7 8.6   GLUCOSENF 91 94          IMAGING:  No images are attached to the encounter.     DISCHARGE MEDICATIONS:     Current Discharge Medication List        START taking these medications.        Details   HYDROcodone-acetaminophen 5-325 mg Tablet  Commonly known as: NORCO   1 Tablet, Oral, EVERY 6 HOURS PRN  Qty: 12 Tablet  Refills: 0  CONTINUE these medications which have CHANGED during your visit.        Details   ipratropium-albuteroL 0.5 mg-3 mg(2.5 mg base)/3 mL nebulizer solution  Commonly known as: DUONEB  What changed:   when to take this  reasons to take this   3 mL, Nebulization, 4 TIMES DAILY  Qty: 28 Each  Refills: 0     omeprazole 40 mg Capsule, Delayed Release(E.C.)  Commonly known as: PRILOSEC  What changed: when to take this   40 mg, Oral, 2 TIMES DAILY  Qty: 90 Capsule  Refills: 4            CONTINUE these medications - NO CHANGES  were made during your visit.        Details   Anoro Ellipta 62.5-25 mcg/actuation oral diskus inhaler  Generic drug: umeclidinium-vilanteroL   1 Puff, Inhalation, DAILY  Refills: 0     apixaban 5 mg Tablet  Commonly known as: ELIQUIS   5 mg, Oral, 2 TIMES DAILY  Refills: 0     atorvastatin 40 mg Tablet  Commonly known as: LIPITOR   40 mg, Oral, EVERY EVENING  Refills: 0     metoprolol succinate 50 mg Tablet Sustained Release 24 hr  Commonly known as: TOPROL-XL   1 Tablet, Oral, DAILY  Refills: 0     nitroGLYCERIN 0.4 mg Tablet, Sublingual  Commonly known as: NITROSTAT   0.4 mg, Sublingual, EVERY 5 MIN PRN, for 3 doses over 15 minutes   Refills: 0     ondansetron 4 mg Tablet, Rapid Dissolve  Commonly known as: ZOFRAN ODT   4 mg, Oral, EVERY 8 HOURS PRN  Qty: 12 Tablet  Refills: 0     ProAir HFA 90 mcg/actuation oral inhaler  Generic drug: albuterol sulfate   1 Puff, Inhalation, EVERY 4 HOURS PRN  Qty: 6.7 g  Refills: 3            STOP taking these medications.      levoFLOXacin 750 mg Tablet  Commonly known as: LEVAQUIN     metroNIDAZOLE 500 mg Tablet  Commonly known as: FLAGYL                 DISCHARGE DISPOSITION:  [facility]    DISCHARGE INSTRUCTIONS:    Follow-up Information       Infusion, [facility]  .    Specialty: Hematology & Oncology  Contact information:  [address]  [place] [address]  [phone]                            SCHEDULE [place] INFUSION    IM Invanz 1 gm daily for 6 days beginning 03/26/2023     Type: INJECTION    Treatment duration (hrs): 0.5hr    Number of cycles: 6    Frequency of treatment: DAILY    Requires: CHAIR       Follow-up Information       Infusion, [facility]  .    Specialty: Hematology & Oncology  Contact information:  [address]  [place] [address]  [phone]                                  Copies sent to Care Team         Relationship Specialty Notifications Start End    Care,  Wandra Feinstein  Primary PCP - General EXTERNAL  10/04/21     Phone: [phone] Fax: [phone]         [address] [place] [address]    Lilian Coma, MD PCP - Managed Care/Insurance   06/24/09     Phone: [phone] Fax: [phone]         [address] [place] [address]            >30 minutes total were spent coordinating discharge day today      Linus Orn, DO  [facility] MEDICINE HOSPITALIST

## 2023-03-25 NOTE — Nurses Notes (Signed)
Patient competed oral contrast

## 2023-03-25 NOTE — Care Management Notes (Addendum)
Patient discharge ready.  Will require IM Invanze X 6 days.   On contact, patient is alert and oriented.   Prefers to come to [facility] to obtain her IM Invanz injections daily.   CM Director, Rica Koyanagi is assisting with placement of tx order/plan.   This CM contacted ER IV room and left message requesting scheduled time for IM injection.  Awaiting call back.     03:15PM  Received call back from IV Therapy re: scheduled appt for 02:00 pm each day X 6 days beginning 03/26/2023 for IM Invanz.

## 2023-03-26 ENCOUNTER — Other Ambulatory Visit: Payer: Self-pay

## 2023-03-26 ENCOUNTER — Telehealth (HOSPITAL_COMMUNITY): Payer: Self-pay

## 2023-03-26 ENCOUNTER — Encounter (HOSPITAL_COMMUNITY): Payer: Self-pay | Admitting: Internal Medicine

## 2023-03-26 ENCOUNTER — Ambulatory Visit
Admission: RE | Admit: 2023-03-26 | Discharge: 2023-03-26 | Disposition: A | Discharge status: Home / Self Care | Payer: Medicaid Other | Source: Ambulatory Visit | Attending: Internal Medicine | Admitting: Internal Medicine

## 2023-03-26 VITALS — BP 94/71 | HR 88 | Temp 97.3°F | Resp 18

## 2023-03-26 DIAGNOSIS — K5720 Diverticulitis of large intestine with perforation and abscess without bleeding: Secondary | ICD-10-CM | POA: Insufficient documentation

## 2023-03-26 MED ORDER — LIDOCAINE HCL 10 MG/ML (1 %) INJECTION SOLUTION
INTRAMUSCULAR | Status: AC
Start: 2023-03-26 — End: 2023-03-26
  Filled 2023-03-26: qty 20

## 2023-03-26 MED ORDER — LIDOCAINE HCL 10 MG/ML (1 %) INJECTION SOLUTION
1000.0000 mg | INTRAMUSCULAR | Status: DC
Start: 2023-03-26 — End: 2023-03-27
  Administered 2023-03-26: 1000 mg via INTRAMUSCULAR

## 2023-03-26 MED ORDER — ERTAPENEM 1 GRAM SOLUTION FOR INJECTION
INTRAMUSCULAR | Status: AC
Start: 2023-03-26 — End: 2023-03-26
  Filled 2023-03-26: qty 10

## 2023-03-26 NOTE — Progress Notes (Signed)
Transition of Care Contact Information  Discharge Date: 03/25/2023  Transition Facility Type--Hospital (Inpatient or Observation)  Facility Name--[facility]  Interactive Contact(s): Completed or attempted contact indicated by Date/Time  Completed Contact: 03/26/2023 10:53 AM  Contact Method(s)-- Patient/Caregiver Telephone  Clinical Staff Name/Role who Charmayne Sheer RN  Transition Assessment  Discharge Summary obtained?--Yes  How are you recovering?--Improving  Discharge Meds obtained?--Yes  Discharge medication changes reviewed?--Yes  Full Medication Reconciliation Completed?--Yes  Medication understanding --knows new medication(s)--knows purpose of medication  Medication Concerns?--No  Have everything needed for recovery?--Yes  Care Coordination:   Patient has transition follow-up appointment date and time?--Yes  Primary Care Transition Visit planned?--Yes  Specialist Transition Visit planned?--Yes  Patient/caregiver plans to attend transition visit?--Yes  Primary Follow-up Barrier  Interventions provided --reinforced discharge instructions--follow-up appointment date/time reinforced--patient expresses understanding of follow-up plan  Home Health or DME ordered at discharge?--No  Clinician/Team notified?--No  Primary reason clinician notified?  Transition Note:Plan:      Patient requests transportation referral for follow up appointment? No  Instructions:     Reviewed After Visit Summery (AVS) and discharge instructions as outlined on AVS. Yes  Agrees to contact PCP for non-acute symptoms during the hours of 8 a.m. - 4 p.m., PCP number provided/reinforced. Yes  Nurse Navigator toll free number, resources available, and 24/7 hours of operation provided to patient and/or caregiver. Yes    Referrals to population health? No    Additional information/assessment:      Leeanne Rio, RN    [image]

## 2023-03-26 NOTE — Nurses Notes (Signed)
Patient tolerated injection well. Verbalized understanding of med teaching.

## 2023-03-27 ENCOUNTER — Ambulatory Visit
Admission: RE | Admit: 2023-03-27 | Discharge: 2023-03-27 | Disposition: A | Discharge status: Home / Self Care | Payer: Medicaid Other | Source: Ambulatory Visit | Attending: Internal Medicine | Admitting: Internal Medicine

## 2023-03-27 VITALS — BP 132/86 | HR 88 | Temp 97.7°F | Resp 16

## 2023-03-27 DIAGNOSIS — K5720 Diverticulitis of large intestine with perforation and abscess without bleeding: Secondary | ICD-10-CM | POA: Insufficient documentation

## 2023-03-27 MED ORDER — LIDOCAINE HCL 10 MG/ML (1 %) INJECTION SOLUTION
INTRAMUSCULAR | Status: AC
Start: 2023-03-27 — End: 2023-03-27
  Filled 2023-03-27: qty 20

## 2023-03-27 MED ORDER — LIDOCAINE HCL 10 MG/ML (1 %) INJECTION SOLUTION
1000.0000 mg | INTRAMUSCULAR | Status: DC
Start: 2023-03-27 — End: 2023-03-28
  Administered 2023-03-27: 1000 mg via INTRAMUSCULAR

## 2023-03-27 MED ORDER — ERTAPENEM 1 GRAM SOLUTION FOR INJECTION
INTRAMUSCULAR | Status: AC
Start: 2023-03-27 — End: 2023-03-27
  Filled 2023-03-27: qty 10

## 2023-03-27 NOTE — Nurses Notes (Signed)
Tolerated injection well. Stated she is starting to feel a a little better. Ambulated out of facility

## 2023-03-28 ENCOUNTER — Ambulatory Visit
Admission: RE | Admit: 2023-03-28 | Discharge: 2023-03-28 | Disposition: A | Discharge status: Home / Self Care | Payer: Medicaid Other | Source: Ambulatory Visit | Attending: Internal Medicine | Admitting: Internal Medicine

## 2023-03-28 VITALS — BP 138/84 | HR 88 | Temp 97.6°F | Resp 17

## 2023-03-28 DIAGNOSIS — K5720 Diverticulitis of large intestine with perforation and abscess without bleeding: Secondary | ICD-10-CM | POA: Insufficient documentation

## 2023-03-28 MED ORDER — LIDOCAINE HCL 10 MG/ML (1 %) INJECTION SOLUTION
INTRAMUSCULAR | Status: AC
Start: 2023-03-28 — End: 2023-03-28
  Filled 2023-03-28: qty 20

## 2023-03-28 MED ORDER — ERTAPENEM 1 GRAM SOLUTION FOR INJECTION
INTRAMUSCULAR | Status: AC
Start: 2023-03-28 — End: 2023-03-28
  Filled 2023-03-28: qty 10

## 2023-03-28 MED ORDER — LIDOCAINE HCL 10 MG/ML (1 %) INJECTION SOLUTION
1000.0000 mg | INTRAMUSCULAR | Status: DC
Start: 2023-03-28 — End: 2023-03-29
  Administered 2023-03-28: 1000 mg via INTRAMUSCULAR

## 2023-03-28 NOTE — Nurses Notes (Signed)
Invanz given Im per order without adverse reaction. States she slept well last night and is feeling better. DC to home.

## 2023-03-29 ENCOUNTER — Other Ambulatory Visit: Payer: Self-pay

## 2023-03-29 ENCOUNTER — Ambulatory Visit
Admission: RE | Admit: 2023-03-29 | Discharge: 2023-03-29 | Disposition: A | Discharge status: Home / Self Care | Payer: Medicaid Other | Source: Ambulatory Visit | Attending: Internal Medicine | Admitting: Internal Medicine

## 2023-03-29 VITALS — BP 122/77 | HR 92 | Temp 98.0°F | Resp 17

## 2023-03-29 DIAGNOSIS — K5720 Diverticulitis of large intestine with perforation and abscess without bleeding: Secondary | ICD-10-CM | POA: Insufficient documentation

## 2023-03-29 MED ORDER — LIDOCAINE HCL 10 MG/ML (1 %) INJECTION SOLUTION
INTRAMUSCULAR | Status: AC
Start: 2023-03-29 — End: 2023-03-29
  Filled 2023-03-29: qty 20

## 2023-03-29 MED ORDER — LIDOCAINE HCL 10 MG/ML (1 %) INJECTION SOLUTION
1000.0000 mg | INTRAMUSCULAR | Status: DC
Start: 2023-03-29 — End: 2023-03-30
  Administered 2023-03-29: 1000 mg via INTRAMUSCULAR

## 2023-03-29 MED ORDER — ERTAPENEM 1 GRAM SOLUTION FOR INJECTION
INTRAMUSCULAR | Status: AC
Start: 2023-03-29 — End: 2023-03-29
  Filled 2023-03-29: qty 10

## 2023-03-29 NOTE — Nurses Notes (Signed)
Invanz given IM per order without adverse reaction. Pt DC home.

## 2023-03-30 ENCOUNTER — Ambulatory Visit
Admission: RE | Admit: 2023-03-30 | Discharge: 2023-03-30 | Disposition: A | Discharge status: Home / Self Care | Payer: Medicaid Other | Source: Ambulatory Visit | Attending: Internal Medicine | Admitting: Internal Medicine

## 2023-03-30 VITALS — BP 115/82 | HR 88 | Temp 98.9°F | Resp 17

## 2023-03-30 DIAGNOSIS — K5720 Diverticulitis of large intestine with perforation and abscess without bleeding: Secondary | ICD-10-CM | POA: Insufficient documentation

## 2023-03-30 MED ORDER — LIDOCAINE HCL 10 MG/ML (1 %) INJECTION SOLUTION
1000.0000 mg | INTRAMUSCULAR | Status: DC
Start: 2023-03-30 — End: 2023-03-31
  Administered 2023-03-30: 1000 mg via INTRAMUSCULAR

## 2023-03-30 MED ORDER — LIDOCAINE HCL 10 MG/ML (1 %) INJECTION SOLUTION
INTRAMUSCULAR | Status: AC
Start: 2023-03-30 — End: 2023-03-30
  Filled 2023-03-30: qty 20

## 2023-03-30 MED ORDER — ERTAPENEM 1 GRAM SOLUTION FOR INJECTION
INTRAMUSCULAR | Status: AC
Start: 2023-03-30 — End: 2023-03-30
  Filled 2023-03-30: qty 10

## 2023-03-30 NOTE — Nurses Notes (Signed)
Tolerated injection well. 

## 2023-03-31 ENCOUNTER — Ambulatory Visit
Admission: RE | Admit: 2023-03-31 | Discharge: 2023-03-31 | Disposition: A | Discharge status: Home / Self Care | Payer: Medicaid Other | Source: Ambulatory Visit | Attending: Internal Medicine | Admitting: Internal Medicine

## 2023-03-31 ENCOUNTER — Other Ambulatory Visit: Payer: Self-pay

## 2023-03-31 VITALS — BP 124/89 | HR 95 | Temp 98.0°F | Resp 18

## 2023-03-31 DIAGNOSIS — K5720 Diverticulitis of large intestine with perforation and abscess without bleeding: Secondary | ICD-10-CM | POA: Insufficient documentation

## 2023-03-31 MED ORDER — ERTAPENEM 1 GRAM SOLUTION FOR INJECTION
INTRAMUSCULAR | Status: AC
Start: 2023-03-31 — End: 2023-03-31
  Filled 2023-03-31: qty 10

## 2023-03-31 MED ORDER — LIDOCAINE HCL 10 MG/ML (1 %) INJECTION SOLUTION
1000.0000 mg | INTRAMUSCULAR | Status: DC
Start: 2023-03-31 — End: 2023-04-01
  Administered 2023-03-31: 1000 mg via INTRAMUSCULAR

## 2023-03-31 MED ORDER — LIDOCAINE HCL 10 MG/ML (1 %) INJECTION SOLUTION
INTRAMUSCULAR | Status: AC
Start: 2023-03-31 — End: 2023-03-31
  Filled 2023-03-31: qty 20

## 2023-03-31 NOTE — Nurses Notes (Signed)
Pt given Invanz IM per order without adverse reactions or complaints. DC home.

## 2023-04-06 ENCOUNTER — Encounter (INDEPENDENT_AMBULATORY_CARE_PROVIDER_SITE_OTHER): Payer: Self-pay | Admitting: Surgery

## 2023-04-06 ENCOUNTER — Other Ambulatory Visit: Payer: Self-pay

## 2023-04-06 ENCOUNTER — Ambulatory Visit (INDEPENDENT_AMBULATORY_CARE_PROVIDER_SITE_OTHER): Payer: Medicaid Other | Admitting: Surgery

## 2023-04-06 VITALS — BP 151/110 | HR 81 | Temp 96.4°F | Ht 66.0 in | Wt 299.0 lb

## 2023-04-06 DIAGNOSIS — Z09 Encounter for follow-up examination after completed treatment for conditions other than malignant neoplasm: Secondary | ICD-10-CM

## 2023-04-06 DIAGNOSIS — K5790 Diverticulosis of intestine, part unspecified, without perforation or abscess without bleeding: Secondary | ICD-10-CM

## 2023-04-06 DIAGNOSIS — K5732 Diverticulitis of large intestine without perforation or abscess without bleeding: Secondary | ICD-10-CM

## 2023-04-06 DIAGNOSIS — Q0700 Arnold-Chiari syndrome without spina bifida or hydrocephalus: Secondary | ICD-10-CM

## 2023-04-06 DIAGNOSIS — Z8719 Personal history of other diseases of the digestive system: Secondary | ICD-10-CM

## 2023-04-06 MED ORDER — SODIUM SUL 1.479 GRAM-POTAS CH 0.188 GRAM-MAGNES SUL 0.225 GRAM TABLET
ORAL_TABLET | ORAL | 0 refills | Status: DC
Start: 2023-04-06 — End: 2023-08-11

## 2023-04-06 NOTE — Progress Notes (Signed)
GENERAL SURGERY, [facility]  [address]  [place] [address]    History and Physical    Name: Caitlyn Gomez MRN:  [id number]   Date: 04/06/2023 DOB:  10-02-1975 (47 y.o.)              Reason for Visit: Diverticulitis [facility] follow up )    History of Present Illness  Caitlyn Gomez presents today for follow up visit after being hospitalized for diverticulitis with a small less than 5 cm abscess which did not require CT-guided drainage.  The patient presents today with mild occasional discomfort in the left lower quadrant but no nausea or vomiting.  No worsening of her symptoms.  The patient denies any fever or chills.  She denies any blood in her stool.    The patient has Chiari malformation and it was scheduled for MRI of the head on October 5.    The patient has lost weight from 368 lb to 299 lb over the past 4-6 months.  The patient takes Lipomeno and Lipostat.          Review of the result(s) of each unique test:  Patient underwent diagnostic testing ( none) prior to this dates visit.  I have personally reviewed the results and that serves as a component of the medical decision making for this encounter       Review of prior external note(s) from each unique source:  Patients referral to this office including a recent assessment by the referring provider.  This was reviewed by me for this unique office visit for the indication and intent of the referral as well as any pertinent medical or surgical history relevant to the patients independent evaluation by me today.      Patient History  Past Medical History:   Diagnosis Date    A-fib (CMS HCC)     Dr. Kathie Rhodes. Gomez    Abnormal Pap smear     Anxiety     Arthropathy     Asthma     Blastoma (CMS HCC)     ears    Brain tumor (benign) (CMS HCC)     Cervical myelopathy (CMS HCC)     Chiari I malformation (CMS HCC) 2021    Chronic obstructive airway disease (CMS HCC)     Complex regional pain syndrome I     Coronary artery disease      Depression     Depression 10/05/2021    Dysplasia of cervix     Dysrhythmias     a fib    Ectopic pregnancy     Fibromyalgia     GERD (gastroesophageal reflux disease)     controlled with med    History of anesthesia complications     aspirated for scope last time    History of cervical cancer     HTN (hypertension)     HTN (hypertension) 10/05/2021    Hyperlipidemia     NAFLD (nonalcoholic fatty liver disease)     Neck mass     PCOS (polycystic ovarian syndrome)     Radiculopathy     Smoking addiction     Stroke (CMS [facility])     Caitlyn Gomez - (left facial droop and left arm weakness)    Thyroid disorder     nodule    Thyroid nodule     Varicosities          Past Surgical History:   Procedure Laterality Date  BLADDER SURGERY  2023    mesh placed    CYSTOSCOPY  11/04/2022    cystoscopy, retropubic mid urethral sling    HX APPENDECTOMY      HX DILATION AND CURETTAGE      HX ENDOMETRIAL ABLATION      HX HEART CATHETERIZATION      stent    HX HYSTERECTOMY      HX TONSILLECTOMY      HX TUBAL LIGATION           Current Outpatient Medications   Medication Sig    ANORO ELLIPTA 62.5-25 mcg/actuation Inhalation oral diskus inhaler Take 1 Puff by inhalation Once a day    apixaban (ELIQUIS) 5 mg Oral Tablet Take 1 Tablet (5 mg total) by mouth Twice daily    atorvastatin (LIPITOR) 40 mg Oral Tablet Take 1 Tablet (40 mg total) by mouth Every evening    ipratropium-albuterol 0.5 mg-3 mg(2.5 mg base)/3 mL Solution for Nebulization Take 3 mL by nebulization Four times a day for 7 days (Patient taking differently: Take 3 mL by nebulization Four times a day as needed)    metoprolol succinate (TOPROL-XL) 50 mg Oral Tablet Sustained Release 24 hr Take 1 Tablet (50 mg total) by mouth Once a day    nitroGLYCERIN (NITROSTAT) 0.4 mg Sublingual Tablet, Sublingual Place 1 Tablet (0.4 mg total) under the tongue Every 5 minutes as needed for Chest pain for 3 doses over 15 minutes    omeprazole (PRILOSEC) 40 mg Oral Capsule, Delayed  Release(E.C.) Take 1 Capsule (40 mg total) by mouth Twice daily    ondansetron (ZOFRAN ODT) 4 mg Oral Tablet, Rapid Dissolve Take 1 Tablet (4 mg total) by mouth Every 8 hours as needed for Nausea/Vomiting    PROAIR HFA 90 mcg/actuation Inhalation oral inhaler Take 1 Puff by inhalation Every 4 hours as needed    sod sulf-pot chloride-mag sulf 1.479-0.188- 0.225 gram Oral Tablet Take 12 pills at 10-10:30 am. Take 12 pills at 6-6:30pm. Drink plenty of fluids all day/evening.     Allergies   Allergen Reactions    Bee Venom Protein (Honey Bee) Anaphylaxis    Hymenoptera Allergenic Extract      Other reaction(s): UNKNOWN    Penicillins Hives/ Urticaria and Rash     Other reaction(s): Rash, Skin Irritation  Rash and swellling      Sulfa (Sulfonamides) Rash and Hives/ Urticaria     Other Reaction(s): Rash-Raised    Rash and swelling    Amoxicillin Rash     Family Medical History:       Problem Relation (Age of Onset)    Colon Cancer Maternal Grandfather    Colon Polyps Maternal Grandmother    Diabetes Maternal Grandmother, Paternal Aunt    Healthy Mother    Heart Disease Father    Hypertension (High Blood Pressure) Father    Inflammatory Bowel Dz Maternal cousin    Stomach Cancer Maternal Aunt    Sudden Death no cause Other            Social History     Tobacco Use    Smoking status: Every Day     Current packs/day: 1.00     Average packs/day: 1 pack/day for 27.7 years (27.7 ttl pk-yrs)     Types: Cigarettes     Start date: 1997    Smokeless tobacco: Never   Vaping Use    Vaping status: Never Used   Substance Use Topics    Alcohol use: Not  Currently     Alcohol/week: 0.8 standard drinks of alcohol     Types: 1 Glasses of wine per week     Comment: 2-3 times a month;    Drug use: Never            Physical Examination:  Vitals:    04/06/23 1024   BP: (!) 151/110   Pulse: 81   Temp: (!) 35.8 C (96.4 F)   SpO2: 92%   Weight: 136 kg (299 lb)   Height: 1.676 m (5\' 6" )   BMI: 48.36        General: appropriate for age. in no  acute distress.    Vital signs are present above and have been reviewed by me     HEENT: Atraumatic, Normocephalic. PERRLA. EOMI. Nose clear. Throat clear    Lungs: Nonlabored breathing with symmetric expansion. Clear to auscultation bilaterally    Heart:Regular wth respect to rate and rythmn.    Abdomen:Soft.  Slightly tender to deep palpation left lower quadrant but no rebound guarding or peritoneal signs. Nondistended and otherwise benign    Extremities: Grossly normal. No major deformities     Neuro:  Grossly normal motor and sensory function    Psychiatric: Alert and oriented to person, place, and time. affect appropriate      Assessment and Plan  The patient will be scheduled for CT scan of the abdomen pelvis has a follow up to see if her diverticulitis has resolved.    The patient will be scheduled for colonoscopy    The patient will continue to follow with her family physician and to continue in the weight loss medications that is it was on.  I will look into those medications to see if there is any contraindication to take that at this point given her diverticulitis.    The patient understood the plan of therapy and agreed to that plan.      Follow Up:  No follow-ups on file.      ICD-10-CM    1. Diverticulosis  K57.90 CT ABDOMEN PELVIS W IV CONTRAST      2. History of diverticular abscess  Z87.19           Luvada Salamone B Dyanne Yorks, MD ,MBA,FACS    I appreciate the opportunity to be involved in the care of your patients.  If you have any questions or concerns regarding this encounter, please do not hesitate to contact me at your convenience.      This note may have been partially generated using MModal Fluency Direct system, and there may be some incorrect words, spellings, and punctuation that were not noted in checking the note before saving, though effort was made to avoid such errors.

## 2023-04-14 ENCOUNTER — Other Ambulatory Visit: Payer: Self-pay

## 2023-04-14 ENCOUNTER — Ambulatory Visit: Payer: Medicaid Other | Attending: NURSE PRACTITIONER

## 2023-04-14 DIAGNOSIS — K5792 Diverticulitis of intestine, part unspecified, without perforation or abscess without bleeding: Secondary | ICD-10-CM | POA: Insufficient documentation

## 2023-04-14 LAB — COMPREHENSIVE METABOLIC PANEL, NON-FASTING
ALBUMIN/GLOBULIN RATIO: 1.2 (ref 0.8–1.4)
ALBUMIN: 3.8 g/dL (ref 3.5–5.7)
ALKALINE PHOSPHATASE: 73 U/L (ref 34–104)
ALT (SGPT): 19 U/L (ref 7–52)
ANION GAP: 5 mmol/L (ref 4–13)
AST (SGOT): 16 U/L (ref 13–39)
BILIRUBIN TOTAL: 0.5 mg/dL (ref 0.3–1.0)
BUN/CREA RATIO: 9 (ref 6–22)
BUN: 7 mg/dL (ref 7–25)
CALCIUM, CORRECTED: 8.8 mg/dL — ABNORMAL LOW (ref 8.9–10.8)
CALCIUM: 8.6 mg/dL (ref 8.6–10.3)
CHLORIDE: 106 mmol/L (ref 98–107)
CO2 TOTAL: 29 mmol/L (ref 21–31)
CREATININE: 0.75 mg/dL (ref 0.60–1.30)
ESTIMATED GFR: 101 mL/min/{1.73_m2} (ref >59)
GLOBULIN: 3.1 (ref 2.9–5.4)
GLUCOSE: 86 mg/dL (ref 74–109)
OSMOLALITY, CALCULATED: 277 mOsm/kg (ref 270–290)
POTASSIUM: 4 mmol/L (ref 3.5–5.1)
PROTEIN TOTAL: 6.9 g/dL (ref 6.4–8.9)
SODIUM: 140 mmol/L (ref 136–145)

## 2023-04-14 LAB — CBC WITH DIFF
BASOPHIL #: 0.1 10*3/uL (ref 0.00–0.10)
BASOPHIL %: 1 % (ref 0–1)
EOSINOPHIL #: 0.2 10*3/uL (ref 0.00–0.50)
EOSINOPHIL %: 2 % (ref 1–7)
HCT: 40.5 % (ref 31.2–41.9)
HGB: 13.4 g/dL (ref 10.9–14.3)
LYMPHOCYTE #: 1.6 10*3/uL (ref 1.00–3.00)
LYMPHOCYTE %: 24 % (ref 16–44)
MCH: 28.7 pg (ref 24.7–32.8)
MCHC: 33 g/dL (ref 32.3–35.6)
MCV: 87 fL (ref 75.5–95.3)
MONOCYTE #: 0.7 10*3/uL (ref 0.30–1.00)
MONOCYTE %: 10 % (ref 5–13)
MPV: 9.1 fL (ref 7.9–10.8)
NEUTROPHIL #: 4.3 10*3/uL (ref 1.85–7.80)
NEUTROPHIL %: 63 % (ref 43–77)
PLATELETS: 232 10*3/uL (ref 140–440)
RBC: 4.66 10*6/uL (ref 3.63–4.92)
RDW: 14.4 % (ref 12.3–17.7)
WBC: 6.8 10*3/uL (ref 3.8–11.8)

## 2023-04-14 LAB — C-REACTIVE PROTEIN (CRP): C-REACTIVE PROTEIN (CRP): 2.4 mg/dL — ABNORMAL HIGH (ref 0.1–0.5)

## 2023-04-21 ENCOUNTER — Other Ambulatory Visit (HOSPITAL_COMMUNITY): Payer: Self-pay | Admitting: NURSE PRACTITIONER

## 2023-04-21 ENCOUNTER — Other Ambulatory Visit: Payer: Self-pay

## 2023-04-21 ENCOUNTER — Ambulatory Visit
Admission: RE | Admit: 2023-04-21 | Discharge: 2023-04-21 | Disposition: A | Discharge status: Home / Self Care | Payer: Medicaid Other | Source: Ambulatory Visit | Attending: NURSE PRACTITIONER | Admitting: NURSE PRACTITIONER

## 2023-04-21 DIAGNOSIS — R9431 Abnormal electrocardiogram [ECG] [EKG]: Secondary | ICD-10-CM

## 2023-04-21 DIAGNOSIS — I4510 Unspecified right bundle-branch block: Secondary | ICD-10-CM

## 2023-04-21 DIAGNOSIS — R42 Dizziness and giddiness: Secondary | ICD-10-CM | POA: Insufficient documentation

## 2023-04-21 LAB — ECG 12 LEAD
Atrial Rate: 68 {beats}/min
Calculated P Axis: 45 degrees
Calculated R Axis: 19 degrees
Calculated T Axis: 16 degrees
PR Interval: 156 ms
QRS Duration: 106 ms
QT Interval: 430 ms
QTC Calculation: 457 ms
Ventricular rate: 68 {beats}/min

## 2023-04-22 ENCOUNTER — Other Ambulatory Visit (HOSPITAL_COMMUNITY): Payer: Self-pay | Admitting: NURSE PRACTITIONER

## 2023-04-22 DIAGNOSIS — R42 Dizziness and giddiness: Secondary | ICD-10-CM

## 2023-04-22 NOTE — Progress Notes (Signed)
 Chronic Pain Management          Requesting provider:  Ernestene Kiel, DO  [address]  [place],  [place] [address]    DOS: 04/26/2023    MRN #: [id number]    CHIEF COMPLAINT:  Pain:   Chief Complaint   Patient presents with   . New Patient     Ref by Ernestene Kiel, DO   . Neck Pain         HISTORY OF PRESENT ILLNESS:    Referring Physician:    Ernestene Kiel, DO  [address]  [place],  [place] [address]    Ms. Caitlyn Gomez is a 47 y.o. female referred to the [facility] for Consultation for evaluation and treatment of her neck and arm pain. She has mainly left shoulder pain as worst complaint over her anterior shoulder region. She had rotator cuff surgery 06/2021 without improvement of her shoulder pain. She also has pain in her neck centrally radiating down her left arm, into her thumb and index fingers. She is not able to turn her neck without severe pain She has intermittent numbness in her left arm.   She  has history of headaches, vision changes prompting workup leading to chiari malformation diagnosis and is followed at [facility] by Dr. Melvyn Neth. She feels her neck pain and shoulder pain all began after the fall at work. She began to have other symptoms such as tingling and numbness in her body, fatigue, worsening headaches after the fall as well. The pain interferes with quality of life and activities of daily living.     she has PMHx of chiari malformation (stable monitored by NSGY at [facility]), HTN.     Pain started 03/08/2021 falling at working landing on left side of her body.      She describes the pain level to be running at 7/10 on VAS most of the days     Exacerbating factors:using arms, turning neck  Alleviating factors: medications     Medications:  - Opioids:  - NSAIDs/Acetaminophen:   - antidepressants:  - Anticonvulsant/Membrane stabilizers: lyrica in past was partially helpful  - Muscle Relaxants:  - Other: eliquis     Physical Therapy:  6 weeks completed 08/2022 for left  shoulder    Pain Interventional Procedure:   Prior left shoulder injection no relief      Surgeries:  06/2021 left rotator cuff repair    MRI C spine 04/2023:  FINDINGS:   Vertebrae:  Cervical vertebral body heights are preserved.   Bone marrow signal is unremarkable.     Alignment:  Normal.  No spondylolisthesis.     Spinal Cord:  Cervical spinal cord is of normal size and signal intensities.  Structures at the foramen magnum are unremarkable.     C2-3:  Negative for stenosis     C3-4:  Negative for stenosis     C4-5:  Mild left recess encroachment due to facet hypertrophy     C5-6:  Negative for stenosis     C6-7:  Negative for stenosis     C7-T1: Negative for stenosis     There is a Chiari I malformation as described on the prior scan     EMG/NCS 08/2022:  mpression:     Today's study was compared to a similar study performed 12/25/21.  In a nutshell, her cervical denervation in the left arm is overall better but worse in the right arm (opposite of symptoms).  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  See below    Electrodiagnostic Evidence of Moderate RIGHT C7 and C8 Cervical radiculopathies.  The right C7 is worse than before (8 months ago).  On the left there is a Moderate acute C7 and a Mild acute C5.  The left arm is better than before (wrt this exam).  She had mildly rapid recruitment noted in her left deltoid (C5>C6) which I did not see before which would support but not necessarily prove unto itself that she has a cervical myelopathy.  I had opined before that she did have a cervical myelopathy due to many factors not the least of which was new onset bladder incontinence after a fall.  I was able to recreate her facial symptoms with compression on the neck which implies that she has high cervical compression in her spinal canal.  Again I do not have any imaging to compare or correlate.  There were no other cervical  radiculopathies noted bilaterally today  I am concerned that she might be developing CRPS in the left arm.  She seems to have more hyperpathia and allodynia than she did 8 months ago.  This condition is very difficult to prove as it is a clinical diagnosis  No electrodiagnostic evidence of any carpal tunnel syndrome bilaterally today  No electrodiagnostic evidence of any ulnar neuropathy bilaterally at the elbows or otherwise  No electrodiagnostic evidence of any brachial plexopathy bilaterally  No electrodiagnostic evidence of any other nerve injury or disease bilaterally in the upper extremities today         SOAPP from initial visit  1. How often do you have mood swings? 1  2. How often do you smoke a cigarette within an hour after you awake? 4  3. How often have you taken medication other than the way it was prescribed? 0  4. How often have you used illegal drugs (example, marijuana, cocaine, etc.) in the past five years? 3  5. How often, in your lifetime, have you had legal problems or been arrested? 0     soapp = 8     Patient is employed as a truck stop . Patient lives with their family.  Illicit drug use: No.  Psychiatric comorbid condition: No  Activity Level: low         Patient denies any recent stroke/TIAs, weakness, bowel / bladder incontinence or any episode of loss of loss of consciousness.      Current Medications:  .  pregabalin (LYRICA) 50 mg Capsule  .  ondansetron (ZOFRAN ODT) 4 mg Tablet, Rapid Dissolve  .  Fluticasone-Salmeterol (ADVAIR HFA) 230-21 mcg/actuation HFA Aerosol Inhaler  .  Inhalational Spacing Device Spacer  .  metoprolol succinate (TOPROL  XL) 25 mg Tablet Sustained Release 24HR  .  omeprazole (PRILOSEC) 20 mg Capsule, Delayed Release(E.C.)  .  nitroglycerin (NITROSTAT) 0.4 mg Tablet, Sublingual  .  albuterol (PROVENTIL HFA) 90 mcg/actuation HFA Aerosol Inhaler  .  atorvastatin (LIPITOR) 10 mg Tablet  .  apixaban (ELIQUIS) 5 mg Tablet tablet          ALLERGY:  Allergies   Allergen  Reactions   . Amoxicillin Rash     Rash and swellling   . Sulfa (Sulfonamide Antibiotics) Rash     Rash and swelling         PAST MEDICAL HISTORY:  Past Medical History:  Diagnosis Date   . Asthma    . Cervical cancer (HCC)    . COPD (chronic obstructive pulmonary disease) (HCC)    . Ear tumors     RIGHT   . Eczema    . GERD (gastroesophageal reflux disease)    . High cholesterol    . Hypertension    . Myocardial infarction (HCC)    . Polycystic ovaries    . Shortness of breath on exertion           PAST SURGICAL HISTORY:  Past Surgical History:   Procedure Laterality Date   . HX APPENDECTOMY     . HX COLONOSCOPY     . HX EGD     . HX HEART CATHETERIZATION     . HX HYSTERECTOMY     . HX TONSILLECTOMY     . SHLDR ARTHROSCOP,PART ACROMIOPLAS Left 06/19/2021    Procedure: SHOULDER, ARTHROSCOPY DECOMPRESSION OF SUBACROMIAL SPACE WITH PARTIAL ACROMIOPLASTY, WITH CORACOACROMIAL LIGAMENT;  Surgeon: Ernestene Kiel, DO;  Location: [facility] OR         FAMILY HISTORY:  No family history on file.    SOCIAL HISTORY:  Social History     Socioeconomic History   . Marital status: Divorced     Spouse name: Not on file   . Number of children: Not on file   . Years of education: Not on file   . Highest education level: Not on file   Occupational History   . Not on file   Tobacco Use   . Smoking status: Every Day     Packs/day: 1.00     Years: 20.00     Additional pack years: 0.00     Total pack years: 20.00     Types: Cigarettes   . Smokeless tobacco: Never   . Tobacco comments:     04-26-23   Vaping Use   . Vaping Use: Never used   Substance and Sexual Activity   . Alcohol use: Yes     Comment: socially 04-26-23   . Drug use: Not Currently     Comment: 04-26-23   . Sexual activity: Not on file   Other Topics Concern   . Not on file   Social History Narrative   . Not on file     Social Determinants of Health     Financial Resource Strain: Not on file   Food Insecurity: Not on file   Transportation Needs: Not on file   Physical Activity:  Not on file   Stress: Not on file   Social Connections: Not on file   Intimate Partner Violence: Not on file   Housing Stability: Not on file           Review of Systems:     System Negative Positive Comments         Constitutional  x     Eyes  x     ENT  x     Cardiovascular x     Pulmonary x     Gastrointestinal x     Genitourinary x     Muscoloskeletal  x Muscle pain, joint pain, restricted movement    Skin x     Endocrine x     Neurological  x     Psychiatric x     Hematologic/Lymph x     Allergic/Immunologic x           EXAMINATION:  Vital Signs:  BP (!) 146/77  Pulse 68   Resp 18   Ht 1.676 m (5\' 6" )   Wt (!) 139.2 kg (306 lb 12.8 oz)   BMI 49.52 kg/m   Body mass index is 49.52 kg/m.    GENERAL:  Patient is an otherwise pleasant female.  Patient is awake, alert, cooperative, no apparent distress; Appropriate in conversation.   HEENT: Normocephalic, atraumatic. vision intact, hearing intact, supple   CVS: No Cyanosis, Negative bilateral LE edema   Respiratory:  Normal breathing, with no signs of distress  GI: -ve Ascites, -ve Jaundice   ABDOMEN:  Not examined.     SKIN:  WNL. Warm and dry. No lesions, apparent petechiae, bruises or rashes.  No signs of infection the in cervical paraspinous region.   NEURO: CN II-XII are grossly intact.    Sensation is intact and symmetrical to light touch throughout bilateral upper extremities.     DTR's  +2 bilateral biceps, +2 at brachioradialis and ulnar;    neg Spurlings test.    EXTREMITIES: No clubbing, cyanosis, or edema.     MUSCULOSKELETAL:    Gait: Ambulates without an assistive device. Gait is slightly unsteady  Positive for trigger points in the bilateral cervical paraspinous and trapezii.   Motor strength is 5/5 throughout all motor groups in bilateral upper extremities.     Cervical Spine: very limited in extension and lateral rotation.  positive facet loading.  paraspinal  tenderness bilaterally        ASSESSMENT:     Ms. Caitlyn Gomez is a 47 y.o.  female referred to the [facility] for evaluation and treatment of her neck and arm pain    1. Cervical radiculopathy    2. DDD (degenerative disc disease), cervical    3. Myofascial pain    4. Injury of left rotator cuff, sequela    She has chronic neck and arm, left shoulder pain s/p fall at work. I am unclear why she has other symptoms such as vision changes, headache, numbness in her arms and legs, fatigue. Her C spine MRI does not explain the degree of her neck and arm pain either. Will refer her to PT for her neck at this time. She has f/u with NSGY at [facility] upcoming to review her imaging and hopefully shed some light on her symptoms. I do not feel she matches symptoms of CRPS, but regardless treatment would be still similar focusing on symptom management and PT. Will retry her back on lyrica as it was previously helpful for her pain without side effects, risks and side effects discussed.        PLAN:  - retry lyrica 50mg  TID  - PT for neck and arm  - PMP Reviewed, UDS reviewed if available  - All questions answered and patient voiced understanding  - Opioid management: SOAPP risk assessment is Positive. Initial risk stratification is moderate risk as well. I don't recommend narcotics as a long term treatment option, but may be indicated if patients have failed non opioid treatments and risks of opioid treatment outweigh risks.  I will not be able to prescribe any opioid medication, per our clinic policy, in the first visit.  - Warned the patient about possible red flags including developing focal weakness/paralysis, bowel or bladder incontinence/extension and advised him to report to the closest ER or to call 911 if any of these symptoms developed.  - Discussed with patient about possible side effects of medications prescribed through our clinic, and how she  will need to contact our clinic for any concern regarding her medications.    - The treatment will be a comprehensive,  multimodal approach. The goal for patient to have improved physical function and quality of life. Each visit the plan of care will be tailored to focus on that goal.       - Patient advised to contact [facility] for any concern or exacerbation of sypmtoms.  - RTC in 3 months  - No opioid scripts given at this visit      Rica Koyanagi, MD  Pain Management  04/26/2023

## 2023-04-24 ENCOUNTER — Ambulatory Visit
Admission: RE | Admit: 2023-04-24 | Discharge: 2023-04-24 | Disposition: A | Discharge status: Home / Self Care | Payer: Medicaid Other | Source: Ambulatory Visit

## 2023-04-24 ENCOUNTER — Other Ambulatory Visit: Payer: Self-pay

## 2023-04-24 ENCOUNTER — Ambulatory Visit (HOSPITAL_COMMUNITY)
Admission: RE | Admit: 2023-04-24 | Discharge: 2023-04-24 | Disposition: A | Discharge status: Home / Self Care | Payer: Medicaid Other | Source: Ambulatory Visit

## 2023-04-24 DIAGNOSIS — M79602 Pain in left arm: Secondary | ICD-10-CM

## 2023-04-24 DIAGNOSIS — M25512 Pain in left shoulder: Secondary | ICD-10-CM

## 2023-04-24 DIAGNOSIS — M542 Cervicalgia: Secondary | ICD-10-CM | POA: Insufficient documentation

## 2023-04-24 DIAGNOSIS — H903 Sensorineural hearing loss, bilateral: Secondary | ICD-10-CM

## 2023-04-24 DIAGNOSIS — G935 Compression of brain: Secondary | ICD-10-CM

## 2023-04-24 DIAGNOSIS — D333 Benign neoplasm of cranial nerves: Secondary | ICD-10-CM

## 2023-04-24 MED ORDER — GADOBUTROL 10 MMOL/10 ML (1 MMOL/ML) INTRAVENOUS SOLUTION
10.0000 mL | INTRAVENOUS | Status: AC
Start: 2023-04-24 — End: 2023-04-24
  Administered 2023-04-24: 10 mL via INTRAVENOUS

## 2023-04-26 NOTE — Progress Notes (Signed)
 Patient identified by name and date of birth. The following literature has been provided to the patient/ caregiver and they have been given an opportunity to ask questions:  New Patient Packet Documents:  Notice of Sales promotion account executive Patient Billing Information  Patient Care Partnership: Understanding Your Rights and Responsibilities    The medication list has been reviewed and updated with the patient/ caregiver. At the conclusion of today's visit, the patient will receive a copy of an after visit summary which includes any orders entered, current medications and any future appointments. If the patient leaves without receiving the after visit summary, a copy of the above information will be mailed to them.

## 2023-04-27 ENCOUNTER — Ambulatory Visit (INDEPENDENT_AMBULATORY_CARE_PROVIDER_SITE_OTHER): Payer: Self-pay

## 2023-04-27 NOTE — Telephone Encounter (Signed)
 Patient notified of appt date and time.  Sydnee Cabal, PATIENT NAVIGATOR

## 2023-04-30 ENCOUNTER — Ambulatory Visit
Admission: RE | Admit: 2023-04-30 | Discharge: 2023-04-30 | Disposition: A | Discharge status: Home / Self Care | Payer: Medicaid Other | Source: Ambulatory Visit | Attending: NURSE PRACTITIONER | Admitting: NURSE PRACTITIONER

## 2023-04-30 ENCOUNTER — Ambulatory Visit: Payer: Medicaid Other | Attending: Neurological Surgery | Admitting: Neurological Surgery

## 2023-04-30 ENCOUNTER — Other Ambulatory Visit: Payer: Self-pay

## 2023-04-30 DIAGNOSIS — D333 Benign neoplasm of cranial nerves: Secondary | ICD-10-CM

## 2023-04-30 DIAGNOSIS — R42 Dizziness and giddiness: Secondary | ICD-10-CM | POA: Insufficient documentation

## 2023-04-30 NOTE — Progress Notes (Signed)
NEUROSURGERY, PHYSICIAN OFFICE CENTER  [address]  [place] [address]  Operated by [facility]  Telephone Visit    Name:  Caitlyn Gomez MRN: [id number]   Date:  04/30/2023 Age:   47 y.o.     The patient/family initiated a request for telephone service.  Verbal consent for this service was obtained from the patient/family.    Last office visit in this department: 01/25/2023      Reason for call: fu VS  Call notes:  MRI reviewed with patient. No obvious change in small R VS. She does feel dizzy. Will plan for repeat MRI 1.5 years    VS, stable    Total provider time spent with the patient on the phone: 10 minutes.    Suan Halter, MD

## 2023-05-03 DIAGNOSIS — R42 Dizziness and giddiness: Secondary | ICD-10-CM

## 2023-05-07 ENCOUNTER — Ambulatory Visit
Admission: RE | Admit: 2023-05-07 | Discharge: 2023-05-07 | Disposition: A | Discharge status: Home / Self Care | Payer: Medicaid Other | Source: Ambulatory Visit | Attending: Surgery | Admitting: Surgery

## 2023-05-07 ENCOUNTER — Other Ambulatory Visit: Payer: Self-pay

## 2023-05-07 DIAGNOSIS — K5790 Diverticulosis of intestine, part unspecified, without perforation or abscess without bleeding: Secondary | ICD-10-CM | POA: Insufficient documentation

## 2023-05-07 MED ORDER — IOHEXOL 350 MG IODINE/ML INTRAVENOUS SOLUTION
100.0000 mL | INTRAVENOUS | Status: AC
Start: 2023-05-07 — End: 2023-05-07
  Administered 2023-05-07: 75 mL via INTRAVENOUS

## 2023-05-07 MED ORDER — BARIUM SULFATE 2 % (W/V) ORAL SUSPENSION
450.0000 mL | ORAL | Status: AC
Start: 2023-05-07 — End: 2023-05-07
  Administered 2023-05-07: 450 mL via ORAL

## 2023-06-09 ENCOUNTER — Encounter (HOSPITAL_COMMUNITY): Admission: RE | Payer: Self-pay | Source: Ambulatory Visit

## 2023-06-09 ENCOUNTER — Ambulatory Visit (HOSPITAL_COMMUNITY): Admission: RE | Admit: 2023-06-09 | Payer: Medicaid Other | Source: Ambulatory Visit | Admitting: Surgery

## 2023-06-09 SURGERY — COLONOSCOPY
Anesthesia: General

## 2023-06-15 ENCOUNTER — Other Ambulatory Visit (INDEPENDENT_AMBULATORY_CARE_PROVIDER_SITE_OTHER): Payer: Self-pay | Admitting: Surgery

## 2023-06-15 MED ORDER — PEG 3350-ELECTROLYTES 236 GRAM-22.74 GRAM-6.74 GRAM-5.86 GRAM SOLUTION
4.0000 L | Freq: Once | ORAL | 0 refills | Status: AC
Start: 2023-06-15 — End: 2023-06-15

## 2023-06-30 ENCOUNTER — Ambulatory Visit (HOSPITAL_COMMUNITY)
Admission: RE | Admit: 2023-06-30 | Discharge: 2023-06-30 | Disposition: A | Discharge status: Still In Hospital | Payer: Medicaid Other | Source: Ambulatory Visit | Attending: NURSE PRACTITIONER | Admitting: NURSE PRACTITIONER

## 2023-06-30 ENCOUNTER — Other Ambulatory Visit (HOSPITAL_COMMUNITY): Payer: Self-pay | Admitting: NURSE PRACTITIONER

## 2023-06-30 ENCOUNTER — Other Ambulatory Visit: Payer: Self-pay

## 2023-06-30 DIAGNOSIS — M542 Cervicalgia: Secondary | ICD-10-CM | POA: Insufficient documentation

## 2023-06-30 NOTE — PT Evaluation (Signed)
[facility] Medicine [facility]  Outpatient Physical Therapy  [address]  [place], [address]  [phone]  (Fax) [phone]      Physical Therapy Cervical Evaluation    Date: 06/30/2023  Patient's Name: Caitlyn Gomez  Date of Birth: [birth year]  Physical Therapy Evaluation     Evaluating Physical Therapist: Doy Mince PT, DPT   PT diagnosis/Reason for Referral: Cervicalgia  Next Scheduled Physician Appointment: 07/23/23  Allergies/Contraindications: None stated.       SUBJECTIVE  Date of onset: 03/08/21     Mechanism of injury: Patient reports falling at work landing on the left side of her body. Patient notes she tore RC, biceps and a muscle in her back during the fall.     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 Patient notes difficulty with cervical rotation. Patient notes significant pain at CTJ. Patient notes difficulty and weakness with bilateral shoulder flexion. Patient notes tremor with left shoulder flexion. Patient notes they do not want to do chiari surgery at this time. Patient reports balance deficits and notes frequent LOB. Patient notes with standing she can get woozy and lightheaded. Patient reports she follows neurosurgery 1x per year. Patient reports "it feels like my whole body is slowly declining." Patient notes sensations of her head feeling heavy. Patient notes shooting burning pain in bilateral UE and LE. Patient also notes N/T in BUE. 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     PLOF: Patient notes no issues prior to fall.     Past Medical History:   Past Medical History:   Diagnosis Date    A-fib (CMS HCC)     Dr. Kathie Rhodes. [facility]    Abnormal Pap smear     Anxiety     Arthropathy     Asthma     Blastoma (CMS HCC)     ears    Brain tumor (benign) (CMS HCC)     Cervical myelopathy (CMS HCC)     Chiari I malformation (CMS HCC) 2021    Chronic obstructive airway disease (CMS HCC)     Complex  regional pain syndrome I     Coronary artery disease     Depression     Depression 10/05/2021    Dysplasia of cervix     Dysrhythmias     a fib    Ectopic pregnancy     Fibromyalgia     GERD (gastroesophageal reflux disease)     controlled with med    History of anesthesia complications     aspirated for scope last time    History of cervical cancer     HTN (hypertension)     HTN (hypertension) 10/05/2021    Hyperlipidemia     NAFLD (nonalcoholic fatty liver disease)     Neck mass     PCOS (polycystic ovarian syndrome)     Radiculopathy     Smoking addiction     Stroke (CMS [facility])     TIA-  - 2016 - (left facial droop and left arm weakness)    Thyroid disorder     nodule    Thyroid nodule     Varicosities          Past Surgical History:   Past Surgical History:   Procedure Laterality Date    BLADDER SURGERY  2023    mesh placed    CYSTOSCOPY  11/04/2022    cystoscopy, retropubic mid urethral sling  HX APPENDECTOMY      HX DILATION AND CURETTAGE      HX ENDOMETRIAL ABLATION      HX HEART CATHETERIZATION      stent    HX HYSTERECTOMY      HX TONSILLECTOMY      HX TUBAL LIGATION         Previous episodes/treatments: Previous PT for s/p left shoulder. Patient denies injections.     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     Pain increases with: POSITION CHANGE, STAND, WALK, and Turning her head           decreases with : HEAT, COLD, and MASSAGE    Sensation: Patient notes tingling and pain with light pressure    Weakness: BUE    Sleep affected: Patient reports she is up and down all night due to her neck and back pain.     Headaches: Patient reports intense facial headaches than can be some what relieved with gentle pressure. Patient notes frequency varies from 2-3x per week to daily.     Dizziness: Patient reports when walking she drifts to her right. Patient notes sensations of the room spinning when she he has her dizziness spells. Patient notes at time she can experience this sometimes as often as 3x per day. Patient reports typically this occurs 3x per week. Patient notes history of vestibular tumor.    Subjective Functional Reports:    Sitting: LIMITED    Standing: LIMITED    Walking: LIMITED    Lifting: LIMITED        Patient-Specific Functional Score:    Problem Score   1. Turning her head 0   2. Walk with her head up 3   3. Drinking from a cup  1   Total score = sum of the activity scores/number of activities    Minimal detectable change (90% CI) for avg score = 2 points    Minimal detectable change (90% CI) for single activity score = 3 points  1.3         OBJECTIVE    ROM   right left   rotation 10 15   sidebend 20 15   flexion 20 -------------------------   extension 35 -------------------------       Strength     right left   Shrug (C3-4) 4-/5 4-/5   Shoulder abduction (C5) 4-/5 4-/5   Elbow flexion (C6) 4-/5 4-/5   Elbow extension (C7) 4-/5 4-/5   Wrist extension (C6) 4-/5 4-/5   Wrist flexion (C7) 4-/5 4-/5   Thumb  extension (C8) 4-/5 4-/5   Finger abduction (T1) 4-/5 4-/5     Palpation: TTP throughout bilateral upper trap, intra scapular region, bilateral pecs and scalenes    Posture: Forward head, rounded shoulder, increased thoracic kyphosis and decreased lumbar  lordosis    Reflexes    Biceps Absent L, 1+ R   Triceps Absent L, 1+ R   Brachioradialis Absent L, 1+ R   Hoffman's  ( - ) B   Clonus UE/LE ( - )  BUE/BLE     Special tests:       Treatment provided:REVIEW OF POC AND GOALS WITH PATIENT, ALL QUESTIONS ANSWERED, PATIENT EDUCATION, and THERAPEUTIC EXERCISE     UVO:[id number] Code: [email or web address]  URL: [email or web address]  Date: 06/30/2023  Prepared by: Doy Mince    Exercises  - Seated Cervical Rotation AROM  - 1 x daily - 7 x weekly - 1-2 sets - 10 reps - 5-10 seconds hold  - Seated Cervical Sidebending AROM  - 1 x daily - 7 x weekly - 1-2 sets - 10 reps - 5-10 seconds hold  - Supine Cervical Retraction with Towel  - 1 x daily - 5 x weekly - 2 sets - 10 reps  - Standing Isometric Cervical Extension with Manual Resistance  - 1 x daily - 5 x weekly - 1 sets - 10 reps - 5 seconds hold      ASSESSMENT    Impression: Patient is a 47 year old female referred to PT secondary to cervicalgia. Static assessment revealed: forward head, rounded shoulders, increased thoracic kyphosis and decreased lumbar lordosis. Dynamic assessment revealed: Decreased pain free cervical AROM in all planes, impaired gross shoulder mobility L > R, impaired BUE gross muscle strength, asymmetrical DTR with absent reflexes noted L and TTP throughout cervical and thoracic region. Patient presents with high pain presentation with visible facial grimacing and at times tears noted throughout examination. Patient also presents with phyco social yellow flags citing the increased mental toll her symptoms are taking on her. Prognosis is guarded at this time due to progressive nature of symptoms and high pain presentation. Patient will benefit from skilled PT to reduce subjective complaints, improve pain free CS AROM, improve BUE gross muscle strength, and improve postural set in order to optimize functional mobility and maximize functional independence.      Rehab potential: FAIR    Goals:    Short-Term  Goals: (3 Weeks):     - Patient will demonstrate improved cervical AROM rotation to 25 degrees bilaterally    -  Patient will demonstrate independence with progressive HEP to maximize gains from PT.      - Patient will report max 6/10 pain to aid in completion of ADLs/work duties.           Long-Term Goals: (6 Weeks):     - Patient will demonstrate improved bilateral UE strength of at least 4+/5 to aid in completion of ADLs.     - Patient will demonstrate improved cervical ROT 40 degrees bilaterally    -  Patient will demonstrate improved cervical side bending to >= 25 degrees bilaterally    -  Patient will report max 4/10 pain to aid in completion of ADLs/work duties.     -  Patient will demonstrate improved functional ability in daily life via improved Patient Specific Functional score of at least 4.             PLAN  Patient will attend 2 times per week  x 6 weeks. Therapy may include, but is not limited to THERAPEUTIC EXERCISES, MYOFASCIAL/JOINT MOBILIZATION, POSTURE/BODY MECHANICS, HOME INSTRUCTIONS, and HEAT/COLD    Plan for next visit: gentle cervical AROM, chin tucks, scap retraction, shoulder ext, manual therapy as tolerated.        Evaluation complexity:   Personal factors impacting POC: FREQUENT OR CHRONIC PAIN and PRE-EXISTING FUNCTIONAL LIMITATIONS   Co-morbidities impacting POC: OBESITY and PREVIOUS SURGERIES  Complexity of physical exam: INCLUDING MUSCULOSKELETAL SYSTEM (POSTURE, ROM, STRENGTH, HEIGHT/WEIGHT), INCLUDING NEUROMUSCULAR EXAM (BALANCE, GAIT, LOCOMOTION, MOBILITY), and REFERRAL IS FOR A CHRONIC PROBLEM   Clinical Presentation: STABLE   Evaluation Complexity: LOW-HISTORY 0, EXAMINATION 1-2, STABLE PRESENTATION        Total Session Time 60, Timed code minutes 15, and Untimed code minutes 45        Intervention minutes: EVALUATION 45 minutes and THERAPEUTIC EXERCISE 15 minutes    Doy Mince, PT  06/30/2023, 09:33                Certification:    From:______  Through:_________    I certify the  need for these services furnished under this plan of treatment and while under my care.    Referring Provider Signature: _______________     Date : _____________________

## 2023-07-06 ENCOUNTER — Ambulatory Visit (HOSPITAL_COMMUNITY): Payer: Self-pay

## 2023-07-08 ENCOUNTER — Ambulatory Visit (HOSPITAL_COMMUNITY)
Admission: RE | Admit: 2023-07-08 | Discharge: 2023-07-08 | Disposition: A | Discharge status: Still In Hospital | Payer: Medicaid Other | Source: Ambulatory Visit

## 2023-07-08 ENCOUNTER — Other Ambulatory Visit: Payer: Self-pay

## 2023-07-08 NOTE — PT Treatment (Signed)
[facility] Medicine [facility]  Outpatient Physical Therapy  [address]  [place], [address]  [phone]  (Fax) [phone]    Physical Therapy Treatment Note    Date: 07/08/2023  Patient's Name: Caitlyn Gomez  Date of Birth: 12/17/1975  Physical Therapy Visit            Visit #/POC:2/12  Authorization:8 till 12/31  POC Signed?: NA  POC Ends: 1/22  Order Ends: Open Order  Next Progress Note Due: on or before 07/28/23    Evaluating Physical Therapist: Doy Mince PT, DPT   PT diagnosis/Reason for Referral: Cervicalgia  Next Scheduled Physician Appointment: 07/23/23  Allergies/Contraindications: None stated.           Subjective: Patient reports she sneezed at work today which caused her right ear to pop and since that time she has increased difficulty with balance. Patient notes vertigo like symptoms when she initially felt the pop. Patient notes fair tolerance to HEP but notes difficulty with chin tucks. Patient notes she has contacted neurosurgery to move up appointment. Patient notes continued progress of fatigue and pain. Patient notes she is alternating ice and heat with some relief.    Objective: Activity per flowsheet    Measured ROM: Not assessed this date   EXERCISE/ACTIVITY NAME REPETITIONS RESISTANCE COMPLETED THIS DOS   Chin tucks  Supine  Deep cervical Flexor hold 2x10  5x5 seconds  Y  Y   Scap retraction  2x10  Y   Seated cervical rotation   Seated cervical SB      Cervical isometric ext  15x3"   Y   TB shld ext 10x Yellow Y   TB horizontal ABD  10x Yellow Y   Manual Therapy:   Suboccipital release   Manual pec stretch    Y                     Assessment: Patient demos fair tolerance to exercise program this date. Patient notes weakness/pain in L shoulder with TB exercises. Patient requires frequent VC/TC for posture and form.     Goals:     Short-Term Goals: (3 Weeks):     - Patient will demonstrate improved cervical AROM rotation to 25 degrees bilaterally    -  Patient  will demonstrate independence with progressive HEP to maximize gains from PT.      - Patient will report max 6/10 pain to aid in completion of ADLs/work duties.           Long-Term Goals: (6 Weeks):     - Patient will demonstrate improved bilateral UE strength of at least 4+/5 to aid in completion of ADLs.     - Patient will demonstrate improved cervical ROT 40 degrees bilaterally    -  Patient will demonstrate improved cervical side bending to >= 25 degrees bilaterally    -  Patient will report max 4/10 pain to aid in completion of ADLs/work duties.     -  Patient will demonstrate improved functional ability in daily life via improved Patient Specific Functional score of at least 4.     Plan:  Monitor patient response and progress as tolerated.     Total Session Time 35 and Timed code minutes 35  THERAPEUTIC EXERCISE 25 minutes and JOINT MOBILIZATION/MFR 10 minutes      Doy Mince, PT  07/08/2023, 16:43

## 2023-07-12 ENCOUNTER — Ambulatory Visit (HOSPITAL_COMMUNITY)
Admission: RE | Admit: 2023-07-12 | Discharge: 2023-07-12 | Disposition: A | Discharge status: Still In Hospital | Payer: Medicaid Other | Source: Ambulatory Visit

## 2023-07-12 ENCOUNTER — Other Ambulatory Visit: Payer: Self-pay

## 2023-07-12 NOTE — PT Treatment (Signed)
[facility] Medicine [facility]  Outpatient Physical Therapy  [address]  [place], [address]  [phone]  (Fax) [phone]    Physical Therapy Treatment Note    Date: 07/12/2023  Patient's Name: Caitlyn Gomez  Date of Birth: 1975-07-26  Physical Therapy Visit          Visit #/POC:3/12  Authorization:8 till 12/31  POC Signed?: NA  POC Ends: 1/22  Order Ends: Open Order  Next Progress Note Due: on or before 07/28/23     Evaluating Physical Therapist: Doy Mince PT, DPT   PT diagnosis/Reason for Referral: Cervicalgia  Next Scheduled Physician Appointment: 07/23/23  Allergies/Contraindications: None stated.               Subjective: Patient reports she feels "okay and less dizzy" this date. Patient does note pains going down her arms and legs prior. Patient reports she will see and ear doctor the second week of January. Patient notes she was sore and fatigued following initial treatment session. Patient ~ 10 minutes late to today's session.      Objective: Activity per flowsheet     Measured ROM: Not assessed this date   EXERCISE/ACTIVITY NAME REPETITIONS RESISTANCE COMPLETED THIS DOS   Chin tucks  Supine  Deep cervical Flexor hold 2x10  2x5x5 seconds   Y  Y   Scap retraction  2x10   Y   Seated cervical rotation   Seated cervical SB         Cervical isometric ext  15x3"    Y   TB shld ext 2x10 Yellow Y   TB horizontal ABD  2x10 Yellow Y   Manual Therapy:   Suboccipital release   Manual pec stretch      Y                                Assessment: Patient demos improved tolerance to exercises this date. Patient continues to require VC/TC for proper posture and exercise form.      Goals:     Short-Term Goals: (3 Weeks):     - Patient will demonstrate improved cervical AROM rotation to 25 degrees bilaterally    -  Patient will demonstrate independence with progressive HEP to maximize gains from PT.      - Patient will report max 6/10 pain to aid in completion of ADLs/work duties.            Long-Term Goals: (6 Weeks):     - Patient will demonstrate improved bilateral UE strength of at least 4+/5 to aid in completion of ADLs.     - Patient will demonstrate improved cervical ROT 40 degrees bilaterally    -  Patient will demonstrate improved cervical side bending to >= 25 degrees bilaterally    -  Patient will report max 4/10 pain to aid in completion of ADLs/work duties.     -  Patient will demonstrate improved functional ability in daily life via improved Patient Specific Functional score of at least 4.      Plan:  Continue to monitor patient response and progress as tolerated.     Total Session Time 35 and Timed code minutes 35  THERAPEUTIC EXERCISE 25 minutes and JOINT MOBILIZATION/MFR 10 minutes      Doy Mince, PT  07/12/2023, 08:10

## 2023-07-15 ENCOUNTER — Ambulatory Visit (HOSPITAL_COMMUNITY)
Admission: RE | Admit: 2023-07-15 | Discharge: 2023-07-15 | Disposition: A | Discharge status: Still In Hospital | Payer: 59 | Source: Ambulatory Visit

## 2023-07-15 NOTE — PT Treatment (Signed)
[facility] Medicine [facility]  Outpatient Physical Therapy  [address]  [place], [address]  [phone]  (Fax) [phone]    Physical Therapy Treatment Note    Date: 07/15/2023  Patient's Name: Caitlyn Gomez  Date of Birth: 03-07-1976  Physical Therapy Visit           Visit #/POC:3/12  Authorization:3 of 8 till 12/31  POC Signed?: NA  POC Ends: 1/22  Order Ends: Open Order  Next Progress Note Due: on or before 07/28/23     Evaluating Physical Therapist: Doy Mince PT, DPT   PT diagnosis/Reason for Referral: Cervicalgia  Next Scheduled Physician Appointment: 07/23/23  Allergies/Contraindications: None stated.               Subjective: Patient reports she is having a lot of head, neck, and shoulder pain. States that she has taken 2 Lyrica today with no relief.  Rates pain 6/10. States she does have some dizziness today. States when at work yesterday she wasn't even able to hold her head up and they made her husband come get her from work.      Objective: Activity per flowsheet     Measured ROM: Not assessed this date   EXERCISE/ACTIVITY NAME REPETITIONS RESISTANCE COMPLETED THIS DOS   Chin tucks  Supine  Deep cervical Flexor hold 2x10  2x5x5 seconds   Y  n   Scap retraction  2x10   Y   Seated cervical rotation   Seated cervical SB      n  n   Cervical isometric ext  15x3"    n   TB shld ext 2x10 Yellow Y   TB horizontal ABD  2x10 Yellow Y   Manual Therapy:   Suboccipital release   Manual pec stretch seated     Y    y   Thoracic ext in chair      y                      Assessment: Patient tolerated fair. She was very emotional throughout session(crying) verbally expressing how she is worried because the pain and dizziness has gotten worse. Encouraged patient to continue to call MD. Also recommended to go to ER of the pain and dizziness is so bad she can't raise her head up. Verbal cueing with all therex for proper technique.      Goals:     Short-Term Goals: (3 Weeks):     - Patient will  demonstrate improved cervical AROM rotation to 25 degrees bilaterally    -  Patient will demonstrate independence with progressive HEP to maximize gains from PT.      - Patient will report max 6/10 pain to aid in completion of ADLs/work duties.           Long-Term Goals: (6 Weeks):     - Patient will demonstrate improved bilateral UE strength of at least 4+/5 to aid in completion of ADLs.     - Patient will demonstrate improved cervical ROT 40 degrees bilaterally    -  Patient will demonstrate improved cervical side bending to >= 25 degrees bilaterally    -  Patient will report max 4/10 pain to aid in completion of ADLs/work duties.     -  Patient will demonstrate improved functional ability in daily life via improved Patient Specific Functional score of at least 4.      Plan:  Continue to monitor patient response and  progress as tolerated.            Total Session Time 30 and Timed code minutes 30  THERAPEUTIC EXERCISE 10 minutes and JOINT MOBILIZATION/MFR 20 minutes      Trilby Leaver, PTA  07/15/2023, 16:58

## 2023-07-20 ENCOUNTER — Ambulatory Visit
Admission: RE | Admit: 2023-07-20 | Discharge: 2023-07-20 | Disposition: A | Discharge status: Still In Hospital | Payer: 59 | Source: Ambulatory Visit | Attending: NURSE PRACTITIONER | Admitting: NURSE PRACTITIONER

## 2023-07-20 ENCOUNTER — Other Ambulatory Visit: Payer: Self-pay

## 2023-07-20 NOTE — PT Treatment (Signed)
[facility] Medicine [facility]  Outpatient Physical Therapy  [address]  [place], [address]  [phone]  (Fax) [phone]    Physical Therapy Treatment Note    Date: 07/20/2023  Patient's Name: Caitlyn Gomez  Date of Birth: 02-Jan-1976  Physical Therapy Visit         Visit #/POC:5/12  Authorization:5 of 8 till 12/31  POC Signed?: NA  POC Ends: 1/22  Order Ends: Open Order  Next Progress Note Due: on or before 07/28/23     Evaluating Physical Therapist: Doy Mince PT, DPT   PT diagnosis/Reason for Referral: Cervicalgia  Next Scheduled Physician Appointment: 07/23/23  Allergies/Contraindications: None stated.               Subjective: Patient rates pain 4/10. States  she continues to take her Lyrica. States that she is feeling better today than the last visit because she has not worked today. States that after she works a shift her pain increases to 8/10. States her MD has ordered a MRI for her head and neck. States that she is just waiting on the appointment date/time.     Objective: Activity per flowsheet     Measured ROM: Not assessed this date   EXERCISE/ACTIVITY NAME REPETITIONS RESISTANCE COMPLETED THIS DOS   Chin tucks  Supine  Deep cervical Flexor hold 2x10  2x5x5 seconds   Y  n   Scap retraction  x10   Y   Seated cervical rotation   Seated cervical SB      n  n   Cervical isometric ext  X5 holding 3 sec all planes   n   TB shld ext x20 Yellow Y   TB horizontal ABD  2x10 Yellow Y   Manual Therapy:   Suboccipital release   Manual pec stretch seated     Y     y   Thoracic ext in chair  x10    y                      Assessment: Patient tolerated fair. She does require visual cueing with proper technique. No adverse effects reported form treatment.      Goals:     Short-Term Goals: (3 Weeks):     - Patient will demonstrate improved cervical AROM rotation to 25 degrees bilaterally    -  Patient will demonstrate independence with progressive HEP to maximize gains from PT.      -  Patient will report max 6/10 pain to aid in completion of ADLs/work duties.           Long-Term Goals: (6 Weeks):     - Patient will demonstrate improved bilateral UE strength of at least 4+/5 to aid in completion of ADLs.     - Patient will demonstrate improved cervical ROT 40 degrees bilaterally    -  Patient will demonstrate improved cervical side bending to >= 25 degrees bilaterally    -  Patient will report max 4/10 pain to aid in completion of ADLs/work duties.     -  Patient will demonstrate improved functional ability in daily life via improved Patient Specific Functional score of at least 4.      Plan:  Continue to monitor patient response and progress as tolerated.               Total Session Time 30 and Timed code minutes 30  THERAPEUTIC EXERCISE 10 minutes and JOINT MOBILIZATION/MFR 20  minutes      Trilby Leaver, PTA  07/20/2023, 08:50

## 2023-07-22 ENCOUNTER — Ambulatory Visit (HOSPITAL_COMMUNITY)
Admission: RE | Admit: 2023-07-22 | Discharge: 2023-07-22 | Disposition: A | Discharge status: Still In Hospital | Payer: 59 | Source: Ambulatory Visit

## 2023-07-22 DIAGNOSIS — M542 Cervicalgia: Secondary | ICD-10-CM | POA: Insufficient documentation

## 2023-07-22 NOTE — PT Treatment (Addendum)
[facility] Medicine [facility]  Outpatient Physical Therapy  [address]  [place], [address]  [phone]  (Fax) [phone]    Physical Therapy Treatment Note    Date: 07/22/2023  Patient's Name: Caitlyn Gomez  Date of Birth: [birth year]  Physical Therapy Visit         Visit #/POC:5/12  Authorization:5 of 8 till 12/31  POC Signed?: NA  POC Ends: 1/22  Order Ends: Open Order  Next Progress Note Due: on or before 07/28/23     Evaluating Physical Therapist: Doy Mince PT, DPT   PT diagnosis/Reason for Referral: Cervicalgia  Next Scheduled Physician Appointment: 07/23/23  Allergies/Contraindications: None stated.               Subjective: Patient states that she has not received an appointment for her MRI. States that she will have a follow up visit with her PCP tomorrow. Rates pain today 4/10.      Objective: Activity per flowsheet     Measured ROM: Not assessed this date   EXERCISE/ACTIVITY NAME REPETITIONS RESISTANCE COMPLETED THIS DOS   Chin tucks  Supine  Deep cervical Flexor hold 2x10  2x5x5 seconds   Y  n   Scap retraction  x10   Y   Seated cervical rotation   Seated cervical SB      n  n   Cervical isometric ext  X5 holding 3 sec all planes   n   TB shld ext X15 standing Red TB Y   TB horizontal ABD  X15 standing Red TB Y   Manual Therapy:   Suboccipital release   Manual pec stretch seated     Y     y   Thoracic ext in chair  x10    y    thoracic rotation with head neutral  x10    y   Standing rows x15 Red TB y            Assessment: Patient tolerated well. She had slight improvement in  cervical mobility. Initiated thoracic rotation with head midline and rows  with good response. Also tolerated increase in resistance with shoulder ext and abd.     Goals:     Short-Term Goals: (3 Weeks):     - Patient will demonstrate improved cervical AROM rotation to 25 degrees bilaterally    -  Patient will demonstrate independence with progressive HEP to maximize gains from PT.      - Patient will  report max 6/10 pain to aid in completion of ADLs/work duties.           Long-Term Goals: (6 Weeks):     - Patient will demonstrate improved bilateral UE strength of at least 4+/5 to aid in completion of ADLs.     - Patient will demonstrate improved cervical ROT 40 degrees bilaterally    -  Patient will demonstrate improved cervical side bending to >= 25 degrees bilaterally    -  Patient will report max 4/10 pain to aid in completion of ADLs/work duties.     -  Patient will demonstrate improved functional ability in daily life via improved Patient Specific Functional score of at least 4.      Plan:  Continue to monitor patient response and progress as tolerated.            Total Session Time 30 and Timed code minutes 30  THERAPEUTIC EXERCISE 22 minutes and JOINT MOBILIZATION/MFR 8 minutes  Trilby Leaver, PTA  07/22/2023, 17:03

## 2023-07-27 ENCOUNTER — Ambulatory Visit (HOSPITAL_COMMUNITY): Payer: Self-pay

## 2023-07-29 ENCOUNTER — Ambulatory Visit (HOSPITAL_COMMUNITY)
Admission: RE | Admit: 2023-07-29 | Discharge: 2023-07-29 | Disposition: A | Discharge status: Still In Hospital | Payer: 59 | Source: Ambulatory Visit

## 2023-07-29 DIAGNOSIS — M542 Cervicalgia: Secondary | ICD-10-CM

## 2023-07-29 NOTE — PT Treatment (Addendum)
[facility] Medicine [facility]  Outpatient Physical Therapy  [address]  [place], [address]  [phone]  (Fax) [phone]    Physical Therapy Treatment Note    Date: 07/29/2023  Patient's Name: Caitlyn Gomez  Date of Birth: 08-05-1975  Physical Therapy Visit         Visit #/POC:7/12  Authorization:2/20 previous auth(5 of 8 till 12/31)  POC Signed?: NA  POC Ends: 1/22  Order Ends: Open Order  Next Progress Note Due: on or before 07/28/23     Evaluating Physical Therapist: Doy Mince PT, DPT   PT diagnosis/Reason for Referral: Cervicalgia  Next Scheduled Physician Appointment: 07/23/23  Allergies/Contraindications: None stated.               Subjective: Patient states that she is scheduled to see a neurologist(does not know date/time). States she seen her PCP yesterday and  that they did increase her Lyrica from 75 mg to 100 mg(will start it tonight). States that she will also go to the pain clinic the first of March. States that 3/11 she has an appointment for the ear pain. Reports pain  today her face, (B) arms, and neck. Rates pain 7/10.      Objective: Activity per flowsheet     Measured ROM: Not assessed this date   EXERCISE/ACTIVITY NAME REPETITIONS RESISTANCE COMPLETED THIS DOS   Chin tucks  Supine  Deep cervical Flexor hold 2x10  2x5x5 seconds   Y  n   Scap retraction  x10   Y   Seated cervical rotation   Seated cervical SB      n  n   Cervical isometric ext  X5 holding 3 sec all planes   n   TB shld ext X15 standing Red TB Y   TB horizontal ABD  X15 seated Red TB Y   Manual Therapy:   UT  Suboccipital release   Manual pec stretch seated     Y(gentle)     n   Thoracic ext in chair  x10    y    thoracic rotation with head neutral  x10    y   Standing rows x15 Red TB y            Assessment: Patient tolerated fair at low level intensity. Modified treatment 2/2 increased pain and increased radicular symptoms of pain in (B) UE. Noted patient continuously pressed on  her face during  treatment reporting its the only way the facial pain decreases. She fatigued quickly and required several rest breaks. Patient has made minimal to no progress towards goals.      Goals:     Short-Term Goals: (3 Weeks):     - Patient will demonstrate improved cervical AROM rotation to 25 degrees bilaterally    -  Patient will demonstrate independence with progressive HEP to maximize gains from PT.      - Patient will report max 6/10 pain to aid in completion of ADLs/work duties.           Long-Term Goals: (6 Weeks):     - Patient will demonstrate improved bilateral UE strength of at least 4+/5 to aid in completion of ADLs.     - Patient will demonstrate improved cervical ROT 40 degrees bilaterally    -  Patient will demonstrate improved cervical side bending to >= 25 degrees bilaterally    -  Patient will report max 4/10 pain to aid in completion of ADLs/work duties.     -  Patient will demonstrate improved functional ability in daily life via improved Patient Specific Functional score of at least 4.      Plan:  Continue to monitor patient response and progress as tolerated.                    Total Session Time 28 and Timed code minutes 28  THERAPEUTIC EXERCISE 28 minutesMFR included in time      Trilby Leaver, PTA  07/29/2023, 17:01

## 2023-08-03 ENCOUNTER — Ambulatory Visit (HOSPITAL_COMMUNITY): Payer: Self-pay

## 2023-08-05 ENCOUNTER — Ambulatory Visit
Admission: RE | Admit: 2023-08-05 | Discharge: 2023-08-05 | Disposition: A | Discharge status: Still In Hospital | Payer: Medicaid Other | Source: Ambulatory Visit | Attending: NURSE PRACTITIONER

## 2023-08-05 ENCOUNTER — Other Ambulatory Visit: Payer: Self-pay

## 2023-08-05 NOTE — Progress Notes (Signed)
[facility] Medicine [facility]  Outpatient Physical Therapy  [address]  [place], [address]  [phone]  (Fax) [phone]    Physical Therapy Progress Note    Date: 08/05/2023  Patient's Name: Caitlyn Gomez  Date of Birth: Jun 10, 1976  Physical Therapy Progress Note         Visit #/POC:8/12  Authorization:3/20 previous auth(5 of 8 till 12/31)  POC Signed?: NA  POC Ends: 1/22  Order Ends: Open Order  Next Progress Note Due: on or before 07/28/23     Evaluating Physical Therapist: Doy Mince PT, DPT   PT diagnosis/Reason for Referral: Cervicalgia  Next Scheduled Physician Appointment: 07/23/23  Allergies/Contraindications: None stated.         Subjective: Patient reports increased dosage of Lyrica has helped to manage pain. Patient reports she sees ear specialist tomorrow in [place]. Patient reports pain to be 4/10 prior to today's session.      Objective: Activity per flowsheet     Measured ROM:    Patient-Specific Functional Score:     Problem Score IE 1/16   1. Turning her head 0 6   2. Walk with her head up 3 2   3. Drinking from a cup  1 4   Total score = sum of the activity scores/number of activities    Minimal detectable change (90% CI) for avg score = 2 points    Minimal detectable change (90% CI) for single activity score = 3 points  1.3 4                                                          OBJECTIVE     ROM    Right IE  Right 1/16 Left IE   Left 1/16   rotation 10 30 15 28    sidebend 20 35 15 20   flexion 20 35 NA NA   extension 35 30 NA NA         Strength       Right IE  Right 1/16 Left IE  Left 1/16   Shrug (C3-4) 4-/5 4/5 4-/5 4/5   Shoulder abduction (C5) 4-/5 4-/5 4-/5 4/5   Elbow flexion (C6) 4-/5 4+/5 4-/5 4+/5   Elbow extension (C7) 4-/5 4+/5 4-/5 4+/5   Wrist extension (C6) 4-/5 4/5 4-/5 4/5   Wrist flexion (C7) 4-/5 4/5 4-/5 4/5   Thumb extension (C8) 4-/5 4-/5 4-/5 4-/5   Finger abduction (T1) 4-/5 4-/5 4-/5 4-/5        Pain IE 1/16   Now  6 4   Best 3 2    Worst 10 7         EXERCISE/ACTIVITY NAME REPETITIONS RESISTANCE COMPLETED THIS DOS   Chin tucks  Supine  Deep cervical Flexor hold 2x10  2x5x5 seconds   Y  n   Scap retraction  x10   Y   Seated cervical rotation   Seated cervical SB      n  n   Cervical isometric ext  X5 holding 3 sec all planes   n   TB shld ext X15 standing Red TB Y   TB horizontal ABD  X15 seated Red TB Y   Manual Therapy:   UT  Suboccipital release   Manual pec stretch  seated     Y(gentle)     n   Thoracic ext in chair  x10    y    thoracic rotation with head neutral  x10    y   Standing rows x15 Red TB y            Assessment: Patient has made fair-poor progress towards established short and long term PT goals. Patient does report a reduction in overall pain and improved perception of function per PSFS. Patient demos improved cervical AROM compared to baseline but remains limited with end range movements most notably rotation and side bending. Patient also demos improved proximal BUE gross muscle strength R > L. However despite these improvements patient continues to have significant limitations with ADLs, IADLs and work related activities. Progression of PT program has been limited due to patient's poor tolerance to low intensity exercise/manual therapy. Feel further medical management of patient's symptoms is warranted at this point. Patient will complete one additional visit of PT next week and will be D/C from formal PT at that time.      Goals:     Short-Term Goals: (3 Weeks):     - Patient will demonstrate improved cervical AROM rotation to 25 degrees bilaterally ( PROGRESSING 08/05/23 )     -  Patient will demonstrate independence with progressive HEP to maximize gains from PT. ( MET 08/05/23 )      - Patient will report max 6/10 pain to aid in completion of ADLs/work duties. ( Progressing 08/05/23 )           Long-Term Goals: (6 Weeks):     - Patient will demonstrate improved bilateral UE strength of at least 4+/5 to aid in completion  of ADLs. ( PROGRESSING 08/05/23 )     - Patient will demonstrate improved cervical ROT 40 degrees bilaterally ( NOT MET 08/05/23 )     -  Patient will demonstrate improved cervical side bending to >= 25 degrees bilaterally ( PROGRESSING 08/05/23 )     -  Patient will report max 4/10 pain to aid in completion of ADLs/work duties. ( NOT MET 08/05/23 )     -  Patient will demonstrate improved functional ability in daily life via improved Patient Specific Functional score of at least 4. ( MET 08/05/23 )      Plan:  Complete one additional visit next week to finish POC.        Total Session Time 40 and Timed code minutes 40  THERAPEUTIC EXERCISE 40 minutes      Doy Mince, PT  08/05/2023, 16:49

## 2023-08-10 ENCOUNTER — Ambulatory Visit (HOSPITAL_COMMUNITY): Payer: Self-pay

## 2023-08-11 ENCOUNTER — Ambulatory Visit
Admission: RE | Admit: 2023-08-11 | Discharge: 2023-08-11 | Disposition: A | Discharge status: Home / Self Care | Payer: Medicaid Other | Source: Ambulatory Visit | Attending: Surgery | Admitting: Surgery

## 2023-08-11 ENCOUNTER — Ambulatory Visit (HOSPITAL_COMMUNITY): Payer: 59 | Admitting: Surgery

## 2023-08-11 ENCOUNTER — Other Ambulatory Visit: Payer: Self-pay

## 2023-08-11 ENCOUNTER — Encounter (HOSPITAL_COMMUNITY)
Admission: RE | Disposition: A | Discharge status: Home / Self Care | Payer: Self-pay | Source: Ambulatory Visit | Attending: Surgery

## 2023-08-11 ENCOUNTER — Ambulatory Visit (HOSPITAL_COMMUNITY): Payer: 59

## 2023-08-11 ENCOUNTER — Encounter (HOSPITAL_COMMUNITY): Payer: Self-pay | Admitting: Surgery

## 2023-08-11 DIAGNOSIS — F419 Anxiety disorder, unspecified: Secondary | ICD-10-CM | POA: Insufficient documentation

## 2023-08-11 DIAGNOSIS — F32A Depression, unspecified: Secondary | ICD-10-CM | POA: Insufficient documentation

## 2023-08-11 DIAGNOSIS — I2510 Atherosclerotic heart disease of native coronary artery without angina pectoris: Secondary | ICD-10-CM | POA: Insufficient documentation

## 2023-08-11 DIAGNOSIS — K5730 Diverticulosis of large intestine without perforation or abscess without bleeding: Secondary | ICD-10-CM | POA: Insufficient documentation

## 2023-08-11 DIAGNOSIS — Z8673 Personal history of transient ischemic attack (TIA), and cerebral infarction without residual deficits: Secondary | ICD-10-CM | POA: Insufficient documentation

## 2023-08-11 DIAGNOSIS — K760 Fatty (change of) liver, not elsewhere classified: Secondary | ICD-10-CM | POA: Insufficient documentation

## 2023-08-11 DIAGNOSIS — E785 Hyperlipidemia, unspecified: Secondary | ICD-10-CM | POA: Insufficient documentation

## 2023-08-11 DIAGNOSIS — J4489 Other specified chronic obstructive pulmonary disease: Secondary | ICD-10-CM | POA: Insufficient documentation

## 2023-08-11 DIAGNOSIS — I10 Essential (primary) hypertension: Secondary | ICD-10-CM | POA: Insufficient documentation

## 2023-08-11 DIAGNOSIS — F17210 Nicotine dependence, cigarettes, uncomplicated: Secondary | ICD-10-CM | POA: Insufficient documentation

## 2023-08-11 DIAGNOSIS — Z7951 Long term (current) use of inhaled steroids: Secondary | ICD-10-CM | POA: Insufficient documentation

## 2023-08-11 DIAGNOSIS — I499 Cardiac arrhythmia, unspecified: Secondary | ICD-10-CM | POA: Insufficient documentation

## 2023-08-11 DIAGNOSIS — I4891 Unspecified atrial fibrillation: Secondary | ICD-10-CM | POA: Insufficient documentation

## 2023-08-11 DIAGNOSIS — K641 Second degree hemorrhoids: Secondary | ICD-10-CM | POA: Insufficient documentation

## 2023-08-11 DIAGNOSIS — M797 Fibromyalgia: Secondary | ICD-10-CM | POA: Insufficient documentation

## 2023-08-11 DIAGNOSIS — K219 Gastro-esophageal reflux disease without esophagitis: Secondary | ICD-10-CM | POA: Insufficient documentation

## 2023-08-11 SURGERY — COLONOSCOPY
Anesthesia: General | Wound class: Clean Contaminated Wounds-The respiratory, GI, Genital, or urinary

## 2023-08-11 MED ORDER — PROPOFOL 10 MG/ML INTRAVENOUS EMULSION
INTRAVENOUS | Status: AC
Start: 2023-08-11 — End: 2023-08-11
  Filled 2023-08-11: qty 20

## 2023-08-11 MED ORDER — LIDOCAINE (PF) 100 MG/5 ML (2 %) INTRAVENOUS SYRINGE
INJECTION | Freq: Once | INTRAVENOUS | Status: DC | PRN
Start: 2023-08-11 — End: 2023-08-11
  Administered 2023-08-11: 100 mg via INTRAVENOUS

## 2023-08-11 MED ORDER — LACTATED RINGERS INTRAVENOUS SOLUTION
INTRAVENOUS | Status: DC | PRN
Start: 2023-08-11 — End: 2023-08-11

## 2023-08-11 MED ORDER — PROPOFOL 10 MG/ML IV BOLUS
INJECTION | Freq: Once | INTRAVENOUS | Status: DC | PRN
Start: 2023-08-11 — End: 2023-08-11
  Administered 2023-08-11 (×2): 20 mg via INTRAVENOUS
  Administered 2023-08-11: 40 mg via INTRAVENOUS
  Administered 2023-08-11: 20 mg via INTRAVENOUS
  Administered 2023-08-11: 60 mg via INTRAVENOUS
  Administered 2023-08-11: 20 mg via INTRAVENOUS
  Administered 2023-08-11 (×2): 25 mg via INTRAVENOUS
  Administered 2023-08-11: 20 mg via INTRAVENOUS

## 2023-08-11 MED ORDER — LIDOCAINE (PF) 20 MG/ML (2 %) INJECTION SOLUTION
INTRAMUSCULAR | Status: AC
Start: 2023-08-11 — End: 2023-08-11
  Filled 2023-08-11: qty 5

## 2023-08-11 SURGICAL SUPPLY — 1 items: DETERGENT INSTR 22OZ TRNSPT GEL RINSE FREE NEUT PH PREKLENZ CLR PLSNT LF (MISCELLANEOUS PT CARE ITEMS) ×1 IMPLANT

## 2023-08-11 NOTE — Anesthesia Transfer of Care (Signed)
ANESTHESIA TRANSFER OF CARE   Caitlyn Gomez is a 48 y.o. ,female, Weight: (!) 140 kg (308 lb)   had Procedure(s):  COLONOSCOPY  performed  08/11/23   Primary Service: Fidela Juneau, MD    Past Medical History:   Diagnosis Date   . A-fib (CMS HCC)     Dr. Kathie Rhodes. [facility]   . Abnormal Pap smear    . Anxiety    . Arthropathy    . Asthma    . Blastoma (CMS HCC)     ears   . Brain tumor (benign) (CMS HCC)    . Cervical myelopathy (CMS HCC)    . Chiari I malformation (CMS HCC) 2021   . Chronic obstructive airway disease (CMS HCC)    . Complex regional pain syndrome I    . Coronary artery disease    . Depression    . Depression 10/05/2021   . Dysplasia of cervix    . Dysrhythmias     a fib   . Ectopic pregnancy    . Fibromyalgia    . GERD (gastroesophageal reflux disease)     controlled with med   . History of anesthesia complications     aspirated for scope last time   . History of cervical cancer    . HTN (hypertension)    . Hyperlipidemia    . NAFLD (nonalcoholic fatty liver disease)    . Neck mass    . PCOS (polycystic ovarian syndrome)    . Radiculopathy    . Smoking addiction    . Stroke (CMS [facility])     TIA- [place] - 2016 - (left facial droop and left arm weakness)   . Thyroid disorder     nodule   . Thyroid nodule    . Varicosities       Allergy History as of 08/11/23       SULFA (SULFONAMIDES)         Noted Status Severity Type Reaction    03/16/23 2108 Rennis Golden, RN 01/24/10 Active High  Rash, Hives/ Urticaria    Comments: Other Reaction(s): Rash-Raised    Rash and swelling     11/04/22 0704 BosticMindi Junker, RN 01/24/10 Active Medium  Rash    01/24/10 0300 Haddix, Carver Fila 01/24/10 Active                 AMOXICILLIN         Noted Status Severity Type Reaction    11/04/22 0704 BosticMindi Junker, RN 01/24/10 Active Medium  Rash    01/24/10 0301 Haddix, Carver Fila 01/24/10 Active                 PENICILLINS         Noted Status Severity Type Reaction    10/14/21 0840 Smalls, Garrison, LPN [birth year] Active High  Hives/  Urticaria, Rash    Comments: Other reaction(s): Rash, Skin Irritation  Rash and swellling                 MORPHINE         Noted Status Severity Type Reaction    11/04/22 1102 Darlyn Read, RN 01/02/15 Deleted       Comments: Other reaction(s): ARM SWELLED UPON INJECTION, Rash     10/14/21 0840 Olena Mater, LPN 46/96/29 Active       Comments: Other reaction(s): ARM SWELLED UPON INJECTION, Rash               HYMENOPTERA ALLERGENIC  EXTRACT         Noted Status Severity Type Reaction    10/14/21 0840 Olena Mater, LPN 32/35/57 Active High      Comments: Other reaction(s): UNKNOWN               BEE VENOM PROTEIN (HONEY BEE)         Noted Status Severity Type Reaction    11/04/22 0706 Tresa Endo, RN 11/04/22 Active High  Anaphylaxis                  I completed my transfer of care / handoff to the receiving personnel during which we discussed:        Post Location: PACU                                                           Last OR Temp: Temperature: 36.8 C (98.3 F)  ABG:  POTASSIUM   Date Value Ref Range Status   04/14/2023 4.0 3.5 - 5.1 mmol/L Final   01/24/2010 3.3 (L) 3.5 - 5.1 mmol/L Final     KETONES   Date Value Ref Range Status   03/21/2023 Negative Negative, Trace mg/dL Final     CALCIUM   Date Value Ref Range Status   04/14/2023 8.6 8.6 - 10.3 mg/dL Final   32/20/2542 8.6 8.5 - 10.4 mg/dL Final     Calculated P Axis   Date Value Ref Range Status   04/21/2023 45 degrees Final     Calculated R Axis   Date Value Ref Range Status   04/21/2023 19 degrees Final     Calculated T Axis   Date Value Ref Range Status   04/21/2023 16 degrees Final     Airway:* No LDAs found *  Blood pressure 125/73, pulse 78, temperature 36.8 C (98.3 F), resp. rate 18, height 1.676 m (5\' 6" ), weight (!) 140 kg (308 lb), last menstrual period 09/07/2009, SpO2 100%.

## 2023-08-11 NOTE — Anesthesia Postprocedure Evaluation (Signed)
Anesthesia Post Op Evaluation    Patient: Caitlyn Gomez  Procedure(s):  COLONOSCOPY    Last Vitals:Temperature: 36.5 C (97.7 F) (08/11/23 1024)  Heart Rate: 73 (08/11/23 1024)  BP (Non-Invasive): 125/84 (08/11/23 1024)  Respiratory Rate: 16 (08/11/23 1024)  SpO2: 97 % (08/11/23 1024)    No notable events documented.    Patient is sufficiently recovered from the effects of anesthesia to participate in the evaluation and has returned to their pre-procedure level.  Patient location during evaluation: PACU       Patient participation: complete - patient participated  Level of consciousness: awake and alert and responsive to verbal stimuli    Pain management: adequate  Airway patency: patent    Anesthetic complications: no  Cardiovascular status: acceptable  Respiratory status: acceptable  Hydration status: acceptable  Patient post-procedure temperature: Pt Normothermic   PONV Status: Absent

## 2023-08-11 NOTE — OR Surgeon (Signed)
[facility]      Patient Name: Caitlyn Gomez, Caitlyn Gomez [facility] Number: [id number]  Date of Service: 08/11/2023   Date of Birth: [birth year]      Pre-Operative Diagnosis: Diverticulitis     Post-Operative Diagnosis: grade 2 hemorrhoids  moderate sigmoid diverticulosis    Procedure(s)/Description:  COLONOSCOPY: 33295 (CPT)     Attending Surgeon: Fidela Juneau, MD     Anesthesia:  CRNA: Sheela Stack, CRNA    Anesthesia Type: .General       The patient indicates that they have read and understood the preoperative colonoscopy consent form. The benefits, risks and alternatives to the procedure were discussed. I specifically discussed the risk of bleeding and/or perforation requiring operation.The patient indicates they have no further question and wish to proceed. Informed consent was obtained from the patient and/or medical power of attorney.    The patient was brought into the procedure room and placed on the table in the left lateral decubitus position. After IV sedation was given, full finger digital rectal examination was performed with a circumferential sweep of the distal rectal mucosa. Subsequently, the flexible colonoscope was inserted into the rectum and passed without any difficulty. The colonoscope was then advanced up into the sigmoid colon, descending colon, transverse colon, right colon and cecum without any difficulty. Gross examination of each section of the colon was performed. Cecal intubation was achieved and the appendiceal orifice and ileocecal valve were identified. The operative findings of diverticulosis were noted as described above. The colonoscope was withdrawn carefully examining the mucosa as the scope was being extracted with particular attention paid to the proximal sides of folds, flexures, bends and rectal valves. At approximately 10 cm. from the anal verge, the colonoscope was retroflexed to fully examine the distal rectum. The colonoscope was removed and a repeat  digital rectal examination was performed at the completion of the procedure. The patient tolerated the procedure well. No intraoperative complications were encountered.    EKG, pulse, pulse oximetry and blood pressure were monitored throughout the entire procedure.    There were no unplanned events.    The patient was instructed to contact me if they have any problems with their colon such as bleeding, pain or changes in bowel habits. They understood and agreed to do so.    The patient will not need another screening colonoscopy for 10 years which is according to ASGE guidelines. However, if in the future the patient has any problems with abdominal pain, changes in bowel habits, blood in stool, etc., then they should contact me because they may be a candidate for diagnostic colonoscopy before the 10 year time limit.    I discussed with the patient the findings and suggested that the diverticular disease did not appear severe enough to suggest or recommend surgery.  We gave the patient instructions on routine high-fiber diet and diverticular disease precautions.  I would like to see the patient back in clinic in 3 months has a follow up.      Jozelynn Danielson B. Nychelle Cassata, MD, MBA, FACS  [facility] -General Surgery

## 2023-08-11 NOTE — H&P (Signed)
[facility]  General Surgery  History and Physical    Date of Service:  08/11/2023  Caitlyn Gomez, Caitlyn Gomez, 48 y.o. female  Date of Admission:  08/11/2023  Date of Birth:  [birth year]  PCP: Dolores Frame, FNP    Reason for admission:  Colonoscopy    HPI:  Caitlyn Gomez is a 48 y.o. White female who is admitted for Diverticulitis     Caitlyn Gomez presents today for follow up visit after being hospitalized for diverticulitis with a small less than 5 cm abscess which did not require CT-guided drainage.  The patient presents today with mild occasional discomfort in the left lower quadrant but no nausea or vomiting.  No worsening of her symptoms.  The patient denies any fever or chills.  She denies any blood in her stool.     The patient has Chiari malformation and it was scheduled for MRI of the head on October 5.     The patient has lost weight from 368 lb to 299 lb over the past 4-6 months.  The patient takes Lipomeno and Lipostat.     Review of the result(s) of each unique test:  Patient underwent diagnostic testing ( none) prior to this dates visit.  I have personally reviewed the results and that serves as a component of the medical decision making for this encounter        Review of prior external note(s) from each unique source:  Patients referral to this office including a recent assessment by the referring provider.  This was reviewed by me for this unique office visit for the indication and intent of the referral as well as any pertinent medical or surgical history relevant to the patients independent evaluation by me today.    Past Medical History:   Diagnosis Date    A-fib (CMS HCC)     Dr. Kathie Rhodes. [place]    Abnormal Pap smear     Anxiety     Arthropathy     Asthma     Blastoma (CMS HCC)     ears    Brain tumor (benign) (CMS HCC)     Cervical myelopathy (CMS HCC)     Chiari I malformation (CMS HCC) 2021    Chronic obstructive airway disease (CMS HCC)     Complex regional pain syndrome I      Coronary artery disease     Depression     Depression 10/05/2021    Dysplasia of cervix     Dysrhythmias     a fib    Ectopic pregnancy     Fibromyalgia     GERD (gastroesophageal reflux disease)     controlled with med    History of anesthesia complications     aspirated for scope last time    History of cervical cancer     HTN (hypertension)     Hyperlipidemia     NAFLD (nonalcoholic fatty liver disease)     Neck mass     PCOS (polycystic ovarian syndrome)     Radiculopathy     Smoking addiction     Stroke (CMS [facility])     TIA- [place] - 2016 - (left facial droop and left arm weakness)    Thyroid disorder     nodule    Thyroid nodule     Varicosities       Past Surgical History:   Procedure Laterality Date    BLADDER SURGERY  2023    mesh placed  CYSTOSCOPY  11/04/2022    cystoscopy, retropubic mid urethral sling    HX APPENDECTOMY      HX DILATION AND CURETTAGE      HX ENDOMETRIAL ABLATION      HX HEART CATHETERIZATION      stent    HX HYSTERECTOMY      HX TONSILLECTOMY      HX TUBAL LIGATION        Social History     Tobacco Use    Smoking status: Every Day     Current packs/day: 1.00     Average packs/day: 1 pack/day for 28.1 years (28.1 ttl pk-yrs)     Types: Cigarettes     Start date: 1997    Smokeless tobacco: Never   Vaping Use    Vaping status: Never Used   Substance Use Topics    Alcohol use: Not Currently     Alcohol/week: 0.8 standard drinks of alcohol     Types: 1 Glasses of wine per week     Comment: 2-3 times a month;    Drug use: Never       Family Medical History:       Problem Relation (Age of Onset)    Colon Cancer Maternal Grandfather    Colon Polyps Maternal Grandmother    Diabetes Maternal Grandmother, Paternal Aunt    Healthy Mother    Heart Disease Father    Hypertension (High Blood Pressure) Father    Inflammatory Bowel Dz Maternal cousin    Stomach Cancer Maternal Aunt    Sudden Death no cause Other           Medications Prior to Admission       Prescriptions    ANORO ELLIPTA 62.5-25  mcg/actuation Inhalation oral diskus inhaler    Take 1 Puff by inhalation Once a day    apixaban (ELIQUIS) 5 mg Oral Tablet    Take 1 Tablet (5 mg total) by mouth Twice daily    atorvastatin (LIPITOR) 40 mg Oral Tablet    Take 1 Tablet (40 mg total) by mouth Every evening    ipratropium-albuterol 0.5 mg-3 mg(2.5 mg base)/3 mL Solution for Nebulization    Take 3 mL by nebulization Four times a day for 7 days    Patient taking differently:  Take 3 mL by nebulization Four times a day as needed    metoprolol succinate (TOPROL-XL) 50 mg Oral Tablet Sustained Release 24 hr    Take 1 Tablet (50 mg total) by mouth Once a day    nitroGLYCERIN (NITROSTAT) 0.4 mg Sublingual Tablet, Sublingual    Place 1 Tablet (0.4 mg total) under the tongue Every 5 minutes as needed for Chest pain for 3 doses over 15 minutes    omeprazole (PRILOSEC) 40 mg Oral Capsule, Delayed Release(E.C.)    Take 1 Capsule (40 mg total) by mouth Twice daily    ondansetron (ZOFRAN ODT) 4 mg Oral Tablet, Rapid Dissolve    Take 1 Tablet (4 mg total) by mouth Every 8 hours as needed for Nausea/Vomiting    PROAIR HFA 90 mcg/actuation Inhalation oral inhaler    Take 1 Puff by inhalation Every 4 hours as needed           Allergies   Allergen Reactions    Bee Venom Protein (Honey Bee) Anaphylaxis    Hymenoptera Allergenic Extract      Other reaction(s): UNKNOWN    Penicillins Hives/ Urticaria and Rash     Other reaction(s): Rash,  Skin Irritation  Rash and swellling      Sulfa (Sulfonamides) Rash and Hives/ Urticaria     Other Reaction(s): Rash-Raised    Rash and swelling    Amoxicillin Rash          Patient Vitals for the past 24 hrs:   BP Temp Pulse Resp SpO2 Height Weight   08/11/23 1056 110/72 -- 68 16 97 % -- --   08/11/23 1040 119/80 -- 67 16 97 % -- --   08/11/23 1024 125/84 36.5 C (97.7 F) 73 16 97 % -- --   08/11/23 0917 125/73 36.8 C (98.3 F) 78 18 100 % 1.676 m (5\' 6" ) (!) 140 kg (308 lb)          General: appropriate for age. in no acute  distress.    Vital signs are present above and have been reviewed by me     HEENT: Atraumatic, Normocephalic. PERRLA, EOMI. Nose clear. Throat clear.    Lungs: Nonlabored breathing with symmetric expansion.  Clear to auscultation bilaterally    Heart:Regular wth respect to rate and rythmn.    Abdomen:Soft.  Slightly tender to deep palpation left lower quadrant but no rebound guarding or peritoneal signs. Nondistended and otherwise benign     Extremities:  Grossly normal with good range of motion and no major deformities.    Neuro:  Grossly normal motor and sensory function. CN's II through XII intact.    Psychiatric: Alert and oriented to person, place, and time. affect appropriate    Laboratory Data:     No results found for any visits on 08/11/23 (from the past 24 hour(s)).    Imaging Studies:    No orders to display        Assessment/Plan:  Diverticulitis    Colonoscopy scheduled for Wednesday August 11, 2023     Discussed indications, risks, and benefits of colonoscopy with possible biopsy/polypectomy with the patient.  Discussed the possibility of polypectomy, biopsies, and possible repeat examinations.  Risks include bleeding, sedation risks, possibility of missed diagnosis of polyp or malignancy, and remote possibilities of perforation and death.  All questions were answered, and informed consent was clearly obtained.    This note was partially created using voice recognition software and is inherently subject to errors including those of syntax and "sound alike " substitutions which may escape proof reading. In such instances, original meaning may be extrapolated by contextual derivation.    Fidela Juneau, MD, MBA, FACS

## 2023-08-11 NOTE — Discharge Instructions (Addendum)
SURGICAL DISCHARGE INSTRUCTIONS     Dr. Donnal Debar, Gene B, MD  performed your COLONOSCOPY today at the [facility]    [place]  [facility]:  Monday through Friday from 8 a.m. - 4 p.m.: [phone]    For T&D: [phone]  Between 4 p.m. - 8 a.m., weekends and holidays:  Call ER [phone]    PLEASE SEE WRITTEN HANDOUTS AS DISCUSSED BY YOUR NURSE:  diverticulosis, high fiber  diet      ANESTHESIA INFORMATION   ANESTHESIA -- ADULT PATIENTS:  You have received intravenous sedation / general anesthesia, and you may feel drowsy and light-headed for several hours. You may even experience some forgetfulness of the procedure. DO NOT DRIVE A MOTOR VEHICLE or perform any activity requiring complete alertness or coordination until you feel fully awake in about 24-48 hours. Do not drink alcoholic beverages for at least 24 hours. Do not stay alone, you must have a responsible adult available to be with you. You may also experience a dry mouth or nausea for 24 hours. This is a normal side effect and will disappear as the effects of the medication wear off.    REMEMBER   If you experience any difficulty breathing, chest pain, bleeding that you feel is excessive, persistent nausea or vomiting or for any other concerns:  Call your physician Dr.  Donnal Debar, Cherylann Parr, MD   at [phone] . You may also ask to have the general doctor on call paged. They are available to you 24 hours a day.      SPECIAL INSTRUCTIONS / COMMENTS   Findings today:  grade 2 hemorrhoids  moderate sigmoid diverticulosis       FOLLOW-UP APPOINTMENTS   Follow up with Dr Baldomero Lamy April 22 @ 10:30.  Dr Stephanie Acre  [phone]

## 2023-08-11 NOTE — Anesthesia Preprocedure Evaluation (Signed)
ANESTHESIA PRE-OP EVALUATION  Planned Procedure: COLONOSCOPY  Review of Systems         patient summary reviewed          Pulmonary   COPD, mild, asthma, rescue inhaler (denies need for daily inhaler, only PRN), current smoker and Denies smoking in last 24  hours,   Cardiovascular    Hypertension, well controlled, CAD, dysrhythmias, atrial fibrillation and hyperlipidemia ,No peripheral edema,        GI/Hepatic/Renal    GERD, well controlled and liver disease        Endo/Other         Neuro/Psych/MS    CVA (no residual symptoms), fibromyalgia, anxiety, depression     Cancer                  Physical Assessment      Airway       Mallampati: III    TM distance: <3 FB    Neck ROM: full  Mouth Opening: good.  No Facial hair  No Beard        Dental           (+) edentulous           Pulmonary    Breath sounds clear to auscultation  (-) no rhonchi, no decreased breath sounds, no wheezes, no rales and no stridor     Cardiovascular    Rhythm: regular  Rate: Normal  (-) no friction rub, carotid bruit is not present, no peripheral edema and no murmur     Other findings          Plan  ASA 3     Planned anesthesia type: general     total intravenous anesthesia                    Intravenous induction       Anesthetic plan and risks discussed with patient  signed consent obtained          Patient's NPO status is appropriate for Anesthesia.

## 2023-08-12 ENCOUNTER — Telehealth (INDEPENDENT_AMBULATORY_CARE_PROVIDER_SITE_OTHER): Payer: Self-pay | Admitting: Surgery

## 2023-09-08 ENCOUNTER — Emergency Department (HOSPITAL_COMMUNITY): Payer: Medicaid Other

## 2023-09-08 ENCOUNTER — Emergency Department
Admission: EM | Admit: 2023-09-08 | Discharge: 2023-09-08 | Disposition: A | Discharge status: Home / Self Care | Payer: Medicaid Other | Attending: Family | Admitting: Family

## 2023-09-08 ENCOUNTER — Other Ambulatory Visit: Payer: Self-pay

## 2023-09-08 DIAGNOSIS — U071 COVID-19: Secondary | ICD-10-CM | POA: Insufficient documentation

## 2023-09-08 DIAGNOSIS — J449 Chronic obstructive pulmonary disease, unspecified: Secondary | ICD-10-CM | POA: Insufficient documentation

## 2023-09-08 DIAGNOSIS — J3489 Other specified disorders of nose and nasal sinuses: Secondary | ICD-10-CM | POA: Insufficient documentation

## 2023-09-08 DIAGNOSIS — R0981 Nasal congestion: Secondary | ICD-10-CM | POA: Insufficient documentation

## 2023-09-08 DIAGNOSIS — R062 Wheezing: Secondary | ICD-10-CM | POA: Insufficient documentation

## 2023-09-08 DIAGNOSIS — R059 Cough, unspecified: Secondary | ICD-10-CM | POA: Insufficient documentation

## 2023-09-08 LAB — COVID-19, FLU A/B, RSV RAPID BY PCR
INFLUENZA VIRUS TYPE A: NOT DETECTED
INFLUENZA VIRUS TYPE B: NOT DETECTED
RESPIRATORY SYNCTIAL VIRUS (RSV): NOT DETECTED
SARS-CoV-2: DETECTED — AB

## 2023-09-08 MED ORDER — ALBUTEROL SULFATE HFA 90 MCG/ACTUATION AEROSOL INHALER
1.0000 | INHALATION_SPRAY | Freq: Four times a day (QID) | RESPIRATORY_TRACT | 0 refills | Status: AC | PRN
Start: 2023-09-08 — End: 2023-09-18

## 2023-09-08 MED ORDER — PREDNISONE 50 MG TABLET
50.0000 mg | ORAL_TABLET | Freq: Every day | ORAL | 0 refills | Status: AC
Start: 2023-09-08 — End: 2023-09-13

## 2023-09-08 NOTE — ED Triage Notes (Signed)
Tested positive last week with covid . States cough much worse . Lost taste and smell . Denies sob

## 2023-09-08 NOTE — ED Provider Notes (Signed)
[facility] - Emergency Department  ED Primary Provider Note  History of Present Illness   Chief Complaint   Patient presents with    Cough     Lilliona Blakeney Bares is a 48 y.o. female who had concerns including Cough.  Arrival: The patient arrived by Car    Patient is a 48 year old female to the emergency department complaining of cough, rhinorrhea, sinus congestion.  Patient tested positive for COVID last week and states symptoms have maintained.  Patient states she tested again and tested negative today however her boyfriend does have COVID.  Patient has history of chronic obstructive pulmonary disease and states she has mild amount of wheezing but denies shortness breath or chest pain.  Patient denies hemoptysis.  Patient states she has been taking decongestion at home with mild relief.      History Reviewed This Encounter:     Physical Exam   ED Triage Vitals [09/08/23 0748]   BP (Non-Invasive) (!) 142/89   Heart Rate 77   Respiratory Rate 20   Temperature 36.6 C (97.9 F)   SpO2 100 %   Weight (!) 137 kg (301 lb)   Height 1.707 m (5' 7.2")     Physical Exam  Vitals and nursing note reviewed.   Constitutional:       General: She is not in acute distress.     Appearance: She is well-developed.   HENT:      Head: Normocephalic and atraumatic.   Eyes:      Conjunctiva/sclera: Conjunctivae normal.   Cardiovascular:      Rate and Rhythm: Normal rate and regular rhythm.      Heart sounds: No murmur heard.  Pulmonary:      Effort: Pulmonary effort is normal. No respiratory distress.      Breath sounds: Normal breath sounds.   Abdominal:      Palpations: Abdomen is soft.      Tenderness: There is no abdominal tenderness.   Musculoskeletal:         General: No swelling.      Cervical back: Neck supple.   Skin:     General: Skin is warm and dry.      Capillary Refill: Capillary refill takes less than 2 seconds.   Neurological:      Mental Status: She is alert.   Psychiatric:         Mood and Affect:  Mood normal.       Patient Data     Labs Ordered/Reviewed   COVID-19, FLU A/B, RSV RAPID BY PCR - Abnormal; Notable for the following components:       Result Value    SARS-CoV-2 Detected (*)     All other components within normal limits    Narrative:     Results are for the simultaneous qualitative identification of SARS-CoV-2 (formerly 2019-nCoV), Influenza A, Influenza B, and RSV RNA. These etiologic agents are generally detectable in nasopharyngeal and nasal swabs during the ACUTE PHASE of infection. Hence, this test is intended to be performed on respiratory specimens collected from individuals with signs and symptoms of upper respiratory tract infection who meet Centers for Disease Control and Prevention (CDC) clinical and/or epidemiological criteria for Coronavirus Disease 2019 (COVID-19) testing. CDC COVID-19 criteria for testing on human specimens is available at [facility] webpage information for Healthcare Professionals: Coronavirus Disease 2019 (COVID-19) ([employer]).     False-negative results may occur if the virus has genomic mutations, insertions, deletions, or rearrangements or if  performed very early in the course of illness. Otherwise, negative results indicate virus specific RNA targets are not detected, however negative results do not preclude SARS-CoV-2 infection/COVID-19, Influenza, or Respiratory syncytial virus infection. Results should not be used as the sole basis for patient management decisions. Negative results must be combined with clinical observations, patient history, and epidemiological information. If upper respiratory tract infection is still suspected based on exposure history together with other clinical findings, re-testing should be considered.    Test methodology:   Cepheid Xpert Xpress SARS-CoV-2/Flu/RSV Assay real-time polymerase chain reaction (RT-PCR) test on the GeneXpert Dx and Xpert Xpress systems.     XR CHEST AP AND LATERAL    Final Result by Edi, Radresults In (02/19 0835)   NO ACUTE FINDINGS.         Radiologist location ID: [id number]           Medical Decision Making        Medical Decision Making  Patient is a 48 year old female to the emergency department complaining of cough, rhinorrhea, sinus congestion.  Patient tested positive for COVID last week and states symptoms have maintained.  Patient states she tested again and tested negative today however her boyfriend does have COVID.  Patient has history of chronic obstructive pulmonary disease and states she has mild amount of wheezing but denies shortness breath or chest pain.  Patient denies hemoptysis.  Patient states she has been taking decongestion at home with mild relief.    On physical examination lung fields are clear bilaterally.  Patient is afebrile.  Vital signs are stable.  COVID flu RSV shows patient positive for COVID.  Chest x-ray shows no pneumonia.  This has with patient treatment at home for COVID symptomatically.  Patient additionally given albuterol in prednisone at home due to past medical history of chronic obstructive pulmonary disease.  Patient discharged follow up with primary care provider and return to the emergency department if worsening symptoms.    Amount and/or Complexity of Data Reviewed  Radiology: ordered.    Risk  Prescription drug management.                   Clinical Impression   COVID (Primary)       Disposition: Discharged

## 2023-09-28 ENCOUNTER — Encounter (INDEPENDENT_AMBULATORY_CARE_PROVIDER_SITE_OTHER): Payer: Self-pay | Admitting: OTOLARYNGOLOGY

## 2023-09-30 ENCOUNTER — Encounter (INDEPENDENT_AMBULATORY_CARE_PROVIDER_SITE_OTHER): Payer: Self-pay | Admitting: Otolaryngology

## 2023-09-30 ENCOUNTER — Ambulatory Visit (HOSPITAL_BASED_OUTPATIENT_CLINIC_OR_DEPARTMENT_OTHER): Admitting: Audiologist

## 2023-09-30 ENCOUNTER — Ambulatory Visit: Attending: Otolaryngology | Admitting: Otolaryngology

## 2023-09-30 ENCOUNTER — Other Ambulatory Visit: Payer: Self-pay

## 2023-09-30 VITALS — BP 140/82 | HR 78 | Temp 97.9°F | Ht 66.0 in | Wt 308.2 lb

## 2023-09-30 DIAGNOSIS — D333 Benign neoplasm of cranial nerves: Secondary | ICD-10-CM

## 2023-09-30 DIAGNOSIS — H903 Sensorineural hearing loss, bilateral: Secondary | ICD-10-CM | POA: Insufficient documentation

## 2023-09-30 NOTE — Progress Notes (Signed)
 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 She does note a decline in AD hearing. She does have dizziness with previous falls. [name] has constant tinnitus, AD and intermittent tinnitus, AS. She is starting to notice a decline in AS hearing. [name] also reports Chiari malformation.     Impressions: Otoscopy clear with visible TMs, AU. Type A tymp with normal TM mobility, pressure, and ECV, AS. Type C tymp with normal TM mobility, significant negative pressure, and normal ECV, AD. Please note abnormally wide gradient, AD. 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 Profound SNHL, AD.    Recommendations: Continue ENT F/U; audio in conjunction with medical management. [name] is a HA candidate, AS, but her insurance will not cover. [employer] information was given to her to pursue HA AS.     [name], AuD, CCC-A  Clinical Audiologist

## 2023-09-30 NOTE — Progress Notes (Signed)
 NAME:  Caitlyn Gomez  MRN:  [id number]  DOB:  [birth year]  DOS:  09/30/2023    Subjective  Caitlyn Gomez is a 48 y.o. year old female with history of significant right hearing loss and vestibular schwannoma. The patient was last seen on 12/02/2021 by Edwin Dada APRN. Today, the patient reports not doing well. She notes having dizzy spells light headedness. She says she almost passed out a couple of times. She notes 6 dizzy spells the other day. Her dizzy spells last about 10-15 seconds. She provides her balance seems off and her body is tending to lean. She has had major falls since last visit and had to have a portion of her left side reconstructed. She states she was recently diagnosed with cervical myelopathy and complex regional pain syndrome. She endorses history of headaches. She was on physical therapy but reports it does not help. She reports severe tinnitus on the right. Tinnitus on the left ear comes and goes.         Allergies   Allergen Reactions    Bee Venom Protein (Honey Bee) Anaphylaxis    Hymenoptera Allergenic Extract      Other reaction(s): UNKNOWN    Penicillins Hives/ Urticaria and Rash     Other reaction(s): Rash, Skin Irritation  Rash and swellling      Sulfa (Sulfonamides) Rash and Hives/ Urticaria     Other Reaction(s): Rash-Raised    Rash and swelling    Amoxicillin Rash     Current Outpatient Medications   Medication Sig    ANORO ELLIPTA 62.5-25 mcg/actuation Inhalation oral diskus inhaler Take 1 Puff by inhalation Once a day    apixaban (ELIQUIS) 5 mg Oral Tablet Take 1 Tablet (5 mg total) by mouth Twice daily    atorvastatin (LIPITOR) 40 mg Oral Tablet Take 1 Tablet (40 mg total) by mouth Every evening    ipratropium-albuterol 0.5 mg-3 mg(2.5 mg base)/3 mL Solution for Nebulization Take 3 mL by nebulization Four times a day for 7 days (Patient taking differently: Take 3 mL by nebulization Four times a day as needed)    metoprolol succinate (TOPROL-XL) 50 mg Oral Tablet  Sustained Release 24 hr Take 1 Tablet (50 mg total) by mouth Once a day    nitroGLYCERIN (NITROSTAT) 0.4 mg Sublingual Tablet, Sublingual Place 1 Tablet (0.4 mg total) under the tongue Every 5 minutes as needed for Chest pain for 3 doses over 15 minutes    omeprazole (PRILOSEC) 40 mg Oral Capsule, Delayed Release(E.C.) Take 1 Capsule (40 mg total) by mouth Twice daily    ondansetron (ZOFRAN ODT) 4 mg Oral Tablet, Rapid Dissolve Take 1 Tablet (4 mg total) by mouth Every 8 hours as needed for Nausea/Vomiting    PROAIR HFA 90 mcg/actuation Inhalation oral inhaler Take 1 Puff by inhalation Every 4 hours as needed       Objective  Blood pressure (!) 140/82, pulse 78, temperature 36.6 C (97.9 F), temperature source Temporal, height 1.676 m (5\' 6" ), weight (!) 140 kg (308 lb 3.3 oz), last menstrual period 09/07/2009.  Well appearing and in no apparent distress    Ears:   Right:    Pinnae: Normal shape and position.   External auditory canals:  Patent without inflammation.  Tympanic membranes:  Intact, translucent, midposition, middle ear aerated.    Left:   Pinnae: Normal shape and position.   External auditory canals:  Patent without inflammation. Minimal wax removed.  Tympanic membranes:  Intact, translucent, midposition,  middle ear aerated.    Binocular microscopy necessary on exam today to visualize the ears for diagnostic and/or treatment purposes; see dictated note.  Calvin Seol 09/30/2023, 13:19    Data Reviewed:    MRI Brain on 04/24/2023: Right-sided vestibular schwannoma extended from fundus into the CPA. Measuring about 18 x 10 x  11 mm. Small compression of cerebellum. Central lucency present in the CPA component. Possible enlargement compared to prior imaging.     Audiogram was performed 09/30/2023. SRT on the right was NR dB, and SRT on the left was 40 dB. Word recognition on the right was NA, and word recognition on the left was 88% at 80 dB. Tympanograms were type C on the right with 1.44 ECV and type A on  the left with 1.75 ECV.       Assessment:  Right VS, slight increase in size compared to previous imaging  Right episodic dizziness, right vestibular weakness        Plan:  Discussed surgery and hearing aids  Follow-up with Dr. Melvyn Neth  Move up scheduled MRI IAC to October  RTC in October with repeat imaging    I am scribing for, and in the presence of, Dr. Pearlie Oyster for services provided on 09/30/2023.  [employer], SCRIBE  [employer], SCRIBE 09/30/2023 13:28    I personally performed the services described in this documentation, as scribed  in my presence, and it is both accurate  and complete.    Danella Penton, MD  [place] [facility] of Otol[facility]ology    PCP: Dolores Frame, FNP  [address]  [place] [address]  REF: Dolores Frame, FNP  [address]  [place],  [place] [address]

## 2023-10-01 ENCOUNTER — Emergency Department
Admission: EM | Admit: 2023-10-01 | Discharge: 2023-10-02 | Disposition: A | Discharge status: Home / Self Care | Source: Home / Self Care | Attending: Emergency Medicine | Admitting: Emergency Medicine

## 2023-10-01 ENCOUNTER — Other Ambulatory Visit: Payer: Self-pay

## 2023-10-01 ENCOUNTER — Emergency Department (HOSPITAL_COMMUNITY)

## 2023-10-01 DIAGNOSIS — L0390 Cellulitis, unspecified: Secondary | ICD-10-CM

## 2023-10-01 DIAGNOSIS — R918 Other nonspecific abnormal finding of lung field: Secondary | ICD-10-CM | POA: Insufficient documentation

## 2023-10-01 DIAGNOSIS — L03311 Cellulitis of abdominal wall: Secondary | ICD-10-CM | POA: Insufficient documentation

## 2023-10-01 LAB — URINALYSIS, MICROSCOPIC
RBCS: 2 /HPF (ref <4)
SQUAMOUS EPITHELIAL: 3 /HPF (ref <28)
WBCS: 1 /HPF (ref <6)

## 2023-10-01 LAB — CBC WITH DIFF
BASOPHIL #: 0.1 10*3/uL (ref 0.00–0.10)
BASOPHIL %: 1 % (ref 0–1)
EOSINOPHIL #: 0.2 10*3/uL (ref 0.00–0.50)
EOSINOPHIL %: 2 % (ref 1–7)
HCT: 40.6 % (ref 31.2–41.9)
HGB: 13.2 g/dL (ref 10.9–14.3)
LYMPHOCYTE #: 2.5 10*3/uL (ref 1.10–3.10)
LYMPHOCYTE %: 26 % (ref 16–46)
MCH: 28.1 pg (ref 24.7–32.8)
MCHC: 32.5 g/dL (ref 32.3–35.6)
MCV: 86.7 fL (ref 75.5–95.3)
MONOCYTE #: 0.7 10*3/uL (ref 0.20–0.90)
MONOCYTE %: 8 % (ref 4–11)
MPV: 8.9 fL (ref 7.9–10.8)
NEUTROPHIL #: 5.9 10*3/uL (ref 1.90–8.20)
NEUTROPHIL %: 63 % (ref 43–77)
PLATELETS: 272 10*3/uL (ref 140–440)
RBC: 4.68 10*6/uL (ref 3.63–4.92)
RDW: 14.1 % (ref 12.3–17.7)
WBC: 9.3 10*3/uL (ref 3.8–11.8)

## 2023-10-01 LAB — HEPATIC FUNCTION PANEL
ALBUMIN/GLOBULIN RATIO: 1.3 (ref 0.8–1.4)
ALBUMIN: 4.1 g/dL (ref 3.5–5.7)
ALKALINE PHOSPHATASE: 70 U/L (ref 34–104)
ALT (SGPT): 15 U/L (ref 7–52)
AST (SGOT): 16 U/L (ref 13–39)
BILIRUBIN DIRECT: 0.07 md/dL (ref 0.03–0.18)
BILIRUBIN TOTAL: 0.3 mg/dL (ref 0.3–1.0)
BILIRUBIN, INDIRECT: 0.23 mg/dL (ref <=1)
GLOBULIN: 3.2 (ref 2.0–3.5)
PROTEIN TOTAL: 7.3 g/dL (ref 6.4–8.9)

## 2023-10-01 LAB — URINALYSIS, MACROSCOPIC
BILIRUBIN: NEGATIVE mg/dL
BLOOD: NEGATIVE mg/dL
GLUCOSE: NEGATIVE mg/dL
KETONES: NEGATIVE mg/dL
LEUKOCYTES: 25 WBCs/uL — AB
PH: 7 (ref 5.0–9.0)
PROTEIN: NEGATIVE mg/dL
SPECIFIC GRAVITY: 1.024 (ref 1.002–1.030)
UROBILINOGEN: NORMAL mg/dL

## 2023-10-01 LAB — BASIC METABOLIC PANEL
ANION GAP: 10 mmol/L (ref 4–13)
BUN/CREA RATIO: 16 (ref 6–22)
BUN: 13 mg/dL (ref 7–25)
CALCIUM: 8.9 mg/dL (ref 8.6–10.3)
CHLORIDE: 103 mmol/L (ref 98–107)
CO2 TOTAL: 26 mmol/L (ref 21–31)
CREATININE: 0.79 mg/dL (ref 0.60–1.30)
ESTIMATED GFR: 94 mL/min/{1.73_m2} (ref >59)
GLUCOSE: 92 mg/dL (ref 74–109)
OSMOLALITY, CALCULATED: 277 mosm/kg (ref 270–290)
POTASSIUM: 4.1 mmol/L (ref 3.5–5.1)
SODIUM: 139 mmol/L (ref 136–145)

## 2023-10-01 LAB — TROPONIN-I: TROPONIN I: 3 ng/L (ref <15)

## 2023-10-01 LAB — LACTIC ACID LEVEL W/ REFLEX FOR LEVEL >2.0: LACTIC ACID: 1.6 mmol/L (ref 0.5–2.2)

## 2023-10-01 MED ORDER — SODIUM CHLORIDE 0.9 % (FLUSH) INJECTION SYRINGE
3.0000 mL | INJECTION | Freq: Three times a day (TID) | INTRAMUSCULAR | Status: DC
Start: 2023-10-01 — End: 2023-10-02
  Administered 2023-10-01: 3 mL

## 2023-10-01 MED ORDER — SODIUM CHLORIDE 0.9 % (FLUSH) INJECTION SYRINGE
3.0000 mL | INJECTION | INTRAMUSCULAR | Status: DC | PRN
Start: 2023-10-01 — End: 2023-10-02

## 2023-10-01 MED ORDER — IOHEXOL 350 MG IODINE/ML INTRAVENOUS SOLUTION
100.0000 mL | INTRAVENOUS | Status: AC
Start: 2023-10-01 — End: 2023-10-01
  Administered 2023-10-01: 100 mL via INTRAVENOUS

## 2023-10-01 MED ORDER — CLINDAMYCIN 900 MG/50 ML IN 0.9% SODIUM CHLORIDE INTRAVENOUS PIGGYBACK
INJECTION | INTRAVENOUS | Status: AC
Start: 2023-10-01 — End: 2023-10-01
  Filled 2023-10-01: qty 50

## 2023-10-01 MED ORDER — CLINDAMYCIN 900 MG/50 ML IN 0.9% SODIUM CHLORIDE INTRAVENOUS PIGGYBACK
900.0000 mg | INJECTION | INTRAVENOUS | Status: AC
Start: 2023-10-01 — End: 2023-10-01
  Administered 2023-10-01: 0 mg via INTRAVENOUS
  Administered 2023-10-01: 900 mg via INTRAVENOUS

## 2023-10-01 NOTE — ED APP Handoff Note (Signed)
 [facility] - Emergency Department  Emergency Department  Provider in Triage Note    Name: Caitlyn Gomez  Age: 48 y.o.  Gender: female     Subjective:   Caitlyn Gomez is a 48 y.o. female who presents with complaint of Skin Problem  .  Patient here with c/o abscess to L side of abdomen    Objective:   Filed Vitals:    10/01/23 1914   BP: (!) 120/58   Pulse: (!) 110   Resp: 20   Temp: 36.2 C (97.1 F)   SpO2: 97%        Assessment:  A medical screening exam was completed.  This patient is a 48 y.o. female with initial findings showing abscess     Plan:  Please see initial orders and work-up below.  This is to be continued with full evaluation in the main Emergency Department.     No current facility-administered medications for this encounter.     No results found for this or any previous visit (from the past 24 hours).     Sherlie Ban, FNP, ENP-C  10/01/2023, 19:18

## 2023-10-01 NOTE — ED Provider Notes (Signed)
 [facility]  ED Primary Provider Note  Patient Name: Caitlyn Gomez  Patient Age: 48 y.o.  Date of Birth: 24-Jul-1975    Chief Complaint: Skin Problem        History of Present Illness       Caitlyn Gomez is a 48 y.o. female who had concerns including Skin Problem.  This patient presents as a 48 year old with a left anterior skin complaint.  The patient states the redness and discomfort with drainage has been present for approximately 2 days.  The patient has family member who is an LPN looked at the wound under magnifying glass, and believes she saw bite marks to resemble a brown recluse.  The patient does not have any reported skin necrosis of large territory, and she does not report being around a spider however she initially believes this to be a boil which progressed.  She does not have a diagnosis of diabetes.        Review of Systems     No other overt Review of Systems are noted to be positive except noted in the HPI.    { Be sure to review and modify ROS as appropriate. As of Jul 20, 2021 you are only required to have a "medically appropriate" ROS and PE for billing purposes. This help text will disappear when signing your note.:123}  Historical Data   History Reviewed This Encounter: ***History has not been "Marked as Reviewed" - Go to the top of the ED Triage Tab to document your review. Make sure history sections are updated and accurate - Click Here to jump to History for update.***      Physical Exam   ED Triage Vitals [10/01/23 1914]   BP (Non-Invasive) (!) 120/58   Heart Rate (!) 110   Respiratory Rate 20   Temperature 36.2 C (97.1 F)   SpO2 97 %   Weight (!) 139 kg (306 lb)   Height 1.676 m (5\' 6" )     {Be sure to review and modify Physical Exam as appropriate. As of Jul 20, 2021 you are only required to have a "medically appropriate" ROS and PE for billing purposes. This help text will disappear when signing your note.:123}    Nursing notes reviewed for what  could be assessed. Past Medical, Surgical, and Social history reviewed for what has been completed.    Constitutional: NAD. Well-Developed. Well Nourished.  BMI 49.  Head: Normocephalic, atraumatic.  Mouth/Throat:  Symmetric facial movement.  Eyes: EOM grossly intact, conjunctiva normal.  Neck: Supple  Cardiovascular: Regular Rate and Rhythm, extremities well perfused.  Pulmonary/Chest: No respiratory distress. Lungs are symmetric to auscultation bilaterally.  Abdominal: Soft, non-tender, non-distended. Non peritoneal, no rebound, no guarding.  MSK: No Lower Extremity Edema.  Skin: Warm, dry.  Area of erythema to the left anterior lateral abdomen, no active drainage.  Neuro: Appropriate, CN II-XII grossly intact.  Psych: Pleasant              Procedures      Patient Data   {Click here to open the ED Workup Activity for clinical data review *This link will automatically disappear upon signing your note*:123}Labs Ordered/Reviewed - No data to display    No orders to display       Medical Decision Making     ED clinical impression missing, please click on the following link to add Clinical Impressions ***  then refresh the note prior to signing.  {Be sure to  fill out the MDM SmartBlock in Notewriter to the left. Do not modify this italicized text, it will disappear upon signing your note:123}  MDM        Studies Assessed: ***    EKG:   This independent EKG interpretation by that has noted below, this will be reviewed and documented separately by a cardiologist in the EMR.:    Rate: ***    Interpretation: ***      MDM Narrative:  ***        Differential includes but not limited to:  ***      Follow-Up Discussion: ***    Please see documentation above for specific labs and radiology.    Decision for and Complexity of Risk in the ED encounter:  ***I ordered prescription medications requiring authorization in the ED or at discharge.  ***I decided when to consult for hospitalization or escalation of hospital-level  care.  ***I ordered Parenteral Controlled Substances  Social Determinants Contributing to Healthcare Difficulty: ***  Independent/Additional Historian: *** with information given about ***.  Discussion of care with a provider outside of the Emergency Department: ***          A 11ml/kg fluid bolus not ordered due to ***.  Please see the EMR for the total amount ordered/administered.                   ED Course as of 10/01/23 2206   Fri Oct 01, 2023   2206 WBC: 9.3  Within normal limits               Medications Ordered/Administered in the ED   NS flush syringe (has no administration in time range)   NS flush syringe (has no administration in time range)   NS flush syringe (has no administration in time range)   NS flush syringe (has no administration in time range)       {Disposition:38159}             "Risk" as noted in the medical decision-making is in reference to potential morbidity/mortality of management based upon previously established billing guidelines.    Based upon the clinical setting, the likely diagnosis/impression include:    ED clinical impression missing, please click on the following link to add Clinical Impressions ***  then refresh the note prior to signing.      Current Discharge Medication List                This note was partially created using voice recognition software and is inherently subject to errors including those of syntax and "sound alike " substitutions which may escape proof reading. In such instances, original meaning may be extrapolated by contextual derivation.      /R. Tobey Bride, MD, Lacie Scotts  Department of Emergency Medicine  [facility]                {Remember to refresh your note prior to signing. Use Control + F11 or click the refresh button at the bottom of the note. This reminder text will automatically disappear when you sign your note.:123}

## 2023-10-01 NOTE — ED Triage Notes (Signed)
 Redness, drainage and edema to left anterior abd x 4 days

## 2023-10-02 ENCOUNTER — Encounter (HOSPITAL_COMMUNITY): Payer: Self-pay | Admitting: Emergency Medicine

## 2023-10-02 LAB — GOLD TOP TUBE

## 2023-10-02 LAB — BLUE TOP TUBE

## 2023-10-02 MED ORDER — CLINDAMYCIN HCL 150 MG CAPSULE
450.0000 mg | ORAL_CAPSULE | Freq: Three times a day (TID) | ORAL | 0 refills | Status: AC
Start: 2023-10-02 — End: 2023-10-12

## 2023-10-02 NOTE — ED Nurses Note (Signed)
 Pt IVS removed at this time. Pt given verbal and written discharge instructions, verbalized understanding.

## 2023-10-02 NOTE — Discharge Instructions (Signed)

## 2023-10-04 ENCOUNTER — Ambulatory Visit (HOSPITAL_BASED_OUTPATIENT_CLINIC_OR_DEPARTMENT_OTHER): Payer: Self-pay | Admitting: NURSE PRACTITIONER

## 2023-10-06 LAB — ADULT ROUTINE BLOOD CULTURE, SET OF 2 BOTTLES (BACTERIA AND YEAST)
BLOOD CULTURE, ROUTINE: NO GROWTH
BLOOD CULTURE, ROUTINE: NO GROWTH

## 2023-10-07 LAB — URINE CULTURE,ROUTINE: URINE CULTURE: >100000 — AB

## 2023-10-10 ENCOUNTER — Other Ambulatory Visit: Payer: Self-pay

## 2023-10-10 ENCOUNTER — Emergency Department
Admission: EM | Admit: 2023-10-10 | Discharge: 2023-10-10 | Disposition: A | Discharge status: Home / Self Care | Source: Home / Self Care | Attending: Emergency Medicine | Admitting: Emergency Medicine

## 2023-10-10 ENCOUNTER — Encounter (HOSPITAL_COMMUNITY): Payer: Self-pay

## 2023-10-10 DIAGNOSIS — L0390 Cellulitis, unspecified: Secondary | ICD-10-CM | POA: Insufficient documentation

## 2023-10-10 DIAGNOSIS — L259 Unspecified contact dermatitis, unspecified cause: Secondary | ICD-10-CM | POA: Insufficient documentation

## 2023-10-10 DIAGNOSIS — L03311 Cellulitis of abdominal wall: Secondary | ICD-10-CM

## 2023-10-10 MED ORDER — LORATADINE 10 MG TABLET
10.0000 mg | ORAL_TABLET | ORAL | Status: AC
Start: 2023-10-10 — End: 2023-10-10
  Administered 2023-10-10: 10 mg via ORAL

## 2023-10-10 MED ORDER — DIPHENHYDRAMINE-ZINC ACETATE 2 %-0.1 % TOPICAL CREAM
TOPICAL_CREAM | CUTANEOUS | Status: AC
Start: 2023-10-10 — End: 2023-10-10
  Filled 2023-10-10 (×2): qty 28.4

## 2023-10-10 MED ORDER — DIPHENHYDRAMINE-ZINC ACETATE 2 %-0.1 % TOPICAL CREAM
TOPICAL_CREAM | Freq: Four times a day (QID) | CUTANEOUS | 0 refills | Status: AC | PRN
Start: 2023-10-10 — End: 2023-10-17

## 2023-10-10 MED ORDER — LORATADINE 10 MG TABLET
ORAL_TABLET | ORAL | Status: AC
Start: 2023-10-10 — End: 2023-10-10
  Filled 2023-10-10: qty 1

## 2023-10-10 NOTE — ED Nurses Note (Signed)
 Pt's wound dressed with non-adherent and ABD pad at this time. Pt. D/C out of ED, no further questions or concerns. VSS, NADN.

## 2023-10-10 NOTE — ED Nurses Note (Signed)
 Pt. Arrived via triage with c/o LL abdomen possible spider bite that has worsened over the past week. Pt. Reports noticing the area "busting" last night with brown drainage. Pt. Has open wound to LL abdomen with redness/blisters around area where tape was placed. Pt. Reports chills, denies F/N/V/. Pt. A&OX3, VSS, NADN.

## 2023-10-10 NOTE — ED Provider Notes (Signed)
 [facility]  ED Primary Provider Note  Patient Name: Caitlyn Gomez  Patient Age: 48 y.o.  Date of Birth: [birth year]    Chief Complaint: Skin Problem        History of Present Illness       Caitlyn Gomez is a 48 y.o. female who had concerns including Skin Problem.  This patient presents through triage with a concern of skin infection.  I have seen the patient previously for a superficial cellulitis for which CT imaging was obtained ruling out deeper complication.  The patient followed up with her regular doctor who also prescribed mupirocin to place on top of the infection.  She states that this has began to drain.  She is compliant with her antibiotic.  The patient states there is burning around the lesion which follows the distribution of the bandages she has been using.        Review of Systems     No other overt Review of Systems are noted to be positive except noted in the HPI.      Historical Data   History Reviewed This Encounter:        Physical Exam   ED Triage Vitals [10/10/23 1214]   BP (Non-Invasive) (!) 157/90   Heart Rate 84   Respiratory Rate 20   Temperature 36.3 C (97.3 F)   SpO2 100 %   Weight (!) 141 kg (310 lb)   Height 1.676 m (5\' 6" )         Nursing notes reviewed for what could be assessed. Past Medical, Surgical, and Social history reviewed.     Constitutional: NAD. Well-Developed. Well Nourished.  Head: Normocephalic, atraumatic.  Mouth/Throat:  Symmetric facial movement.  Eyes: EOM grossly intact, conjunctiva normal.  Neck: Supple  Cardiovascular: Extremities well perfused.  Non tachycardic.  Pulmonary/Chest: No respiratory distress.   Abdominal: Non-distended. No overt peritoneal findings.   MSK: No Lower Extremity Edema.  Skin: Warm, dry. The patient has a central area of drainage from the previously diagnosed area of infection.  She does have cream/ointment which was prescribed by her primary provider covering the center which is draining.   Along the periphery, there are raised areas which is the stinging/discomfort the patient is mostly concerned of.  This follows the area of the adhesive bandage almost exactly.  Neuro: Appropriate, CN II-XII grossly intact.   Psych: Pleasant            Procedures          After discussion with the patient, bedside ultrasound was utilized to assess for large fluid collection.    Reason: Infection  Finding: No large abscess.        Patient Data   Labs Ordered/Reviewed - No data to display    No orders to display       Medical Decision Making          Medical Decision Making              MDM Narrative:  This patient is a 48 year old female who I have evaluated previously for an anterior abdominal area of cellulitis.  A CT scan and lab workup was obtained during the previous evaluation with no acute abnormality.  The patient is improving, but did have a reaction to the tape covering the area of concern.  Her primary care provider did prescribe mupirocin.  The patient is mostly worried about the area surrounding her lesion which is causing discomfort.  She was treated with topical antihistamines, which did improve her symptoms.  A bedside ultrasound was obtained showing no large fluid collection.  Patient was prescribed the cream, and strongly advised to not use any type of the adhesive bandages causing her contact dermatitis.  She was agreeable.  Return precautions given.  Notably, the patient does not have any findings of acute necrotizing infection, nor worsening condition.  I further discussed she may need to follow up with the primary care provider later this week, who can extend her oral antibiotics considering she is healing. She may have a decreased time of healing compared to baseline since she continues to smoke. I described how this can decrease her healing of her infection        Differential includes but not limited to:  Cellulitis: Considered in the patient was treated and counseled  Contact dermatitis:  Considered in the patient was counseled in treated  Abscess: Not noted on ultrasound      Follow-Up Discussion:  Patient updated    Please see documentation above for specific labs and radiology.    Decision for and Complexity of Risk in the ED encounter:    Social Determinants Contributing to Healthcare Difficulty:  The patient has smoked                                  Medications Ordered/Administered in the ED   diphenhydrAMINE -zinc  acetate (BENADRYL  EXTRA STRENGTH) 2% topical cream ( Apply Topically Given 10/10/23 1321)   loratadine  (CLARITIN ) tablet (10 mg Oral Given 10/10/23 1245)       Following the history, physical exam, and ED workup, the patient was deemed stable and suitable for discharge. The patient/caregiver was advised to return to the ED for any new or worsening symptoms. Discharge medications, and follow-up instructions were discussed with the patient/caregiver in detail, who verbalizes understanding. The patient/caregiver is in agreement and is comfortable with the plan of care.    Disposition: Discharged         Current Discharge Medication List        START taking these medications.        Details   diphenhydrAMINE  2-0.1 % Cream  Commonly known as: Benadryl  Extra Strength   Apply Topically, 4 TIMES DAILY PRN, Do not apply to open wound, only area of allergic reaction.  Qty: 15 g  Refills: 0            CONTINUE these medications - NO CHANGES were made during your visit.        Details   * ProAir  HFA 90 mcg/actuation oral inhaler  Generic drug: albuterol  sulfate   1 Puff, Inhalation, EVERY 4 HOURS PRN  Qty: 6.7 g  Refills: 3     Anoro Ellipta  62.5-25 mcg/actuation oral diskus inhaler  Generic drug: umeclidinium-vilanteroL   1 Puff, Daily  Refills: 0     apixaban  5 mg Tablet  Commonly known as: ELIQUIS    5 mg, 2 TIMES DAILY  Refills: 0     atorvastatin  40 mg Tablet  Commonly known as: LIPITOR   40 mg, EVERY EVENING  Refills: 0     clindamycin  150 mg Capsule  Commonly known as: CLEOCIN    450 mg,  Oral, 3 TIMES DAILY  Qty: 90 Capsule  Refills: 0     ipratropium-albuteroL  0.5 mg-3 mg(2.5 mg base)/3 mL nebulizer solution  Commonly known as: DUONEB   3 mL, Nebulization, 4  TIMES DAILY  Qty: 28 Each  Refills: 0     metoprolol  succinate 50 mg Tablet Sustained Release 24 hr  Commonly known as: TOPROL -XL   1 Tablet, Daily  Refills: 0     nitroGLYCERIN  0.4 mg Tablet, Sublingual  Commonly known as: NITROSTAT    0.4 mg, EVERY 5 MIN PRN  Refills: 0     omeprazole  40 mg Capsule, Delayed Release(E.C.)  Commonly known as: PRILOSEC   40 mg, Oral, 2 TIMES DAILY  Qty: 90 Capsule  Refills: 4     ondansetron  4 mg Tablet, Rapid Dissolve  Commonly known as: ZOFRAN  ODT   4 mg, Oral, EVERY 8 HOURS PRN  Qty: 12 Tablet  Refills: 0     pregabalin 100 mg Capsule  Commonly known as: LYRICA   100 mg  Refills: 0           * This list has 1 medication(s) that are the same as other medications prescribed for you. Read the directions carefully, and ask your doctor or other care provider to review them with you.                ASK your doctor about these medications.        Details   * albuterol  sulfate 90 mcg/actuation oral inhaler  Commonly known as: PROVENTIL  or VENTOLIN  or PROAIR   Ask about: Should I take this medication?   1-2 Puffs, Inhalation, EVERY 6 HOURS PRN  Qty: 1 g  Refills: 0     predniSONE  50 mg Tablet  Commonly known as: DELTASONE   Ask about: Should I take this medication?   50 mg, Oral, Daily  Qty: 5 Tablet  Refills: 0           * This list has 1 medication(s) that are the same as other medications prescribed for you. Read the directions carefully, and ask your doctor or other care provider to review them with you.                Follow up:   No follow-up provider specified.             "Risk" as noted in the medical decision-making is in reference to potential morbidity/mortality of management based upon previously established billing guidelines.    Based upon the clinical setting, the likely diagnosis/impression  include:    Clinical Impression   Cellulitis, unspecified cellulitis site (Primary)   Contact dermatitis, unspecified contact dermatitis type, unspecified trigger         Discharge Medication List as of 10/10/2023  3:28 PM        START taking these medications    Details   diphenhydrAMINE  (BENADRYL  EXTRA STRENGTH) 2-0.1 % Cream Apply topically Four times a day as needed for up to 7 days Do not apply to open wound, only area of allergic reaction.  Disp-15 g, R-0  E-Rx                   This note was partially created using voice recognition software and is inherently subject to errors including those of syntax and "sound alike " substitutions which may escape proof reading. In such instances, original meaning may be extrapolated by contextual derivation.      /R. Ascencion Lava, MD, Rexann Catalan  Department of Emergency Medicine  [facility]

## 2023-10-10 NOTE — Discharge Instructions (Addendum)
 Thank you for allowing us  to be part of your care.    Do not use the adhesive bandage to cover your wound which has caused your reaction. Follow-up with your regular doctor tomorrow.     Please discuss all medications with your pharmacist to ensure there are no concerns of interactions.    Please ensure all questions or concerns are addressed prior to leaving the hospital. We want to make sure your concerns are addressed to make sure you are as safe and healthy as possible. By leaving the hospital, it is understood you are in agreement with your treatment plan.    You may have received sedating medication during your visit. Please discuss this with your discharging provider nurse as you may not be able to operate machines while the medication is in your system, or while you are taking any potentially sedating prescriptions.    Please call the hospital medical records office for a copy of your finalized results, and review them with a primary care physician, for any findings needing further attention.    If you feel your situation worsens, or does not get better in 48 hours, please see a physician for evaluation.    We encourage you to see your regular doctor as soon as possible to let them know you were seen in the emergency department. They may want to do further testing. If you do not have a doctor, please feel free to call the hospital, and ask for contact information of accepting providers. Please also discuss your vaccinations, and ensure all are up to date.    You may use this document to take today off work or school.

## 2023-10-10 NOTE — ED Triage Notes (Signed)
 BEING TREATED FOR BITE ON L ABD. PUT ON ABT, CREAM RX, COVERED WITH BANDAGE. C/O NOT GET BETTER AND BLISTERING FROM TAPE.

## 2023-10-11 ENCOUNTER — Ambulatory Visit (INDEPENDENT_AMBULATORY_CARE_PROVIDER_SITE_OTHER): Payer: Self-pay | Admitting: Otolaryngology

## 2023-10-11 NOTE — Telephone Encounter (Addendum)
-----   Message from Norleen Beaver, MD sent at 10/11/2023  3:47 PM EDT -----  Regarding: RE: New Appointment  ----- Message from Lu Rump sent at 10/08/2023 10:58 AM EDT -----  Copied From CRM [id number].Gomez, Caitlyn Mae () called to schedule an appointment.     Gomez pt wanting to be put off work due to her illness.    [id number]      Gomez, Caitlyn Hook, MD  Dorothyann Gather, RN  Like for a day or two or as needed?    Spoke to patient, states that Caitlyn Gomez is her primary care and she put her off for two weeks. Patient states that she has issues with standing for long periods of time. She attempted to go to work on Thursday and had issues due to her dizziness. Patient states that she is concerned that she drives an hour to work and an hour back.  Patient states that her fiance, on his days off, are able to take her to and from appointments. Patient wanting Dr. Clancy Gomez opinion on if she should work. I advised that I would discuss with Dr. Marti Gomez and let her know. Patient stated understanding and denies further questions    Gomez, Caitlyn Hook, MD  Dorothyann Gather, RN  Caller: Unspecified (3 days ago, 10:55 AM)  Yes.  In acute flair ups may require leave but not long term    Patient notified of the above. Note placed in chart. She is going to call back with fax number. Pt denies further questions

## 2023-11-09 ENCOUNTER — Encounter (INDEPENDENT_AMBULATORY_CARE_PROVIDER_SITE_OTHER): Payer: Self-pay | Admitting: Surgery

## 2023-11-12 ENCOUNTER — Encounter (INDEPENDENT_AMBULATORY_CARE_PROVIDER_SITE_OTHER): Payer: Self-pay | Admitting: Surgery

## 2023-11-12 NOTE — Nursing Note (Signed)
 No-show letter mailed.  [address] [place], [place]  11/12/2023 14:16

## 2023-11-17 NOTE — Progress Notes (Signed)
Patient identified by name and date of birth.  The medication list has been reviewed and updated with the patient/caregiver. At the end of today's visit, the patient will receive a copy of an after visit summary which includes any orders entered, current medications and any future appointments.

## 2023-12-09 ENCOUNTER — Ambulatory Visit
Admission: RE | Admit: 2023-12-09 | Discharge: 2023-12-09 | Disposition: A | Discharge status: Home / Self Care | Payer: Self-pay | Source: Ambulatory Visit | Attending: Otolaryngology

## 2023-12-09 ENCOUNTER — Other Ambulatory Visit: Payer: Self-pay

## 2023-12-09 DIAGNOSIS — D333 Benign neoplasm of cranial nerves: Secondary | ICD-10-CM | POA: Insufficient documentation

## 2023-12-09 MED ORDER — GADOPICLENOL 0.5 MMOL/ML INTRAVENOUS SOLUTION
14.0000 mL | INTRAVENOUS | Status: AC
Start: 2023-12-09 — End: 2023-12-09
  Administered 2023-12-09: 14 mL via INTRAVENOUS

## 2023-12-14 DIAGNOSIS — D333 Benign neoplasm of cranial nerves: Secondary | ICD-10-CM

## 2023-12-17 ENCOUNTER — Emergency Department (HOSPITAL_COMMUNITY)

## 2023-12-17 ENCOUNTER — Ambulatory Visit (INDEPENDENT_AMBULATORY_CARE_PROVIDER_SITE_OTHER): Payer: Self-pay | Admitting: Otolaryngology

## 2023-12-17 ENCOUNTER — Encounter (HOSPITAL_COMMUNITY): Payer: Self-pay | Admitting: NURSE PRACTITIONER

## 2023-12-17 ENCOUNTER — Emergency Department
Admission: EM | Admit: 2023-12-17 | Discharge: 2023-12-17 | Disposition: A | Discharge status: Home / Self Care | Source: Home / Self Care | Attending: NURSE PRACTITIONER | Admitting: NURSE PRACTITIONER

## 2023-12-17 ENCOUNTER — Other Ambulatory Visit: Payer: Self-pay

## 2023-12-17 DIAGNOSIS — D333 Benign neoplasm of cranial nerves: Secondary | ICD-10-CM | POA: Insufficient documentation

## 2023-12-17 DIAGNOSIS — R2689 Other abnormalities of gait and mobility: Secondary | ICD-10-CM

## 2023-12-17 DIAGNOSIS — H9201 Otalgia, right ear: Secondary | ICD-10-CM

## 2023-12-17 LAB — CBC WITH DIFF
BASOPHIL #: 0.1 10*3/uL (ref 0.00–0.10)
BASOPHIL %: 1 % (ref 0–1)
EOSINOPHIL #: 0.1 10*3/uL (ref 0.00–0.50)
EOSINOPHIL %: 2 % (ref 1–7)
HCT: 42 % — ABNORMAL HIGH (ref 31.2–41.9)
HGB: 13.9 g/dL (ref 10.9–14.3)
LYMPHOCYTE #: 1.7 10*3/uL (ref 1.10–3.10)
LYMPHOCYTE %: 23 % (ref 16–46)
MCH: 28.7 pg (ref 24.7–32.8)
MCHC: 33.1 g/dL (ref 32.3–35.6)
MCV: 86.7 fL (ref 75.5–95.3)
MONOCYTE #: 0.6 10*3/uL (ref 0.20–0.90)
MONOCYTE %: 9 % (ref 4–11)
MPV: 8.8 fL (ref 7.9–10.8)
NEUTROPHIL #: 4.6 10*3/uL (ref 1.90–8.20)
NEUTROPHIL %: 65 % (ref 43–77)
PLATELETS: 264 10*3/uL (ref 140–440)
RBC: 4.84 10*6/uL (ref 3.63–4.92)
RDW: 13.7 % (ref 12.3–17.7)
WBC: 7.1 10*3/uL (ref 3.8–11.8)

## 2023-12-17 LAB — COMPREHENSIVE METABOLIC PANEL, NON-FASTING
ALBUMIN/GLOBULIN RATIO: 1.1 (ref 0.8–1.4)
ALBUMIN: 4 g/dL (ref 3.5–5.7)
ALKALINE PHOSPHATASE: 70 U/L (ref 34–104)
ALT (SGPT): 16 U/L (ref 7–52)
ANION GAP: 9 mmol/L (ref 4–13)
AST (SGOT): 17 U/L (ref 13–39)
BILIRUBIN TOTAL: 0.5 mg/dL (ref 0.3–1.0)
BUN/CREA RATIO: 12 (ref 6–22)
BUN: 10 mg/dL (ref 7–25)
CALCIUM, CORRECTED: 9.1 mg/dL (ref 8.9–10.8)
CALCIUM: 9.1 mg/dL (ref 8.6–10.3)
CHLORIDE: 104 mmol/L (ref 98–107)
CO2 TOTAL: 26 mmol/L (ref 21–31)
CREATININE: 0.86 mg/dL (ref 0.60–1.30)
ESTIMATED GFR: 85 mL/min/{1.73_m2} (ref >59)
GLOBULIN: 3.5 (ref 2.0–3.5)
GLUCOSE: 97 mg/dL (ref 74–109)
OSMOLALITY, CALCULATED: 277 mosm/kg (ref 270–290)
POTASSIUM: 4.3 mmol/L (ref 3.5–5.1)
PROTEIN TOTAL: 7.5 g/dL (ref 6.4–8.9)
SODIUM: 139 mmol/L (ref 136–145)

## 2023-12-17 LAB — LIGHT GREEN TOP TUBE

## 2023-12-17 LAB — C-REACTIVE PROTEIN(CRP),INFLAMMATION: C-REACTIVE PROTEIN (CRP): 1.5 mg/dL — ABNORMAL HIGH (ref 0.1–0.5)

## 2023-12-17 MED ORDER — HYDROCODONE 5 MG-ACETAMINOPHEN 325 MG TABLET
ORAL_TABLET | ORAL | Status: AC
Start: 2023-12-17 — End: 2023-12-17
  Filled 2023-12-17: qty 2

## 2023-12-17 MED ORDER — HYDROCODONE 5 MG-ACETAMINOPHEN 325 MG TABLET
2.0000 | ORAL_TABLET | ORAL | Status: AC
Start: 2023-12-17 — End: 2023-12-17
  Administered 2023-12-17: 2 via ORAL

## 2023-12-17 MED ORDER — HYDROCODONE 5 MG-ACETAMINOPHEN 325 MG TABLET: 1.0000 | ORAL_TABLET | Freq: Four times a day (QID) | ORAL | 0 refills | Status: DC | PRN

## 2023-12-17 MED ORDER — IOHEXOL 350 MG IODINE/ML INTRAVENOUS SOLUTION
50.0000 mL | INTRAVENOUS | Status: AC
Start: 2023-12-17 — End: 2023-12-17
  Administered 2023-12-17: 75 mL via INTRAVENOUS

## 2023-12-17 MED ORDER — METHYLPREDNISOLONE 4 MG TABLETS IN A DOSE PACK: ORAL_TABLET | ORAL | 0 refills | Status: DC

## 2023-12-17 NOTE — ED Triage Notes (Signed)
 Rt ear pressure, dizziness, general malaise. 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

## 2023-12-17 NOTE — Discharge Instructions (Signed)

## 2023-12-17 NOTE — ED APP Handoff Note (Signed)
 [facility] - Emergency Department  Emergency Department  Provider in Triage Note    Name: Caitlyn Gomez  Age: 48 y.o.  Gender: female     Subjective:   Caitlyn Gomez is a 48 y.o. female who presents with complaint of Ear Pain and Dizziness  .  Patient with a history of the right vestibular schwannoma presents with complaints of increased right ear pain and pressure for the last few days.  She had imaging 2 months ago that shows that this tumor had increased in size.  She follows with [facility] for this.  She called their office this morning to leave a message but then decided to come to the emergency room because she can no longer tolerate the pain.  It is causing her to have imbalance.    Objective:   Filed Vitals:    12/17/23 1113   BP: (!) 130/92   Pulse: 75   Resp: 18   Temp: 36.5 C (97.7 F)   SpO2: 98%      Focused Physical Exam shows adult female upright alert in no apparent distress.  Bilateral TMs clear.  Bilateral TM jay's without tenderness.  Bilateral mastoids without tenderness.  Oropharynx is edentulous without any erythema or swelling.    Assessment:  A medical screening exam was completed.  This patient is a 48 y.o. female with initial findings showing right ear pain in a patient with a known vestibular schwannoma.    Plan:  Please see initial orders and work-up below.  This is to be continued with full evaluation in the main Emergency Department.     No current facility-administered medications for this encounter.     No results found for this or any previous visit (from the past 24 hours).     Kennedy Peabody, PA-C  12/17/2023, 11:19

## 2023-12-17 NOTE — ED Nurses Note (Signed)
 Pt evaluated and treated by medical provider while in waiting room area. No primary RN assigned; therefore, no assessment performed. All paperwork given and questions answered. Pt acknowledged all understanding. Leaving waiting area.

## 2023-12-17 NOTE — ED Provider Notes (Signed)
  [facility]  ED Primary Provider Note  Patient Name: Caitlyn Gomez  Patient Age: 48 y.o.  Date of Birth: 12/14/1975    Chief Complaint: Ear Pain and Dizziness        History of Present Illness       Caitlyn Gomez is a 48 y.o. female who had concerns including Ear Pain and Dizziness.  Patient presents to ED today for ear pain and dizziness.  Patient has a history of a vestibular schwannoma.  Patient follows with a Dr. Marti Slates in [place].  Patient states she did call the office today and they were unable to get back with her.  Patient states he is concerned as he is having more issues with the balance and that the pain is worse.  Upon initial assessment patient is sitting in the chair eating to doritos.  Patient is also ambulating throughout the emergency department without any difficulties or signs of gait abnormalities.        Review of Systems     No other overt Review of Systems are noted to be positive except noted in the HPI.      Historical Data   History Reviewed This Encounter: Medical History  Surgical History  Family History  Social History      Physical Exam   ED Triage Vitals [12/17/23 1113]   BP (Non-Invasive) (!) 130/92   Heart Rate 75   Respiratory Rate 18   Temperature 36.5 C (97.7 F)   SpO2 98 %   Weight (!) 142 kg (314 lb)   Height 1.676 m (5\' 6" )         Nursing notes reviewed for what could be assessed. Past Medical, Surgical, and Social history reviewed for what has been completed.     Constitutional: NAD. Well-Developed. Well Nourished.  Head: Normocephalic, atraumatic.  Mouth/Throat:  Moist mucous membranes.  Eyes: EOM grossly intact, conjunctiva normal.  Neck: Supple  Cardiovascular: Regular Rate and Rhythm, extremities well perfused.  Pulmonary/Chest: No respiratory distress. Lungs are symmetric to auscultation bilaterally.    Abdominal: Soft, non-tender, non-distended. Non peritoneal, no rebound, no guarding.  MSK: No Lower Extremity  Edema.  Skin: Warm, dry, and intact  Neuro: Appropriate, CN II-XII grossly intact. Gait at patient's baseline  Psych: Pleasant             Procedures      Patient Data     Labs Ordered/Reviewed   C-REACTIVE PROTEIN(CRP),INFLAMMATION - Abnormal; Notable for the following components:       Result Value    C-REACTIVE PROTEIN (CRP) 1.5 (*)     All other components within normal limits   CBC WITH DIFF - Abnormal; Notable for the following components:    HCT 42.0 (*)     All other components within normal limits   COMPREHENSIVE METABOLIC PANEL, NON-FASTING - Normal    Narrative:     Estimated Glomerular Filtration Rate (eGFR) is calculated using the CKD-EPI (2021) equation, intended for patients 57 years of age and older. If gender is not documented or "unknown", there will be no eGFR calculation.     CBC/DIFF    Narrative:     The following orders were created for panel order CBC/DIFF.  Procedure                               Abnormality         Status                     ---------                               -----------         ------  CBC WITH DIFF[[id number]]                Abnormal            Final result                 Please view results for these tests on the individual orders.       CT BRAIN W/WO IV CONTRAST   Final Result by Edi, Radresults In (05/30 1313)      No acute findings.      Known right cerebellopontine angle mass/vestibular schwannoma, better characterized on recent dedicated MRI.      Small right mastoid effusion, could be chronic. Please correlate for acute mastoiditis.         One or more dose reduction techniques were used (e.g., Automated exposure control, adjustment of the mA and/or kV according to patient size, use of iterative reconstruction technique).         Radiologist location ID: [id number]             Medical Decision Making        Medical Decision Making  Amount and/or Complexity of Data Reviewed  Radiology:  Decision-making details documented in ED  Course.    Risk  Prescription drug management.          Studies Assessed:  Labs, imaging      MDM Narrative:  Patient presents to ED today for ear pain and dizziness.  Patient has a history of a vestibular schwannoma.  Patient follows with a Dr. Marti Slates in [place].  Patient states she did call the office today and they were unable to get back with her.  Patient states he is concerned as he is having more issues with the balance and that the pain is worse.  Upon initial assessment patient is sitting in the chair eating to doritos.  Patient is also ambulating throughout the emergency department without any difficulties or signs of gait abnormalities.  Patient's labs were unremarkable.  CT brain does not show any change from the MRI that has patient had completed on 12/09/2023 of the vestibular schwannoma.  I did review that imaging.  The imaging showed right vestibular schwannoma had increase in side from the previous studies now measures up to 2.2 cm.  I did inform the patient that I reviewed the MRI and that this was the finding that was found.  Patient did become tearful.  I consulted the patient.  I did attempt to multiple times to get into contact with the patient's provider in [place] however I was unable to do so.  I reach out to the ENT providers on-call with the mars line and discussed appropriate pain management at discharge.  Instructed patient to call Dr. Marti Slates to follow up.  Patient verbalized understanding.  Patient was discharged      ED Course as of 12/17/23 1741   Fri Dec 17, 2023   1327 CT BRAIN W/WO IV CONTRAST    IMPRESSION:     No acute findings.     Known right cerebellopontine angle mass/vestibular schwannoma, better characterized on recent dedicated MRI.     Small right mastoid effusion, could be chronic. Please correlate for acute mastoiditis.     1538 Patient is ambulating throughout our emergency department without difficulty.  No gait staggering was noted.          Medications Ordered/Administered in the ED   iohexol  (OMNIPAQUE  350) infusion (75 mL Intravenous Given 12/17/23 1300)   HYDROcodone -acetaminophen  (  NORCO) 5-325 mg per tablet (2 Tablets Oral Given 12/17/23 1517)       Following the history, physical exam, and ED workup, the patient was deemed stable and suitable for discharge. The patient/caregiver was advised to return to the ED for any new or worsening symptoms. Discharge medications, and follow-up instructions were discussed with the patient/caregiver in detail, who verbalizes understanding. The patient/caregiver is in agreement and is comfortable with the plan of care.    Disposition: Discharged         Current Discharge Medication List        START taking these medications.        Details   HYDROcodone -acetaminophen  5-325 mg Tablet  Commonly known as: NORCO   1 Tablet, Oral, EVERY 6 HOURS PRN  Qty: 12 Tablet  Refills: 0     Methylprednisolone  4 mg Tablets, Dose Pack  Commonly known as: MEDROL  DOSEPACK   Take as instructed.  Qty: 21 Tablet  Refills: 0            CONTINUE these medications - NO CHANGES were made during your visit.        Details   Anoro Ellipta  62.5-25 mcg/actuation oral diskus inhaler  Generic drug: umeclidinium-vilanteroL   1 Puff, Daily  Refills: 0     apixaban  5 mg Tablet  Commonly known as: ELIQUIS    5 mg, 2 TIMES DAILY  Refills: 0     atorvastatin  40 mg Tablet  Commonly known as: LIPITOR   40 mg, EVERY EVENING  Refills: 0     ipratropium-albuteroL  0.5 mg-3 mg(2.5 mg base)/3 mL nebulizer solution  Commonly known as: DUONEB   3 mL, Nebulization, 4 TIMES DAILY  Qty: 28 Each  Refills: 0     metoprolol  succinate 50 mg Tablet Sustained Release 24 hr  Commonly known as: TOPROL -XL   1 Tablet, Daily  Refills: 0     nitroGLYCERIN  0.4 mg Tablet, Sublingual  Commonly known as: NITROSTAT    0.4 mg, EVERY 5 MIN PRN  Refills: 0     omeprazole  40 mg Capsule, Delayed Release(E.C.)  Commonly known as: PRILOSEC   40 mg, Oral, 2 TIMES DAILY  Qty: 90  Capsule  Refills: 4     ondansetron  4 mg Tablet, Rapid Dissolve  Commonly known as: ZOFRAN  ODT   4 mg, Oral, EVERY 8 HOURS PRN  Qty: 12 Tablet  Refills: 0     pregabalin 100 mg Capsule  Commonly known as: LYRICA   100 mg  Refills: 0     ProAir  HFA 90 mcg/actuation oral inhaler  Generic drug: albuterol  sulfate   1 Puff, Inhalation, EVERY 4 HOURS PRN  Qty: 6.7 g  Refills: 3            Follow up:   Jan Mcgill, FNP  [address]  [place] [address]  [phone]          Marti Slates, Adline Hook, MD  [address]  [address]  [place] [address]  [phone]                     Clinical Impression   Vestibular schwannoma (CMS [facility]) (Primary)         Discharge Medication List as of 12/17/2023  3:38 PM        START taking these medications    Details   HYDROcodone -acetaminophen  (NORCO) 5-325 mg Oral Tablet Take 1 Tablet by mouth Every 6 hours as needed for Pain, Disp-12 Tablet,  R-0, E-Rx      Methylprednisolone  (MEDROL  DOSEPACK) 4 mg Oral Tablets, Dose Pack Take as instructed., Disp-21 Tablet, R-0, E-Rx                 Harley Lies FNP-C  Department of Emergency Medicine

## 2023-12-17 NOTE — Telephone Encounter (Signed)
 Regarding: Clinical Question / Dr Marti Slates  ----- Message from Lovell Rubenstein sent at 12/17/2023 10:15 AM EDT -----  Copied From CRM #[id number].  Elecia, Serafin Mae () called with a clinical question. Pt seen Dr Marti Slates on 09/30/23. Pt states that her ear is still causing her some pain and discomfort; pt states that she feels pressure in her ears & on the side of her face. Pt wants to know if Dr Marti Slates wants to see her or if he can prescribe her something. Also, pt wants to know the results of her MRI / Pet Scan. Can you please call pt back to advise? Her # is [phone]. Thank you     Preferred Pharmacy     [facility] [phone] [place], [place] - [address] AT [address]   [place]    [address] [place] [address]    Phone: [phone] Fax: [phone]    Hours: Not open 24 hours    Kellermeyer, Adline Hook, MD  Dorothyann Gather, RN  Cc: Denman Fischer, [facility]  Caller: Unspecified (3 days ago, 10:09 AM)  This needs routed to Dr. Harles Lied who is managing the VS.  MRI showed that it has increased in size and she needs a closer followup with Dr. Harles Lied (now scheduled in October).    Spoke to patient, advised of the above. Transferred call to NSGY department. Patient denied further questions

## 2023-12-17 NOTE — Telephone Encounter (Signed)
 PA from [facility] calling, states that they are about to discharge patient and asking for recommendations or what kind of pain medication to give her. She tried to reach Dr. Marti Slates via secure chat but was unsuccessful. I advised that I had also contacted him with no response yet. I advised that if she needs any kind of recommendation in terms of treatment or medication, she will need to contact our ENT on call. PA states that she will just contact their ENT on call. No further questions noted.

## 2024-01-10 ENCOUNTER — Ambulatory Visit: Payer: Self-pay | Attending: Neurological Surgery | Admitting: Neurological Surgery

## 2024-01-10 ENCOUNTER — Other Ambulatory Visit: Payer: Self-pay

## 2024-01-10 ENCOUNTER — Encounter (INDEPENDENT_AMBULATORY_CARE_PROVIDER_SITE_OTHER): Payer: Self-pay | Admitting: Neurological Surgery

## 2024-01-10 VITALS — BP 140/77 | HR 75 | Temp 96.9°F | Ht 66.0 in | Wt 315.0 lb

## 2024-01-10 DIAGNOSIS — H9190 Unspecified hearing loss, unspecified ear: Secondary | ICD-10-CM | POA: Insufficient documentation

## 2024-01-10 DIAGNOSIS — H538 Other visual disturbances: Secondary | ICD-10-CM | POA: Insufficient documentation

## 2024-01-10 DIAGNOSIS — G935 Compression of brain: Secondary | ICD-10-CM | POA: Insufficient documentation

## 2024-01-10 DIAGNOSIS — H9191 Unspecified hearing loss, right ear: Secondary | ICD-10-CM

## 2024-01-10 DIAGNOSIS — D333 Benign neoplasm of cranial nerves: Secondary | ICD-10-CM | POA: Insufficient documentation

## 2024-01-10 NOTE — Progress Notes (Signed)
 NEUROSURGERY, PHYSICIAN OFFICE CENTER  [address]  [place] [address]  Operated by [facility]  Progress Note    Name: Caitlyn Gomez MRN:  [id number]   Date: 01/10/2024 DOB:  1976/05/30 (47 y.o.)           Referring Provider:   Jarold City, FNP  [address]  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      Seen in clinic on 01/25/2023. She reported that she underwent two EMGs at [facility] and was evaluated by neurosurgery there, no surgical intervention offered to her. She complains of pain in her neck radiating down her spine that she describes as a jolting, tingling/numbness/burning into all of her fingers bilaterally, difficulty with her dexterity (left worse than right), balance difficulties, headaches, limited range of motion in her left shoulder (chronic). She denies recent falls/injuries. She notes that she was on Lyrica with some relief of her symptoms, prescriber will no longer prescribe this. She reports that she was referred to a pain management clinic in [place] . MRI discussed with patient by telephone visit with the patient on 04/30/2023.    Today 01/10/2024 the patient she has recently been seen numerous times in the ED, the last being on May 30th for ear pain and dizziness. She states she had a syncopal event when at the beach, she was in the hotel when she went to stand up and everything went black. She had a fall, partially hitting the bed which broke the fall. No medical attention was sought. She endorses blurred vision, she got new glasses which she says has helped. She also has a hearing loss in her right ear. The patient denies dysphagia, seizures, unilateral weakness or numbness, acute cognitive changes, recent falls or injuries. She feels like  the right side of her face feels a little numb, but she has no weakness. She also endorses a runny nose which is new. She has a tremor in her LUE, which occurred after a fall and surgery to her left shoulder/arm and back. Will refer to Dr. Lucien for a GK evaluation. RTC as determined after consult.       Current Outpatient Medications   Medication Sig    ANORO ELLIPTA  62.5-25 mcg/actuation Inhalation oral diskus inhaler Take 1 Puff by inhalation Once a day    apixaban  (ELIQUIS ) 5 mg Oral Tablet Take 1 Tablet (5 mg total) by mouth Twice daily    atorvastatin  (LIPITOR) 40 mg Oral Tablet Take 1 Tablet (40 mg total) by mouth Every evening    HYDROcodone -acetaminophen  (NORCO) 5-325 mg Oral Tablet Take 1 Tablet by mouth Every 6 hours as needed for Pain    ipratropium-albuterol  0.5 mg-3 mg(2.5 mg base)/3 mL Solution for Nebulization Take 3 mL by nebulization Four times a day for 7 days (Patient taking differently: Take 3 mL by nebulization Four times a day as needed)    Methylprednisolone  (MEDROL  DOSEPACK) 4 mg Oral Tablets, Dose Pack Take as instructed.    metoprolol  succinate (TOPROL -XL) 50 mg Oral Tablet Sustained Release 24 hr Take 1 Tablet (50 mg total) by mouth Once a day    nitroGLYCERIN  (NITROSTAT ) 0.4 mg Sublingual Tablet, Sublingual Place 1 Tablet (0.4 mg total) under the tongue Every 5  minutes as needed for Chest pain for 3 doses over 15 minutes    omeprazole  (PRILOSEC) 40 mg Oral Capsule, Delayed Release(E.C.) Take 1 Capsule (40 mg total) by mouth Twice daily    ondansetron  (ZOFRAN  ODT) 4 mg Oral Tablet, Rapid Dissolve Take 1 Tablet (4 mg total) by mouth Every 8 hours as needed for Nausea/Vomiting    pregabalin (LYRICA) 100 mg Oral Capsule Take 1 Capsule (100 mg total) by mouth    PROAIR  HFA 90 mcg/actuation Inhalation oral inhaler Take 1 Puff by inhalation Every 4 hours as needed     Allergies[1]   Past Medical History:   Diagnosis Date    A-fib     Dr. GORMAN. Rana    Abnormal Pap smear     Anxiety      Arthropathy     Asthma     Blastoma (CMS HCC)     ears    Brain tumor (benign)     Cervical myelopathy (CMS HCC)     Chiari I malformation (CMS HCC) 2021    Chronic obstructive airway disease     Complex regional pain syndrome I     Coronary artery disease     Depression     Depression 10/05/2021    Dizziness     Dysplasia of cervix     Dysrhythmias     a fib    Ectopic pregnancy     Fibromyalgia     GERD (gastroesophageal reflux disease)     controlled with med    History of anesthesia complications     aspirated for scope last time    History of cervical cancer     HTN (hypertension)     Hyperlipidemia     NAFLD (nonalcoholic fatty liver disease)     Neck mass     PCOS (polycystic ovarian syndrome)     Radiculopathy     Smoking addiction     Stroke     TIA- [place] - 2016 - (left facial droop and left arm weakness)    Thyroid  disorder     nodule    Thyroid  nodule     Varicosities      Past Surgical History:   Procedure Laterality Date    BLADDER SURGERY  2023    mesh placed    COLONOSCOPY N/A 08/11/2023    Performed by Duremdes, Gene B, MD at [facility] OR T&D    CYSTOSCOPY  11/04/2022    cystoscopy, retropubic mid urethral sling    CYSTOSCOPY; RETROPUBIC MIDURETHRAL SLING N/A 11/04/2022    Performed by Derek Pac, DO at [facility] OR MAIN    GASTROSCOPY N/A 09/24/2020    Performed by Floretta Mora, DO at [facility] OR ENDO    GASTROSCOPY WITH BIOPSY N/A 09/24/2020    Performed by Floretta Mora, DO at [facility] OR ENDO    HX APPENDECTOMY      HX DILATION AND CURETTAGE      HX ENDOMETRIAL ABLATION      HX HEART CATHETERIZATION      stent    HX HYSTERECTOMY      HX TONSILLECTOMY      HX TUBAL LIGATION       Social History     Socioeconomic History    Marital status: Legally Separated     Spouse name: Not on file    Number of children: 2    Years of education: Not on file    Highest education level: Not on file  Occupational History    Occupation: Scientist, research (medical)   Tobacco Use    Smoking status: Every Day     Current packs/day: 1.00      Average packs/day: 1 pack/day for 28.5 years (28.5 ttl pk-yrs)     Types: Cigarettes     Start date: 1997    Smokeless tobacco: Never   Vaping Use    Vaping status: Never Used   Substance and Sexual Activity    Alcohol use: Not Currently     Alcohol/week: 0.8 standard drinks of alcohol     Types: 1 Glasses of wine per week     Comment: 2-3 times a month;    Drug use: Never    Sexual activity: Yes     Partners: Male     Birth control/protection: Other   Other Topics Concern    Abuse/Domestic Violence Not Asked    Breast Self Exam Not Asked    Caffeine Concern Not Asked    Calcium intake adequate Not Asked    Computer Use Not Asked    Drives Not Asked    Exercise Concern Not Asked    Helmet Use Not Asked    Seat Belt Not Asked    Special Diet Not Asked    Sunscreen used Not Asked    Uses Cane Not Asked    Uses walker Not Asked    Uses wheelchair Not Asked    Right hand dominant Not Asked    Left hand dominant Not Asked    Ambidextrous Not Asked    Shift Work Not Asked    Unusual Sleep-Wake Schedule Not Asked    Ability to Walk 1 Flight of Steps without SOB/CP Yes    Routine Exercise No    Ability to Walk 2 Flight of Steps without SOB/CP No     Comment: SOB    Unable to Ambulate Not Asked    Total Care Not Asked    Ability To Do Own ADL's Yes    Uses Walker Not Asked    Other Activity Level Not Asked    Uses Cane Not Asked   Social History Narrative    1 child deceased, 1 living     Social Determinants of Health     Financial Resource Strain: Not on file   Transportation Needs: Not on file   Social Connections: Low Risk  (03/21/2023)    Social Connections     SDOH Social Isolation: 5 or more times a week   Intimate Partner Violence: Not on file   Housing Stability: Not on file         Objective:   Vital Signs:  BP (!) 140/77   Pulse 75   Temp 36.1 C (96.9 F) (Thermal Scan)   Ht 1.676 m (5' 6)   Wt (!) 143 kg (315 lb 0.6 oz)   LMP 09/07/2009   BMI 50.85 kg/m       Constitutional  General appearance:  Normal  Eyes: Normal, PERRL, EOMI  Cardiovascular:   Peripheral vascular system: Normal  HEENT:    HEENT:  Normal  Musculoskeletal  Gait and Station: : Normal  Muscle strength (upper extremities): : Normal  Muscle strength (lower extremities): : Normal  Muscle tone (upper extremities): : Normal  Muscle tone (lower extremities): : Normal  Sensation: Normal  Coordination: Normal  Neurological  Orientation: Normal  Recent and remote memory: Normal  Attention span and concentration: Normal  Language: Normal  Fund of knowledge: Normal  Cranial Nerves  2nd: Normal  3rd,4th,6th: Normal  5th: Normal  7th: Normal  8th: Normal  9th: Normal  11th: Normal  12th: Normal    Data reviewed    MRI IAC w/wo on 12/09/2023  FINDINGS: An enhancing mass filling the right internal auditory canal and extending into the cerebellopontine angle cistern, which measures approximately 2.2 x 1.2 x 1.0 cm is further increased in size since prior exam from 04/24/2023, when it measured approximately 2.0 x 1.0 x 0.9 cm. The lesion is more noticeably enlarged compared to the MRI exam performed 10/27/2021, when it measured approximately 1.7 x 0.7 x 0.6 cm. A cystic component within the medial aspect of the lesion has also increased in size. Mild chronic blood products are seen associated with the lesion. The mass abuts and mildly displaces the right middle cerebellar peduncle. There is no parenchymal edema.     There is no acute infarct or extra-axial fluid collection. No acute intracranial hemorrhage. A chronic microhemorrhage along the lateral left cerebellum is unchanged. Few scattered punctate deep and subcortical white matter foci of T2/FLAIR hyperintensity are nonspecific. There is no hydrocephalus. The pituitary gland is not enlarged. Cerebellar tonsils protrude approximately 8.5 mm below the foramen magnum.      Major intracranial vascular flow-voids are intact.      Minimal right mastoid fluid is nonspecific.      IMPRESSION:  1. Right  vestibular schwannoma has increased in size compared to previous studies and now measures up to 2.2 cm.  2. Chiari type I malformation.    Imaging reviewed by Dr. Ezzard    Discussions with other providers:   Reviewed chart notes    Assessment:      ICD-10-CM    1. Chiari malformation type I (CMS HCC)  G93.5       2. Vestibular schwannoma (CMS HCC)  D33.3       3. Hearing loss, unspecified hearing loss type, unspecified laterality  H91.90       4. Blurred vision  H53.8           Recommendations:    -The natural history, film findings, and indications for treatment were discussed.  -Referral  to Dr. Lucien for GK evaluation  -RTC as determined after her consult.   -The patient has been advised to follow up with their PCP in regards any chronic medical conditions and any non-neurosurgical symptoms that they may have   -Continue Medical Management (Diet, Exercise, Medication).      Julia Giacopetti, FNP-C  The patient was seen as a shared visit with the co-signing faculty.     I personally saw and evaluated the patient as part of a shared service with an APP.    My substantive findings are:  MDM (complete) growing VS, plan as above    I independently of the APP spent a total of (20) minutes in direct/indirect care of this patient including initial evaluation, review of laboratory, radiology, diagnostic studies, review of medical record, order entry and coordination of care.          [1]   Allergies  Allergen Reactions    Bee Venom Protein (Honey Bee) Anaphylaxis    Hymenoptera Allergenic Extract      Other reaction(s): UNKNOWN    Penicillins Hives/ Urticaria and Rash     Other reaction(s): Rash, Skin Irritation  Rash and swellling      Sulfa (Sulfonamides) Rash and Hives/ Urticaria     Other Reaction(s): Rash-Raised  Rash and swelling    Amoxicillin Rash

## 2024-01-11 ENCOUNTER — Ambulatory Visit (INDEPENDENT_AMBULATORY_CARE_PROVIDER_SITE_OTHER): Payer: Self-pay

## 2024-01-11 NOTE — Telephone Encounter (Signed)
 Patient notified of appt date and time.    Sydnee Cabal, PATIENT NAVIGATOR

## 2024-01-25 ENCOUNTER — Encounter (INDEPENDENT_AMBULATORY_CARE_PROVIDER_SITE_OTHER): Payer: Self-pay | Admitting: Anesthesiology

## 2024-01-25 ENCOUNTER — Other Ambulatory Visit: Payer: Self-pay

## 2024-01-25 ENCOUNTER — Ambulatory Visit: Payer: Self-pay | Attending: Anesthesiology | Admitting: Anesthesiology

## 2024-01-25 VITALS — BP 126/52 | HR 99 | Temp 96.7°F | Ht 66.0 in | Wt 314.6 lb

## 2024-01-25 DIAGNOSIS — R110 Nausea: Secondary | ICD-10-CM | POA: Insufficient documentation

## 2024-01-25 DIAGNOSIS — R42 Dizziness and giddiness: Secondary | ICD-10-CM | POA: Insufficient documentation

## 2024-01-25 DIAGNOSIS — G935 Compression of brain: Secondary | ICD-10-CM

## 2024-01-25 DIAGNOSIS — D333 Benign neoplasm of cranial nerves: Secondary | ICD-10-CM | POA: Insufficient documentation

## 2024-01-25 MED ORDER — MECLIZINE 25 MG TABLET
25.0000 mg | ORAL_TABLET | Freq: Two times a day (BID) | ORAL | 0 refills | Status: AC | PRN
Start: 2024-01-25 — End: ?

## 2024-01-25 NOTE — Progress Notes (Signed)
 NEUROSURGERY, PHYSICIAN OFFICE CENTER  [address]  [place] [address]  Operated by [facility]  Progress Note    Name: Caitlyn Gomez MRN:  [id number]   Date: 01/25/2024 DOB:  February 17, 1976 (48 y.o.)           Referring Provider:   No referring provider defined for this encounter.          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      Today 01/10/2024 the patient she has recently been seen numerous times in the ED, the last being on May 30th for ear pain and dizziness. She states she had a syncopal event [place] the beach, she was in the hotel when she went to stand up and everything went black. She had a fall, partially hitting the bed which broke the fall. No medical attention was sought. She endorses blurred vision, she got new glasses which she says has helped. She also has a hearing loss in her right ear. The patient denies dysphagia, seizures, unilateral weakness or numbness, acute cognitive changes, recent falls or injuries. She feels like the right side of her face feels a little numb, but she has no weakness. She also endorses a runny nose which is new. She has a tremor in her LUE, which occurred after a fall and surgery to her left shoulder/arm and back. MRI Brain 12/09/2023 demonstrated an increased size in the right vestibular schwannoma. Will refer to Dr. Lucien for a GK evaluation. RTC as determined after consult.     Today 01/25/2024 the patient has continued nausea and vertigo which is worsening, she occasionally has emesis as well. Vertigo comes on quickly and she feels as if she turns to jelly. She holds on to whatever is nearby so that she doesn't fall. She did have a fall but it wasn't to the ground, she fell into the couch. She also states that her appetite has changed, she just feels weak and  tired. Ordered Meclizine  25 mg BID PRN for vertigo, escript sent to pharmacy. Patient consented for GK. RTC as instructed after the procedure.     Current Outpatient Medications   Medication Sig    ANORO ELLIPTA  62.5-25 mcg/actuation Inhalation oral diskus inhaler Take 1 Puff by inhalation Once a day    apixaban  (ELIQUIS ) 5 mg Oral Tablet Take 1 Tablet (5 mg total) by mouth Twice daily    atorvastatin  (LIPITOR) 40 mg Oral Tablet Take 1 Tablet (40 mg total) by mouth Every evening    HYDROcodone -acetaminophen  (NORCO) 5-325 mg Oral Tablet Take 1 Tablet by mouth Every 6 hours as needed for Pain    ipratropium-albuterol  0.5 mg-3 mg(2.5 mg base)/3 mL Solution for Nebulization Take 3 mL by nebulization Four times a day for 7 days (Patient taking differently: Take 3 mL by nebulization Four times a day as needed)    Methylprednisolone  (MEDROL  DOSEPACK) 4 mg Oral Tablets, Dose Pack Take as instructed.    metoprolol  succinate (TOPROL -XL) 50 mg Oral Tablet Sustained Release 24 hr Take 1 Tablet (50 mg total) by mouth Once a day    nitroGLYCERIN  (NITROSTAT ) 0.4 mg Sublingual Tablet, Sublingual Place 1 Tablet (0.4 mg total) under the tongue Every 5 minutes as needed for Chest pain for 3 doses over 15 minutes    omeprazole  (PRILOSEC) 40  mg Oral Capsule, Delayed Release(E.C.) Take 1 Capsule (40 mg total) by mouth Twice daily    ondansetron  (ZOFRAN  ODT) 4 mg Oral Tablet, Rapid Dissolve Take 1 Tablet (4 mg total) by mouth Every 8 hours as needed for Nausea/Vomiting    pregabalin (LYRICA) 100 mg Oral Capsule Take 1 Capsule (100 mg total) by mouth    PROAIR  HFA 90 mcg/actuation Inhalation oral inhaler Take 1 Puff by inhalation Every 4 hours as needed     Allergies[1]   Past Medical History:   Diagnosis Date    A-fib     Dr. GORMAN. Rana    Abnormal Pap smear     Anxiety     Arthropathy     Asthma     Blastoma (CMS HCC)     ears    Brain tumor (benign)     Cervical myelopathy (CMS HCC)     Chiari I malformation (CMS HCC) 2021    Chronic  obstructive airway disease     Complex regional pain syndrome I     Coronary artery disease     Depression     Depression 10/05/2021    Dizziness     Dysplasia of cervix     Dysrhythmias     a fib    Ectopic pregnancy     Fibromyalgia     GERD (gastroesophageal reflux disease)     controlled with med    History of anesthesia complications     aspirated for scope last time    History of cervical cancer     HTN (hypertension)     Hyperlipidemia     NAFLD (nonalcoholic fatty liver disease)     Neck mass     PCOS (polycystic ovarian syndrome)     Radiculopathy     Smoking addiction     Stroke     TIA- [place] - 2016 - (left facial droop and left arm weakness)    Thyroid  disorder     nodule    Thyroid  nodule     Varicosities      Past Surgical History:   Procedure Laterality Date    BLADDER SURGERY  2023    mesh placed    COLONOSCOPY N/A 08/11/2023    Performed by Marlyce Rakers B, MD at [facility] OR T&D    CYSTOSCOPY  11/04/2022    cystoscopy, retropubic mid urethral sling    CYSTOSCOPY; RETROPUBIC MIDURETHRAL SLING N/A 11/04/2022    Performed by Derek Pac, DO at [facility] OR MAIN    GASTROSCOPY N/A 09/24/2020    Performed by Floretta Mora, DO at [facility] OR ENDO    GASTROSCOPY WITH BIOPSY N/A 09/24/2020    Performed by Floretta Mora, DO at [facility] OR ENDO    HX APPENDECTOMY      HX DILATION AND CURETTAGE      HX ENDOMETRIAL ABLATION      HX HEART CATHETERIZATION      stent    HX HYSTERECTOMY      HX TONSILLECTOMY      HX TUBAL LIGATION       Social History     Socioeconomic History    Marital status: Legally Separated     Spouse name: Not on file    Number of children: 2    Years of education: Not on file    Highest education level: Not on file   Occupational History    Occupation: Scientist, research (medical)   Tobacco Use    Smoking status:  Every Day     Current packs/day: 1.00     Average packs/day: 1 pack/day for 28.5 years (28.5 ttl pk-yrs)     Types: Cigarettes     Start date: 1997    Smokeless tobacco: Never   Vaping Use    Vaping  status: Never Used   Substance and Sexual Activity    Alcohol use: Not Currently     Alcohol/week: 0.8 standard drinks of alcohol     Types: 1 Glasses of wine per week     Comment: 2-3 times a month;    Drug use: Never    Sexual activity: Yes     Partners: Male     Birth control/protection: Other   Other Topics Concern    Abuse/Domestic Violence Not Asked    Breast Self Exam Not Asked    Caffeine Concern Not Asked    Calcium intake adequate Not Asked    Computer Use Not Asked    Drives Not Asked    Exercise Concern Not Asked    Helmet Use Not Asked    Seat Belt Not Asked    Special Diet Not Asked    Sunscreen used Not Asked    Uses Cane Not Asked    Uses walker Not Asked    Uses wheelchair Not Asked    Right hand dominant Not Asked    Left hand dominant Not Asked    Ambidextrous Not Asked    Shift Work Not Asked    Unusual Sleep-Wake Schedule Not Asked    Ability to Walk 1 Flight of Steps without SOB/CP Yes    Routine Exercise No    Ability to Walk 2 Flight of Steps without SOB/CP No     Comment: SOB    Unable to Ambulate Not Asked    Total Care Not Asked    Ability To Do Own ADL's Yes    Uses Walker Not Asked    Other Activity Level Not Asked    Uses Cane Not Asked   Social History Narrative    1 child deceased, 1 living     Social Determinants of Health     Financial Resource Strain: Not on file   Transportation Needs: Not on file   Social Connections: Low Risk  (03/21/2023)    Social Connections     SDOH Social Isolation: 5 or more times a week   Intimate Partner Violence: Not on file   Housing Stability: Not on file         Objective:   Vital Signs:  BP (!) 126/52   Pulse 99   Temp (!) 35.9 C (96.7 F)   Ht 1.676 m (5' 6)   Wt (!) 143 kg (314 lb 9.5 oz)   LMP 09/07/2009   BMI 50.78 kg/m       Constitutional  General appearance: Normal  Eyes: Ophthalmic exam of optic discs and posterior segments: Normal  Cardiovascular:   Peripheral vascular system: Normal  HEENT:    HEENT:  Normal  Musculoskeletal  Gait  and Station: : Normal  Muscle strength (upper extremities): : Normal  Muscle strength (lower extremities): : Normal  Muscle tone (upper extremities): : Normal  Muscle tone (lower extremities): : Normal  Sensation: Normal  Coordination: Normal  Neurological  Orientation: Normal  Recent and remote memory: Normal  Attention span and concentration: Normal  Language: Normal  Fund of knowledge: Normal    Cranial Nerves  2nd: Normal  3rd,4th,6th: Normal  5th:  Normal  7th: Normal  8th: Abnormal: decreased right ear  9th: Normal  11th: Normal  12th: Normal    Data reviewed    MRI IAC w/wo 12/09/2023  FINDINGS: An enhancing mass filling the right internal auditory canal and extending into the cerebellopontine angle cistern, which measures approximately 2.2 x 1.2 x 1.0 cm is further increased in size since prior exam from 04/24/2023, when it measured approximately 2.0 x 1.0 x 0.9 cm. The lesion is more noticeably enlarged compared to the MRI exam performed 10/27/2021, when it measured approximately 1.7 x 0.7 x 0.6 cm. A cystic component within the medial aspect of the lesion has also increased in size. Mild chronic blood products are seen associated with the lesion. The mass abuts and mildly displaces the right middle cerebellar peduncle. There is no parenchymal edema.     There is no acute infarct or extra-axial fluid collection. No acute intracranial hemorrhage. A chronic microhemorrhage along the lateral left cerebellum is unchanged. Few scattered punctate deep and subcortical white matter foci of T2/FLAIR hyperintensity are nonspecific. There is no hydrocephalus. The pituitary gland is not enlarged. Cerebellar tonsils protrude approximately 8.5 mm below the foramen magnum.      Major intracranial vascular flow-voids are intact.      Minimal right mastoid fluid is nonspecific.      IMPRESSION:  1. Right vestibular schwannoma has increased in size compared to previous studies and now measures up to 2.2 cm.  2. Chiari type I  malformation.    Imaging reviewed by Dr. Lucien    Discussions with other providers:   Reviewed chart notes    Assessment:      ICD-10-CM    1. Vestibular schwannoma (CMS HCC)  D33.3       2. Vertigo  R42       3. Nausea  R11.0            Recommendations:    -The natural history, film findings, and indications for treatment were discussed.  -Patient consented for GK   -RTC as determined after the procedure.   -The patient has been advised to follow up with their PCP in regards any chronic medical conditions and any non-neurosurgical symptoms that they may have   -Continue Medical Management (Diet, Exercise, Medication).      Oni Dietzman, FNP-C    The patient was seen as a shared visit with the co-signing faculty.          [1]   Allergies  Allergen Reactions    Bee Venom Protein (Honey Bee) Anaphylaxis    Hymenoptera Allergenic Extract      Other reaction(s): UNKNOWN    Penicillins Hives/ Urticaria and Rash     Other reaction(s): Rash, Skin Irritation  Rash and swellling      Sulfa (Sulfonamides) Rash and Hives/ Urticaria     Other Reaction(s): Rash-Raised    Rash and swelling    Amoxicillin Rash

## 2024-01-25 NOTE — Progress Notes (Signed)
 I personally saw and evaluated the patient. See mid-level's note for additional details. My findings/participation are : pt has a small chiari 1 with open fluid spaces at the FM. She was also found to have a right vestibular schwannoma. No useful hearing in right ear per patient. Referred to us  for GKR. Risks, benefits, indications, options and procedures were discussed and consent was obtained.      Ripley Shark, MD

## 2024-01-27 ENCOUNTER — Encounter (HOSPITAL_COMMUNITY): Payer: Self-pay

## 2024-01-27 ENCOUNTER — Ambulatory Visit (HOSPITAL_COMMUNITY): Admission: RE | Admit: 2024-01-27 | Source: Ambulatory Visit

## 2024-01-27 NOTE — Progress Notes (Signed)
Encounter opened in error- SSeaman

## 2024-02-08 ENCOUNTER — Other Ambulatory Visit: Payer: Self-pay

## 2024-02-08 ENCOUNTER — Ambulatory Visit
Admission: RE | Admit: 2024-02-08 | Discharge: 2024-02-08 | Disposition: A | Discharge status: Still In Hospital | Source: Ambulatory Visit

## 2024-02-08 DIAGNOSIS — D333 Benign neoplasm of cranial nerves: Secondary | ICD-10-CM

## 2024-02-08 NOTE — Cancer Center Note (Signed)
 RADIATION THERAPY, [facility]  [address]  [place] [address]  Operated by [facility]  Video Visit     Name: Caitlyn Gomez  MRN: [id number]    Date: 02/08/2024  DOB: Nov 17, 1975 (48 y.o. )                           Patient's location: Home - [place] [address]   Patient/family aware of provider location: Yes  Patient/family consent for video visit: Yes  Interview and observation performed by: Alm Haley, MD    Chief Complaint: Right sided acoustic neuroma    History of Present Illness:  Caitlyn Gomez is a 48 y.o. female who was referred today for consideration of the role of radiation therapy in the management of a right-sided acoustic neuroma.  Her diagnosis was originally established in January of 2023 and has been associated with tinnitus as well as right-sided hearing loss.  Additionally she has been having increased dizziness as well as 1 episode of a syncopal event.  She has intermittent numbness and tingling along the right side of her face as well as described facial weakness.  She has been treated with steroids as well as Lyrica with minimal improvement.  On imaging, an MRI demonstrates a right-sided acoustic lesion which has increased in size over her serial scans.  We are asked to discuss the potential role of radiation therapy.    Past Medical History:  She has a past medical history of A-fib, Abnormal Pap smear, Anxiety, Arthropathy, Asthma, Blastoma (CMS HCC), Brain tumor (benign), Cervical myelopathy (CMS HCC), Chiari I malformation (CMS HCC) (2021), Chronic obstructive airway disease, Complex regional pain syndrome I, Coronary artery disease, Depression, Depression (10/05/2021), Dizziness, Dysplasia of cervix, Dysrhythmias, Ectopic pregnancy, Fibromyalgia, GERD (gastroesophageal reflux disease), History of anesthesia complications, History of cervical cancer, HTN (hypertension), Hyperlipidemia, NAFLD (nonalcoholic fatty liver disease), Neck mass, PCOS  (polycystic ovarian syndrome), Radiculopathy, Smoking addiction, Stroke, Thyroid  disorder, Thyroid  nodule, and Varicosities.    She has no past medical history of Angina pectoris, Clotting disorder (CMS HCC), Deep vein thrombosis (DVT), Headache(784.0), Heart disease, Heart murmur, Hepatic cirrhosis, breast cancer, Infertility female, Malignant hyperthermia, MI (myocardial infarction), Pacemaker, Painful intercourse, PONV (postoperative nausea and vomiting), Pseudocholinesterase deficiency, Pulmonary embolism, Rectal cancer (CMS HCC), Seizure (CMS HCC), Sleep apnea, STD (sexually transmitted disease), Thyroid  disease, Type 2 diabetes mellitus, Type I diabetes mellitus, Unspecified breast disorder, Unspecified disorder of thyroid , Upper respiratory infection, Viral hepatitis, Viral hepatitis B, or Viral hepatitis C.    Past Surgical History:  She has a past surgical history that includes hx tubal ligation; hx tonsillectomy; hx dilation and curettage; hx endometrial ablation; hx hysterectomy; hx appendectomy; hx heart catheterization; Cystoscopy (11/04/2022); and Bladder surgery (2023).    Problem List:  She has S/P endometrial ablation; Chiari malformation type I (CMS HCC); Acute exacerbation of chronic obstructive pulmonary disease (COPD) (CMS HCC); GERD (gastroesophageal reflux disease); HTN (hypertension); Depression; Chronic obstructive airway disease; Smoking addiction; Difficulty urinating; Diverticulitis; Diverticulitis of colon with perforation; and Cervicalgia on their problem list.    Medications:    Anoro Ellipta     apixaban     atorvastatin     HYDROcodone -acetaminophen     ipratropium-albuteroL     meclizine     Methylprednisolone     metoprolol  succinate    nitroGLYCERIN     omeprazole     ondansetron     pregabalin    ProAir  HFA     Review of Systems:  As stated above; otherwise, negative    Observational Exam:   Well-appearing, no acute distress    Data Reviewed:   MRI was reviewed demonstrating the  right-sided acoustic neuroma    Assessment/Plan:  Today, we discussed the role of radiation therapy in the management of her recently identified right-sided acoustic neuroma.  Based on the interval growth over time as well as her hearing loss we discussed a treatment approach utilizing stereotactic radiosurgery.  We discussed this in relationship to other alternatives including fractionated treatment versus that of surgery.  Additionally we discussed expected outcomes including the expectation for stabilization of the of the disease with limited effect on adjacent nerves.  It is unlikely based on the location that we will benefit her in terms of regaining her hearing which she understood.  She would like to proceed with stereotactic treatment and provided verbal consent.    Alm Haley, MD

## 2024-02-08 NOTE — Nurses Notes (Signed)
 New [facility] Radiosurgery patient seen via MyChart Video Visit with Dr. Cloyd to discuss treatment for R. Vest. Schwannoma. Name and DOB verified. Allergies reviewed. Patient instructed to watch video [facility] Knife from a patients perspective' and was given pre and post procedural instructions including, but not limited to: date(Aug 6) and arrival time (0700) for treatment, no solid food 8 hours prior to arrival, may have clear liquids up to 2 hours prior to arrival, take all medications as prescribed unless otherwise instructed, shower/wash hair prior to procedure. Caitlyn Gomez has long hair and is relieved to know she does not need to cut her hair. I instructed her to braid her hair in two braids over her ears- she verbalized understanding. She was instructed to have responsible adult for transportation. Questions encouraged and answered to patient's satisfaction. Education handout and department phone number provided via snail mail. MRI Screening Form and Radiation Oncology consent with Dr. Cloyd completed. Appt with Dr. Lucien was completed on 7.8.25. All pertinent personnel, departments and insurance specialists notified. Patient verbalized understanding of information provided.    Hx IV Contrast Allergy: No  Provider Discussed Contrast Allergy & Plan with Pt: N/A  Contrast Reaction Plan: N/A  Greig Peru, RN

## 2024-02-10 ENCOUNTER — Encounter (HOSPITAL_COMMUNITY): Payer: Self-pay

## 2024-02-14 ENCOUNTER — Other Ambulatory Visit (HOSPITAL_COMMUNITY): Payer: Self-pay

## 2024-02-14 ENCOUNTER — Inpatient Hospital Stay (HOSPITAL_COMMUNITY)
Admission: RE | Admit: 2024-02-14 | Discharge: 2024-02-14 | Disposition: A | Discharge status: Home / Self Care | Source: Ambulatory Visit

## 2024-02-14 ENCOUNTER — Encounter (HOSPITAL_COMMUNITY): Payer: Self-pay

## 2024-02-14 DIAGNOSIS — D332 Benign neoplasm of brain, unspecified: Secondary | ICD-10-CM

## 2024-02-14 DIAGNOSIS — F17200 Nicotine dependence, unspecified, uncomplicated: Secondary | ICD-10-CM

## 2024-02-14 HISTORY — DX: Transient cerebral ischemic attack, unspecified: G45.9

## 2024-02-14 HISTORY — DX: Dermatitis, unspecified: L30.9

## 2024-02-17 ENCOUNTER — Encounter (HOSPITAL_BASED_OUTPATIENT_CLINIC_OR_DEPARTMENT_OTHER): Payer: Self-pay | Admitting: NURSE PRACTITIONER

## 2024-02-17 ENCOUNTER — Ambulatory Visit: Attending: NURSE PRACTITIONER | Admitting: NURSE PRACTITIONER

## 2024-02-17 ENCOUNTER — Other Ambulatory Visit: Payer: Self-pay

## 2024-02-17 DIAGNOSIS — Z716 Tobacco abuse counseling: Secondary | ICD-10-CM

## 2024-02-17 DIAGNOSIS — F17200 Nicotine dependence, unspecified, uncomplicated: Secondary | ICD-10-CM

## 2024-02-17 DIAGNOSIS — F17210 Nicotine dependence, cigarettes, uncomplicated: Secondary | ICD-10-CM

## 2024-02-17 MED ORDER — NICOTINE 7 MG/24 HR DAILY TRANSDERMAL PATCH: 7.0000 mg | MEDICATED_PATCH | Freq: Every day | TRANSDERMAL | 0 refills | Status: DC

## 2024-02-17 MED ORDER — NICOTINE (POLACRILEX) 4 MG BUCCAL LOZENGE: 4.0000 mg | LOZENGE | BUCCAL | 3 refills | Status: DC | PRN

## 2024-02-17 MED ORDER — NICOTINE 14 MG/24 HR DAILY TRANSDERMAL PATCH: 14.0000 mg | MEDICATED_PATCH | Freq: Every day | TRANSDERMAL | 0 refills | Status: DC

## 2024-02-17 MED ORDER — NICOTINE 21 MG/24 HR DAILY TRANSDERMAL PATCH
21.0000 mg | MEDICATED_PATCH | Freq: Every day | TRANSDERMAL | 0 refills | Status: DC
Start: 2024-02-17 — End: 2024-03-22
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
## 2024-02-21 ENCOUNTER — Encounter (HOSPITAL_COMMUNITY): Payer: Self-pay

## 2024-02-22 ENCOUNTER — Telehealth (HOSPITAL_COMMUNITY): Payer: Self-pay

## 2024-02-23 ENCOUNTER — Ambulatory Visit (HOSPITAL_COMMUNITY)
Admission: RE | Admit: 2024-02-23 | Discharge: 2024-02-23 | Disposition: A | Discharge status: Still In Hospital | Source: Ambulatory Visit | Admitting: RADIATION ONCOLOGY

## 2024-02-23 ENCOUNTER — Ambulatory Visit (HOSPITAL_COMMUNITY): Payer: Self-pay

## 2024-02-23 ENCOUNTER — Ambulatory Visit
Admission: RE | Admit: 2024-02-23 | Discharge: 2024-02-23 | Disposition: A | Discharge status: Still In Hospital | Source: Ambulatory Visit

## 2024-02-23 ENCOUNTER — Ambulatory Visit (HOSPITAL_COMMUNITY): Admitting: Certified Registered"

## 2024-02-23 ENCOUNTER — Encounter (HOSPITAL_COMMUNITY): Payer: Self-pay | Admitting: Anesthesiology

## 2024-02-23 ENCOUNTER — Other Ambulatory Visit: Payer: Self-pay

## 2024-02-23 ENCOUNTER — Encounter (HOSPITAL_COMMUNITY): Payer: Self-pay | Admitting: RADIATION ONCOLOGY

## 2024-02-23 ENCOUNTER — Ambulatory Visit (HOSPITAL_COMMUNITY): Admitting: Anesthesiology

## 2024-02-23 ENCOUNTER — Ambulatory Visit
Admission: RE | Admit: 2024-02-23 | Discharge: 2024-02-23 | Disposition: A | Discharge status: Home / Self Care | Source: Ambulatory Visit | Attending: Anesthesiology | Admitting: Anesthesiology

## 2024-02-23 ENCOUNTER — Encounter (HOSPITAL_COMMUNITY)
Admission: RE | Disposition: A | Discharge status: Home / Self Care | Payer: Self-pay | Source: Ambulatory Visit | Attending: Anesthesiology

## 2024-02-23 DIAGNOSIS — G935 Compression of brain: Secondary | ICD-10-CM

## 2024-02-23 DIAGNOSIS — D332 Benign neoplasm of brain, unspecified: Secondary | ICD-10-CM

## 2024-02-23 DIAGNOSIS — D333 Benign neoplasm of cranial nerves: Secondary | ICD-10-CM

## 2024-02-23 SURGERY — APPLICATION LEKSELL FRAME FOR GAMMA KNIFE
Anesthesia: Monitor Anesthesia Care | Wound class: Clean Wound: Uninfected operative wounds in which no inflammation occurred

## 2024-02-23 MED ORDER — SODIUM CHLORIDE 0.9 % (FLUSH) INJECTION SYRINGE
2.0000 mL | INJECTION | INTRAMUSCULAR | Status: DC | PRN
Start: 2024-02-23 — End: 2024-02-23

## 2024-02-23 MED ORDER — SODIUM CHLORIDE 0.9 % INTRAVENOUS SOLUTION
INTRAVENOUS | Status: DC | PRN
Start: 2024-02-23 — End: 2024-02-23
  Administered 2024-02-23: 0 via INTRAVENOUS

## 2024-02-23 MED ORDER — SODIUM CHLORIDE 0.9 % (FLUSH) INJECTION SYRINGE
2.0000 mL | INJECTION | Freq: Three times a day (TID) | INTRAMUSCULAR | Status: DC
Start: 2024-02-23 — End: 2024-02-23

## 2024-02-23 MED ORDER — MIDAZOLAM (PF) 1 MG/ML INJECTION SOLUTION
Freq: Once | INTRAMUSCULAR | Status: DC | PRN
Start: 2024-02-23 — End: 2024-02-23
  Administered 2024-02-23: 2 mg via INTRAVENOUS

## 2024-02-23 MED ORDER — ONDANSETRON HCL (PF) 4 MG/2 ML INJECTION SOLUTION
4.0000 mg | Freq: Once | INTRAMUSCULAR | Status: DC | PRN
Start: 2024-02-23 — End: 2024-02-24

## 2024-02-23 MED ORDER — OXYCODONE-ACETAMINOPHEN 5 MG-325 MG TABLET
ORAL_TABLET | ORAL | Status: AC
Start: 2024-02-23 — End: 2024-02-23
  Filled 2024-02-23: qty 1

## 2024-02-23 MED ORDER — SODIUM CHLORIDE 0.9% FLUSH BAG - 250 ML
INTRAVENOUS | Status: DC | PRN
Start: 2024-02-23 — End: 2024-02-23

## 2024-02-23 MED ORDER — ACETAMINOPHEN 325 MG TABLET
650.0000 mg | ORAL_TABLET | ORAL | Status: DC | PRN
Start: 2024-02-23 — End: 2024-02-24

## 2024-02-23 MED ORDER — BACITRACIN 500 UNIT/GRAM TOPICAL PACKET
PACK | CUTANEOUS | Status: AC
Start: 2024-02-23 — End: 2024-02-23
  Filled 2024-02-23: qty 4

## 2024-02-23 MED ORDER — DEXMEDETOMIDINE 4 MCG/ML IV DILUTION
INTRAMUSCULAR | Status: AC
Start: 2024-02-23 — End: 2024-02-23
  Filled 2024-02-23: qty 10

## 2024-02-23 MED ORDER — MIDAZOLAM 1 MG/ML INJECTION SOLUTION
INTRAMUSCULAR | Status: AC
Start: 2024-02-23 — End: 2024-02-23
  Filled 2024-02-23: qty 2

## 2024-02-23 MED ORDER — LIDOCAINE 0.5%/SODIUM BICARB 0.84% IN NS INJECTION
30.0000 mL | Freq: Once | Status: DC | PRN
Start: 2024-02-23 — End: 2024-02-23
  Administered 2024-02-23: 24 mL via INTRADERMAL
  Filled 2024-02-23: qty 40

## 2024-02-23 MED ORDER — OXYCODONE-ACETAMINOPHEN 5 MG-325 MG TABLET
1.0000 | ORAL_TABLET | ORAL | Status: DC | PRN
Start: 2024-02-23 — End: 2024-02-24
  Administered 2024-02-23: 1 via ORAL

## 2024-02-23 MED ORDER — BACITRACIN 500 UNIT/GRAM TOPICAL PACKET
1.0000 | PACK | Freq: Once | CUTANEOUS | Status: AC | PRN
Start: 2024-02-23 — End: 2024-02-23
  Administered 2024-02-23: 4 via TOPICAL

## 2024-02-23 MED ORDER — GADOBUTROL 15 MMOL/15 ML (1 MMOL/ML) INTRAVENOUS SOLUTION
14.0000 mL | INTRAVENOUS | Status: AC
Start: 2024-02-23 — End: 2024-02-23
  Administered 2024-02-23: 14 mL via INTRAVENOUS

## 2024-02-23 MED ORDER — DIAZEPAM 5 MG/ML INJECTION SYRINGE
2.5000 mg | INJECTION | INTRAMUSCULAR | Status: DC
Start: 2024-02-23 — End: 2024-02-23

## 2024-02-23 MED ORDER — LIDOCAINE 4 % TOPICAL CREAM
TOPICAL_CREAM | Freq: Once | CUTANEOUS | Status: AC
Start: 2024-02-23 — End: 2024-02-23
  Filled 2024-02-23 (×2): qty 5

## 2024-02-23 MED ORDER — LACTATED RINGERS INTRAVENOUS SOLUTION
INTRAVENOUS | Status: DC
Start: 2024-02-23 — End: 2024-02-23

## 2024-02-23 MED ORDER — LIDOCAINE 0.5%/SODIUM BICARB 0.84% IN NS INJECTION
Status: AC
Start: 2024-02-23 — End: 2024-02-23
  Filled 2024-02-23: qty 40

## 2024-02-23 MED ORDER — DEXTROSE 5% IN WATER (D5W) FLUSH BAG - 250 ML
INTRAVENOUS | Status: DC | PRN
Start: 2024-02-23 — End: 2024-02-23

## 2024-02-23 MED ORDER — IBUPROFEN 400 MG TABLET
400.0000 mg | ORAL_TABLET | Freq: Once | ORAL | Status: DC | PRN
Start: 2024-02-23 — End: 2024-02-24
  Filled 2024-02-23: qty 1

## 2024-02-23 SURGICAL SUPPLY — 5 items
MARKER SKIN PREP RST RLR LBL REG TIP STRL (MED SURG SUPPLIES) ×1 IMPLANT
NEEDLE SPINAL BLK 3.5IN 22GA QUINCKE REG WL POLYPROP QUINCKE TIP STRL LF  DISP (MED SURG SUPPLIES) ×4 IMPLANT
SMOKE PENCIL WITH EDGE ELECTRODE 15 FT (MED SURG SUPPLIES) ×1 IMPLANT
SPONGE GAUZE 4X4IN MDCHC COTTON 12 PLY TY 7 LF  STRL DISP (WOUND CARE SUPPLY) ×4 IMPLANT
SYRINGE LL 10ML LF  STRL CONTROL CONCEN TIP PRGN FREE DEHP-FR MED DISP (MED SURG SUPPLIES) ×4 IMPLANT

## 2024-02-23 NOTE — Procedures (Signed)
 [facility]  [employer]  Gamma Knife Procedure Note    Patient Name: Caitlyn Gomez  Medical Record #: [id number]  Date of Birth: 14-Sep-1975  Date of Service: 02/23/2024    Principal Diagnosis: Right vestibular schwannoma  PostOp Diagnosis: Same    Procedure Performed:   1) Gamma knife stereotactic radiosurgery    Neurosurgeon: Ripley Shark, MD  Radiation Oncologist: Marolyn Corporal, MD  Physicist: Fonda Peace, MS    HPI: Caitlyn Gomez is a 48 y.o., White female who presents with a right vestibular schwannoma.    Operative Course: Stereotactic head frame was placed by Ripley Shark, MD.  A high resolution CT and a double contrast MRI were performed with a fiducial head box in place per gamma knife protocol.  The imaging data sets were transferred to the gamma plan workstation and fused to the registered CT. The MRI was reviewed with the neuroradiologist prior to planning.  Plan was performed, using standard shots of 4 and 8 mm collimators.  The final plan was reviewed by the radiation oncologist, medical physicist, and neurosurgeon.  All members of the treating team agreed that a treatment plan was developed to treat 1lesions as described below:       Prescription   Target Shots Rx Dose (Gy) Isodose Prescribed   [id number] 22 12.5 47%     Total number of shots 22  Total treatment time: 52.7 minutes     At the completion of the procedure, the patient was removed from the gamma knife unit and the head frame was removed by the neurosurgeon.  Bandages were placed over the pin sites.  The patient tolerated the treatment well and was transferred to recovery.  The plan is to discharge patient to home once recovered and follow up with Dr. Shark in 6 months.  The patient was reminded to contact myself, our colleagues who are on call, or go to the Emergency Department if she develops any concerning symptoms.  I was personally present for the entire active portion of the procedure.    Complications:  None  Dispostion: Stable for discharge      Marolyn Corporal, MD, MPH  Assistant Professor  Radiation Oncology

## 2024-02-23 NOTE — Anesthesia Postprocedure Evaluation (Signed)
 Anesthesia Post Op Evaluation    Patient: Caitlyn Gomez  Procedure(s):  APPLICATION LEKSELL FRAME FOR GAMMA KNIFE    Last Vitals:Temperature: 36 C (96.8 F) (02/23/24 0843)  Heart Rate: 71 (02/23/24 0843)  BP (Non-Invasive): 108/70 (02/23/24 0843)  Respiratory Rate: 16 (02/23/24 0843)  SpO2: 97 % (02/23/24 0843)    No notable events documented.      Patient location during evaluation: PACU       Patient participation: complete - patient participated  Level of consciousness: awake and alert and responsive to verbal stimuli    Pain management: adequate  Airway patency: patent    Anesthetic complications: no  Cardiovascular status: acceptable  Respiratory status: acceptable  Hydration status: acceptable  Patient post-procedure temperature: Pt Normothermic   PONV Status: Absent

## 2024-02-23 NOTE — OR Surgeon (Signed)
 02/23/2024  GAMMA KNIFE PROCEDURE NOTE:  Caitlyn Gomez  [id number]  April 29, 1976  PREOPERATIVE DIAGNOSIS: Acoustic Neuroma  (right)  POSTOPERATIVE DIAGNOSIS: Same  NAMES OF PROCEDURES:  Placement of a Leksell stereotactic head frame.  Gamma Knife Radiosurgery for Acoustic Neuroma  SURGEON:  Ripley Shark, M.D.  RADIATION ONCOLOGIST:      DELENA Corporal, MD.            RADIATION PHYSICIST:  Fonda Peace, M.Sc.                            Surgical Anemnesis(Indication):    This is a 48 y.o., white female with hearing loss and a right sided vestibular schwannoma. Referred to us  for GKR. Risks, benefits, indications, options and procedures were discussed and consent was obtained.      DESCRIPTION OF PROCEDURE:     Procedural Sedation:Patient was brought to the OR by the anesthesia team and underwent monitored anesthesia care.    After informed consent and sedation, the patient's head was sterilized with alcohol.  A Leksell G stereotactic head frame was positioned over the head and fixed to the skull with 2 frontal and 2 occipital pins after infiltration of the scalp with lidocaine .  The patient then had a volumetric CT and high dose MRI  performed with the fiducial head box in place.  These imaging data sets were transferred to the GammaPlan workstation, and fused to the registered CT.      A plan was created, composed of 4 and 8 mm shots covering the tumor in a conformal manner.    R vestibular schwannoma : 1.650 cc     The final plan was reviewed by the radiation oncologist and radiation physicist.  All members of the treating team agreed that the tumor received a dose of 12.5 Gy to the 47% isodose line.      The patient was positioned on the gamma knife unit, and the treatment was delivered without complications.  At the conclusion of the treatment, the patient was removed from the gamma knife unit.  The head frame was removed, and bandages placed over the 2 frontal pin sites.  The patient will have a followup in 6  months.    Pt was discharged home in a satisfactory condition.        Ripley Shark, MD  Assoc Professor NS  [facility] Department of Neurosurgery  [place], MDSB8/12/2023

## 2024-02-23 NOTE — Anesthesia Preprocedure Evaluation (Signed)
 ANESTHESIA PRE-OP EVALUATION  Planned Procedure: APPLICATION LEKSELL FRAME FOR GAMMA KNIFE  Review of Systems     anesthesia history negative               Pulmonary   COPD, asthma and shortness of breath,   Cardiovascular    Hypertension, CAD, dysrhythmias, atrial fibrillation and hyperlipidemia , Exercise Tolerance: > or = 4 METS        GI/Hepatic/Renal    GERD and liver disease        Endo/Other    diverticulitis,      Neuro/Psych/MS    fibromyalgia, anxiety, depression     Cancer  CA,                       Physical Assessment      Airway       Mallampati: III    TM distance: 3 FB    Neck ROM: full  Mouth Opening: good.  No Facial hair  No Beard        Dental           (+) edentulous           Pulmonary    Breath sounds clear to auscultation       Cardiovascular    Rhythm: regular  Rate: Normal       Other findings              Plan  ASA 3     Planned anesthesia type: MAC                         Intravenous induction     Anesthesia issues/risks discussed are: Dental Injuries, Nerve Injuries, PONV, Eye /Visual Loss, Post-op Pain Management, Post-op Cognitive Dysfunction, Cardiac Events/MI, Post-op Agitation/Tantrum, Stroke, Intraoperative Awareness/ Recall, Blood Loss, Aspiration, Difficult Airway and Sore Throat.  Anesthetic plan and risks discussed with patient  signed consent obtained      Use of blood products discussed with patient who consented to blood products.      Patient's NPO status is appropriate for Anesthesia.           Plan discussed with CRNA.

## 2024-02-23 NOTE — Nurses Notes (Signed)
 Patient arrived from OR for gamma knife MRI.    *VSS*  BP - 110/67  HR - 67  Temp - 98.6  O2 - 98% RA    Patient will proceed to go to radiation oncology after MRI for gamma knife procedure.    Harlene Abu, RN

## 2024-02-23 NOTE — Anesthesia Transfer of Care (Signed)
 ANESTHESIA TRANSFER OF CARE   Caitlyn Gomez is a 48 y.o. ,female, Weight: (!) 143 kg (315 lb 7.7 oz)   had Procedure(s):  APPLICATION LEKSELL FRAME FOR GAMMA KNIFE  performed  02/23/24   Primary Service: Ripley Shark, MD    Past Medical History:   Diagnosis Date    A-fib     Dr. CANDIE Galloway    Abnormal Pap smear     Anxiety     Arthropathy     Asthma     Blastoma (CMS HCC)     ears    Brain tumor (benign)     Cervical myelopathy (CMS HCC)     Chiari I malformation (CMS HCC) 2021    Chronic obstructive airway disease     Complex regional pain syndrome I     Coronary artery disease     Depression     Depression 10/05/2021    Dizziness     Dysplasia of cervix     Dysrhythmias     a fib    Ectopic pregnancy     Eczema     Fibromyalgia     GERD (gastroesophageal reflux disease)     controlled with med    History of anesthesia complications     aspirated for scope last time    History of cervical cancer     HTN (hypertension)     Hyperlipidemia     NAFLD (nonalcoholic fatty liver disease)     Neck mass     PCOS (polycystic ovarian syndrome)     Radiculopathy     Shortness of breath     Smoking addiction     Stroke     TIA- [facility] - 2016 - (left facial droop and left arm weakness)    Thyroid  disorder     nodule    Thyroid  nodule     TIA (transient ischemic attack)     Varicosities       Allergy History as of 02/23/24       SULFA (SULFONAMIDES)         Noted Status Severity Type Reaction    03/16/23 2108 Moishe Loader, RN 01/24/10 Active High  Rash, Hives/ Urticaria    Comments: Other Reaction(s): Rash-Raised    Rash and swelling     11/04/22 0704 BosticAldona, RN 01/24/10 Active Medium  Rash    01/24/10 0300 Haddix, Natalie K 01/24/10 Active                 AMOXICILLIN         Noted Status Severity Type Reaction    11/04/22 0704 BosticAldona, RN 01/24/10 Active Medium  Rash    01/24/10 0301 Haddix, Natalie K 01/24/10 Active                 PENICILLINS         Noted Status Severity Type Reaction    10/14/21 0840 Smalls,  [place], LPN 93/84/83 Active High  Hives/ Urticaria, Rash    Comments: Other reaction(s): Rash, Skin Irritation  Rash and swellling                 MORPHINE          Noted Status Severity Type Reaction    11/04/22 1102 Sonda Greener, RN 01/02/15 Deleted       Comments: Other reaction(s): ARM SWELLED UPON INJECTION, Rash     10/14/21 0840 Rendall Holly, LPN 93/84/83 Active       Comments:  Other reaction(s): ARM SWELLED UPON INJECTION, Rash               HYMENOPTERA ALLERGENIC EXTRACT         Noted Status Severity Type Reaction    10/14/21 0840 Rendall Holly, LPN 89/71/79 Active High      Comments: Other reaction(s): UNKNOWN               BEE VENOM PROTEIN (HONEY BEE)         Noted Status Severity Type Reaction    11/04/22 0706 Elgin Georgi, RN 11/04/22 Active High  Anaphylaxis                  I completed my transfer of care / handoff to the receiving personnel during which we discussed:  Analgesia, All key/critical aspects of case discussed and PMHx                              Additional Info:To MRI. Report to RN>                                     Last OR Temp: Temperature: 36 C (96.8 F)  ABG:  POTASSIUM   Date Value Ref Range Status   12/17/2023 4.3 3.5 - 5.1 mmol/L Final   01/24/2010 3.3 (L) 3.5 - 5.1 mmol/L Final     KETONES   Date Value Ref Range Status   10/01/2023 Negative Negative, Trace mg/dL Final     CALCIUM   Date Value Ref Range Status   12/17/2023 9.1 8.6 - 10.3 mg/dL Final   92/91/7988 8.6 8.5 - 10.4 mg/dL Final     Calculated P Axis   Date Value Ref Range Status   04/21/2023 45 degrees Final     Calculated R Axis   Date Value Ref Range Status   04/21/2023 19 degrees Final     Calculated T Axis   Date Value Ref Range Status   04/21/2023 16 degrees Final     Airway:* No LDAs found *  Blood pressure 108/70, pulse 71, temperature 36 C (96.8 F), resp. rate 16, height 1.676 m (5' 6), weight (!) 143 kg (315 lb 7.7 oz), last menstrual period 09/07/2009, SpO2 97%.

## 2024-02-23 NOTE — Discharge Instructions (Signed)
 Radiation Oncology [facility] Radiosurgery  Discharge Instructions Following Treatment          You may resume medications, diet, and treatments after the procedure. Any needed changes in medication will be discussed at discharge          You may experience side effects, but they are often mild. Headache, dizziness, drowsiness or nausea may be present but will disappear soon after the procedure          Rarely brain swelling and/or seizures may occur- the severity of these symptoms may require a visit to the nearest emergency department  After Gamma Knife          Avoid wigs or tight headbands for the next few days          Apply ice to pin sites and take over-the-counter pain medication as needed (Tylenol  or Ibuprofen )          You may shower and wash your hair tomorrow- gently cleanse pin sites starting the day after your surgery with soap and water , apply triple antibiotic ointment and Band-Aid          Do not drive for the next 75-51 hours          Any follow up MRI/imaging and clinic appointments will be scheduled for you     If any of the following occur go to the nearest Emergency Room and inform staff you received Gamma Knife Radiosurgery at [facility]          Fever over 101 degrees Fahrenheit          Unrelieved head pain          Changes in balance, vision, or mental status          Loss of function in arms or legs     Please call with any additional questions/concerns [phone] or [phone].  For issues after hours please call [phone]. Inform staff you received Gamma Knife Radiosurgery and ask for the Neurosurgeon or Radiation Oncologist on call.

## 2024-02-23 NOTE — Nurses Notes (Signed)
 0900 Contacted family member, sister Caitlyn Gomez and boyfriend Caitlyn Gomez, to provide update of patient's status (frame application is completed and patient is currently in MRI department).  Advised additional updates would be provided as they are available. Caitlyn Gomez verbalized understanding.     9075 Pt received in Radiation Oncology department s/p frame application for [facility] Radiosurgery and MRI brain as treatment for R. Vest. Schwannoma.  Pt taken to CT in radiation dept escorted by RN via transport cart. Patient name and DOB confirmed with patient.      9064 The pt returned to exam room via transport cart to await completion of treatment plan .  Restroom, drink and snack offered/provided to pt.  Call bell in reach, bed rails up and bed in lowest/locked position. Vital signs per flowsheet. Reports pain 6/10. PO percocet administered.    0950 Pt taken to GK area, treatment plan reviewed. Pulse ox probe placed on patient's finger to monitor heart rate and oxygen level during treatment. Universal Protocol for treatment completed by Dr.Bhatia, see flow sheet*. Safety measures reviewed. Pillows placed under knees, safety strap applied across pt, and side rails secured. Frame secured to treatment bed.      0957 Treatment initiated, beam on, approximate treatment time 55 minutes.    1030 Pt appears comfortable, pulse ox remains in place to monitor HR and O2 sats.  28 minutes until completion of treatment.    1057 Treatment concluded, pt removed from GK treatment table.    1108 GK frame removed by Dr. Severa, pin sites cleansed, bacitracin  ointment and band aid applied after hemostasis achieved.     1120 Vitals per flow sheet, pain 5-6/10, to soon for acetaminophen . Patient states she has been instructed to not use ibuprofen . Pt assessed for nausea- declines at this time. Caitlyn Gomez in the room with [place]. Caitlyn Gomez expressed concern for [place], and reminded her their deceased mom is watching over her. Blood pressure  elevated, cuff is on lower left arm. Patient expressing discomfort from the pin sites, including clenching her fist and sitting forward. This RN encouraged [place] to breath deeply and relax her arm which is impacting the BP. Repeat BP improved. PIV removed with tip intact, bandage in place. Discharge instructions and AVS given to patient and family member, provided opportunity for questions. Patient is to return to  clinic with Dr. Lucien in 3 months with an MRI brain :1 week prior- if completed at outside facility [facility]). Same day if available appt same day as follow up 11.11.25. Pt ready for discharge, pt agreeable & has no further questions/concerns.    1130 [facility] with friend waiting in vehicle outside.        Hx MRI Contrast Allergy? No    Premeds Rx'd for f/u imaging? No    Grecia Lynk, RN

## 2024-02-23 NOTE — H&P (Signed)
 [facility]  H&P Update Form    Caitlyn Gomez, Caitlyn Gomez, 48 y.o. female  Encounter Start Date:  (Not on file)  Inpatient Admission Date:   Date of Birth:  1975-11-17    02/23/2024    STOP: IF H&P IS GREATER THAN 30 DAYS FROM SURGICAL DAY COMPLETE NEW H&P IS REQUIRED.     H & P updated the day of the procedure.  1.  H&P completed within 30 days of surgical procedure and has been reviewed within 24 hours of admission but prior to surgery or a procedure requiring anesthesia services by Dr. Lucien on 01/25/24 , the patient has been examined, and no change has occured in the patients condition since the H&P was completed.       Change in medications: No          Last Menstrual Period: Not applicable      Comments:     2. COVID: N/A    3.  Patient continues to be appropiate candidate for planned surgical procedure. YES      Geni Gobble, MD  Neurosurgery PGY3

## 2024-03-22 ENCOUNTER — Other Ambulatory Visit (HOSPITAL_BASED_OUTPATIENT_CLINIC_OR_DEPARTMENT_OTHER): Payer: Self-pay | Admitting: NURSE PRACTITIONER

## 2024-03-22 ENCOUNTER — Other Ambulatory Visit (INDEPENDENT_AMBULATORY_CARE_PROVIDER_SITE_OTHER): Payer: Self-pay

## 2024-03-22 DIAGNOSIS — F17210 Nicotine dependence, cigarettes, uncomplicated: Secondary | ICD-10-CM

## 2024-04-03 ENCOUNTER — Ambulatory Visit
Admission: RE | Admit: 2024-04-03 | Discharge: 2024-04-03 | Disposition: A | Discharge status: Home / Self Care | Source: Ambulatory Visit | Attending: NURSE PRACTITIONER

## 2024-04-03 ENCOUNTER — Other Ambulatory Visit: Payer: Self-pay

## 2024-04-03 ENCOUNTER — Other Ambulatory Visit (HOSPITAL_COMMUNITY): Payer: Self-pay | Admitting: NURSE PRACTITIONER

## 2024-04-03 DIAGNOSIS — M79672 Pain in left foot: Secondary | ICD-10-CM | POA: Insufficient documentation

## 2024-04-04 ENCOUNTER — Other Ambulatory Visit (HOSPITAL_COMMUNITY): Payer: Self-pay | Admitting: NURSE PRACTITIONER

## 2024-04-04 DIAGNOSIS — Z1231 Encounter for screening mammogram for malignant neoplasm of breast: Secondary | ICD-10-CM

## 2024-04-05 ENCOUNTER — Ambulatory Visit: Admitting: NURSE PRACTITIONER

## 2024-04-11 ENCOUNTER — Encounter (HOSPITAL_COMMUNITY): Payer: Self-pay

## 2024-04-11 ENCOUNTER — Ambulatory Visit
Admission: RE | Admit: 2024-04-11 | Discharge: 2024-04-11 | Disposition: A | Discharge status: Home / Self Care | Source: Ambulatory Visit | Attending: NURSE PRACTITIONER | Admitting: NURSE PRACTITIONER

## 2024-04-11 DIAGNOSIS — Z1231 Encounter for screening mammogram for malignant neoplasm of breast: Secondary | ICD-10-CM | POA: Insufficient documentation

## 2024-05-22 ENCOUNTER — Encounter (HOSPITAL_COMMUNITY): Payer: Self-pay

## 2024-05-22 MED ORDER — DEXAMETHASONE 4 MG TABLET
4.0000 mg | ORAL_TABLET | Freq: Two times a day (BID) | ORAL | 0 refills | Status: AC
Start: 2024-05-22 — End: 2024-05-27

## 2024-05-22 NOTE — Telephone Encounter (Signed)
 [facility] Radiation Oncology  [facility]  Telephone Visit      Name: Dayrin Stallone  MRN: [id number]  DOB: December 20, 1975    Date of Service:  05/22/2024      The patient/family initiated a request for telephone service.  Verbal consent for this service was obtained from the patient/family.    Last office visit in this department: Visit date not found       Reason for call: Facial weakness  Call notes:    Diagnosis:   R sided vestibular schwannoma    Radiation History:   GKRS, delivered 02/23/24    Time since last treatment:  3 months     History of Present Illness     Patient reporting R sided facial weakness with muscle twitching that has been progressively more significant x 2 months. She also reports occipital headaches.     Assessment/Plan:    Elisabella Hacker is a pleasant 48 y.o. femalewith a PMH of R sided vestibular schwannoma s/p GKRS delivered 02/2024. She is having progressive facial weakness and muscle twitching over two months that is episodic. Clinical presentation not consistent with CVA. Most suspicious for radiation effects from treatment to vestibular schwannoma. Will treat with short course of steroids. If no  improvement in symptoms will discuss moving up patient's imaging of the head.         Total provider time spent with the patient on the phone: 10 minutes.    Thersia Rao, PA-C

## 2024-05-26 ENCOUNTER — Ambulatory Visit
Admission: RE | Admit: 2024-05-26 | Discharge: 2024-05-26 | Disposition: A | Discharge status: Still In Hospital | Source: Ambulatory Visit

## 2024-05-26 ENCOUNTER — Encounter (HOSPITAL_COMMUNITY): Payer: Self-pay

## 2024-05-26 DIAGNOSIS — Z923 Personal history of irradiation: Secondary | ICD-10-CM

## 2024-05-26 DIAGNOSIS — D333 Benign neoplasm of cranial nerves: Secondary | ICD-10-CM

## 2024-05-26 DIAGNOSIS — R253 Fasciculation: Secondary | ICD-10-CM

## 2024-05-26 NOTE — Progress Notes (Signed)
 [facility] Radiation Oncology  [facility]  MyChart Virtual Visit      Name: Caitlyn Gomez  MRN: [id number]  DOB: [birth year]    Date of Service:  05/26/2024    TELEMEDICINE DOCUMENTATION:  Patient Location:  MyChart video visit from home address: [address]  [place] [address]   Patient/family aware of provider location:  yes  Patient/family consent for telemedicine:  yes  Examination observed and performed by: Thersia Rao, PA-C      Last office visit in this department: Visit date not found       Reason for call: Follow-up on treatment toxicities  Call notes:    Diagnosis:   R sided vestibular schwannoma    Radiation History:   GKRS, delivered 02/23/24    Time since last treatment:  3 months     History of Present Illness     Caitlyn Gomez is a 48 y.o. female with a PMH of R sided vestibular schwannoma originally diagnosed in 07/2021 after presenting with tinnitus and hearing loss. Associated with her symptoms was R sided facial numbness and weakness. S/p GKRS delivered 02/2024. A video visit was completed today to follow-up on progressive unilateral facial twitching and weakness.     At today's visit patient reports minimal improvement in her symptoms with the initiation of steroids. She is very bothered and distressed by the facial twitching.     Physical Exam  Vitals reviewed.   Constitutional:       Appearance: Normal appearance.      Comments: KPS score 90.   HENT:      Head: Normocephalic.   Pulmonary:      Effort: Pulmonary effort is normal.   Skin:     General: Skin is warm and dry.   Neurological:      General: No focal deficit present.      Mental Status: She is alert and oriented to person, place, and time.      Comments: Intermittent R facial pulling and twitching observed   Psychiatric:         Mood and Affect: Mood normal.         Behavior: Behavior normal.           Assessment/Plan:    Caitlyn Gomez is a pleasant 48 y.o. femalewith a PMH of R sided vestibular  schwannoma s/p GKRS delivered 02/2024. Will obtain MRI of the head ASAP to evaluate progressive symptoms. Will follow up with patient once imaging is completed.     Total provider time spent with the patient: 7 minutes.    Thersia Rao, PA-C

## 2024-05-30 ENCOUNTER — Encounter (INDEPENDENT_AMBULATORY_CARE_PROVIDER_SITE_OTHER): Payer: Self-pay | Admitting: Anesthesiology

## 2024-06-23 ENCOUNTER — Encounter (HOSPITAL_COMMUNITY): Payer: Self-pay

## 2024-06-23 ENCOUNTER — Emergency Department (HOSPITAL_COMMUNITY)

## 2024-06-23 ENCOUNTER — Emergency Department
Admission: EM | Admit: 2024-06-23 | Discharge: 2024-06-23 | Disposition: A | Discharge status: Home / Self Care | Attending: Emergency Medicine | Admitting: Emergency Medicine

## 2024-06-23 ENCOUNTER — Other Ambulatory Visit: Payer: Self-pay

## 2024-06-23 DIAGNOSIS — Z923 Personal history of irradiation: Secondary | ICD-10-CM | POA: Insufficient documentation

## 2024-06-23 DIAGNOSIS — D333 Benign neoplasm of cranial nerves: Secondary | ICD-10-CM | POA: Insufficient documentation

## 2024-06-23 DIAGNOSIS — R519 Headache, unspecified: Secondary | ICD-10-CM | POA: Insufficient documentation

## 2024-06-23 DIAGNOSIS — Z79899 Other long term (current) drug therapy: Secondary | ICD-10-CM | POA: Insufficient documentation

## 2024-06-23 LAB — COMPREHENSIVE METABOLIC PANEL, NON-FASTING
ALBUMIN/GLOBULIN RATIO: 1.1 (ref 0.8–1.4)
ALBUMIN: 3.9 g/dL (ref 3.5–5.7)
ALKALINE PHOSPHATASE: 71 U/L (ref 34–104)
ALT (SGPT): 17 U/L (ref 7–52)
ANION GAP: 4 mmol/L (ref 4–13)
AST (SGOT): 15 U/L (ref 13–39)
BILIRUBIN TOTAL: 0.3 mg/dL (ref 0.3–1.0)
BUN/CREA RATIO: 12 (ref 6–22)
BUN: 10 mg/dL (ref 7–25)
CALCIUM, CORRECTED: 8.8 mg/dL — ABNORMAL LOW (ref 8.9–10.8)
CALCIUM: 8.7 mg/dL (ref 8.6–10.3)
CHLORIDE: 106 mmol/L (ref 98–107)
CO2 TOTAL: 28 mmol/L (ref 21–31)
CREATININE: 0.83 mg/dL (ref 0.60–1.30)
ESTIMATED GFR: 88 mL/min/1.73mˆ2 (ref >59)
GLOBULIN: 3.4 (ref 2.0–3.5)
GLUCOSE: 117 mg/dL — ABNORMAL HIGH (ref 74–109)
OSMOLALITY, CALCULATED: 276 mosm/kg (ref 270–290)
POTASSIUM: 3.9 mmol/L (ref 3.5–5.1)
PROTEIN TOTAL: 7.3 g/dL (ref 6.4–8.9)
SODIUM: 138 mmol/L (ref 136–145)

## 2024-06-23 LAB — CBC WITH DIFF
BASOPHIL #: 0.1 x10ˆ3/uL (ref 0.00–0.10)
BASOPHIL %: 1 % (ref 0–1)
EOSINOPHIL #: 0.1 x10ˆ3/uL (ref 0.00–0.50)
EOSINOPHIL %: 1 % (ref 1–7)
HCT: 41 % (ref 31.2–41.9)
HGB: 13.6 g/dL (ref 10.9–14.3)
LYMPHOCYTE #: 1.9 x10ˆ3/uL (ref 1.10–3.10)
LYMPHOCYTE %: 27 % (ref 16–46)
MCH: 28.7 pg (ref 24.7–32.8)
MCHC: 33.3 g/dL (ref 32.3–35.6)
MCV: 86.3 fL (ref 75.5–95.3)
MONOCYTE #: 0.6 x10ˆ3/uL (ref 0.20–0.90)
MONOCYTE %: 8 % (ref 4–11)
MPV: 8.8 fL (ref 7.9–10.8)
NEUTROPHIL #: 4.6 x10ˆ3/uL (ref 1.90–8.20)
NEUTROPHIL %: 63 % (ref 43–77)
PLATELETS: 266 x10ˆ3/uL (ref 140–440)
RBC: 4.74 x10ˆ6/uL (ref 3.63–4.92)
RDW: 13.6 % (ref 12.3–17.7)
WBC: 7.3 x10ˆ3/uL (ref 3.8–11.8)

## 2024-06-23 LAB — PT/INR
INR: 1.05 (ref 0.84–1.10)
PROTHROMBIN TIME: 11.9 s (ref 9.8–12.7)

## 2024-06-23 LAB — MAGNESIUM: MAGNESIUM: 1.8 mg/dL — ABNORMAL LOW (ref 1.9–2.7)

## 2024-06-23 MED ORDER — OXYCODONE-ACETAMINOPHEN 5 MG-325 MG TABLET
ORAL_TABLET | ORAL | Status: AC
  Filled 2024-06-23: qty 1

## 2024-06-23 MED ORDER — GADOBUTROL 10 MMOL/10 ML (1 MMOL/ML) INTRAVENOUS SOLUTION
10.0000 mL | INTRAVENOUS | Status: AC
  Administered 2024-06-23: 10 mL via INTRAVENOUS

## 2024-06-23 MED ORDER — MORPHINE 2 MG/ML INJECTION WRAPPER
2.0000 mg | INJECTION | INTRAMUSCULAR | Status: AC
  Administered 2024-06-23: 2 mg via INTRAVENOUS

## 2024-06-23 MED ORDER — OXYCODONE-ACETAMINOPHEN 5 MG-325 MG TABLET
1.0000 | ORAL_TABLET | ORAL | Status: AC
  Administered 2024-06-23: 1 via ORAL

## 2024-06-23 MED ORDER — ONDANSETRON HCL (PF) 4 MG/2 ML INJECTION SOLUTION
INTRAMUSCULAR | Status: AC
  Filled 2024-06-23: qty 2

## 2024-06-23 MED ORDER — MORPHINE 2 MG/ML INJECTION WRAPPER
INJECTION | INTRAMUSCULAR | Status: AC
  Filled 2024-06-23: qty 1

## 2024-06-23 MED ORDER — ONDANSETRON HCL (PF) 4 MG/2 ML INJECTION SOLUTION
4.0000 mg | INTRAMUSCULAR | Status: AC
  Administered 2024-06-23: 4 mg via INTRAVENOUS

## 2024-06-23 MED ORDER — HYDROCODONE 5 MG-ACETAMINOPHEN 325 MG TABLET: 1.0000 | ORAL_TABLET | Freq: Four times a day (QID) | ORAL | 0 refills | Status: DC | PRN

## 2024-06-23 NOTE — ED Provider Notes (Signed)
 Emergency Medicine    Name: Caitlyn Gomez  Age and Gender: 48 y.o. female  Date of Birth: 06-Nov-1975  MRN: [id number]  PCP: Ellouise Slocumb, APRN, CNP    CC:  Chief Complaint   Patient presents with    Neurologic Problem       HPI:  Caitlyn Gomez is a 48 y.o. White female with history of right facial pain, intermittent drooping/spasm of right side of mouth.    She has a right vestibular schwannoma and has undergone radiation, but has intermittent facial pain, spasms which are very distressing.    The pains seem to shoot across the right side of her face, and they come and go. She thinks it feels like a nerve pain.    She denies fever.       Pain Rating Scale     On a scale of 0-10, during the past 24 hours, pain has interfered with you usual activity:       On a scale of 0-10, during the past 24 hours, pain has interfered with your sleep:      On a scale of 0-10, during the past 24 hours, pain has affected your mood:       On a scale of 0-10, during the past 24 hours, pain has contributed to your stress:       On a scale of 0-10, what is your overall pain Rating: 7        Below pertinent information reviewed with patient:  Past Medical History:   Diagnosis Date    A-fib     Dr. GORMAN. Rana    Abnormal Pap smear     Anxiety     Arthropathy     Asthma     Blastoma (CMS HCC)     ears    Brain tumor (benign)     Cancer (CMS HCC)     Pt stated hx of tumors in her brain with surgery and gamma knife radiation    Cervical myelopathy (CMS HCC)     Chiari I malformation (CMS HCC) 2021    Chronic obstructive airway disease     Complex regional pain syndrome I     Coronary artery disease     Depression     Depression 10/05/2021    Dizziness     Dysplasia of cervix     Dysrhythmias     a fib    Ectopic pregnancy     Eczema     Fibromyalgia     GERD (gastroesophageal reflux disease)     controlled with med    History of anesthesia complications     aspirated for scope last time    History of cervical cancer     HTN  (hypertension)     Hyperlipidemia     NAFLD (nonalcoholic fatty liver disease)     Neck mass     PCOS (polycystic ovarian syndrome)     Radiculopathy     Shortness of breath     Smoking addiction     Stroke     TIA-  - 2016 - (left facial droop and left arm weakness)    Thyroid  disorder     nodule    Thyroid  nodule     TIA (transient ischemic attack)     Varicosities            Allergies[1]    Past Surgical History:   Procedure Laterality Date    BLADDER SURGERY  2023  mesh placed    CYSTOSCOPY  11/04/2022    cystoscopy, retropubic mid urethral sling    HX APPENDECTOMY      HX DILATION AND CURETTAGE      HX ENDOMETRIAL ABLATION      HX HEART CATHETERIZATION      stent    HX HYSTERECTOMY      HX SHOULDER SURGERY Left     ANCHOR    HX TONSILLECTOMY      HX TUBAL LIGATION             Social History        Objective:    ED Triage Vitals [06/23/24 1456]   BP (Non-Invasive) (!) 144/98   Heart Rate 96   Respiratory Rate 18   Temperature 36.3 C (97.3 F)   SpO2 98 %   Weight (!) 140 kg (309 lb)   Height 1.676 m (5' 6)     Filed Vitals:    06/23/24 1530 06/23/24 1545 06/23/24 1600 06/23/24 1615   BP: (!) 114/92 114/71 108/71 101/77   Pulse: 76 84 76 83   Resp: (!) 21 (!) 11 17 13    Temp:       SpO2: 98% 94% 99% 90%       Nursing notes and vital signs reviewed.    Constitutional - No acute distress.  Alert and Active.  HEENT - Normocephalic. Atraumatic. PERRL. EOMI. Conjunctiva clear. Oropharynx with no erythema, lesions, or exudates. Moist mucous membranes. Left TM normal, right with clear fluid behind drum.  Neck - Trachea midline. No stridor. No hoarseness.  Cardiac - Regular rate and rhythm. No murmurs, rubs, or gallops.  Respiratory - Clear to auscultation bilaterally. No rales, wheezes or rhonchi.  Abdomen - Non-tender, soft, non-distended. No rebound or guarding.   Musculoskeletal - Good AROM. No muscle or joint tenderness appreciated. No clubbing, cyanosis or edema.  Skin - Warm and dry, without any rashes or  other lesions.  Neuro - Cranial nerves II-XII are grossly intact. She has intermittent spasm of the right side of her mouth.  No facial redness/swelling.    Any pertinent labs and imaging obtained during this encounter reviewed below in MDM.    MDM/ED Course:    Ms. Hasten presented today for the troublesome facial twitch she is having. MRI was done and actually shows some improvement. I don't know what to do for her symptoms but I'll have her call the office of Dr. Cloyd, her radiation oncologist, whose office directed her here today. I wonder if this could be an equivalent of trigeminal neuralgia.    She is already taking Lyrica, so I am writing her a few hydrocodone .    I have messaged Dr. Cloyd on secure chat so that he will know that she came in and had the MRI.    Medical Decision Making  Amount and/or Complexity of Data Reviewed  Labs: ordered. Decision-making details documented in ED Course.  Radiology: ordered. Decision-making details documented in ED Course.    Risk  Prescription drug management.  Parenteral controlled substances.             Orders Placed This Encounter    CANCELED: CT BRAIN WO IV CONTRAST    CANCELED: MRI BRAIN W/WO CONTRAST    CANCELED: MRI IAC W/WO CONTRAST    MRI BRAIN AND IAC W/WO CONT    CBC/DIFF    COMPREHENSIVE METABOLIC PANEL, NON-FASTING    PT/INR    MAGNESIUM     CBC  WITH DIFF    EXTRA TUBES    GOLD TOP TUBE    LIGHT GREEN TOP TUBE    GRAY TOP TUBE    morphine  2 mg/mL injection    ondansetron  (ZOFRAN ) 2 mg/mL injection    gadobutrol  (GADAVIST ) 1 mmol/mL (10 mL) injection    oxyCODONE -acetaminophen  (PERCOCET) 5-325mg  per tablet         Impression:   Clinical Impression   Unilateral vestibular schwannoma (CMS HCC) (Primary)   Facial pain       Disposition: Discharged          Portions of this note may have been dictated using voice recognition software.     Aliene Silvius, MD  [facility]    -----------------------  Results for orders placed or performed during the  hospital encounter of 06/23/24 (from the past 12 hours)   COMPREHENSIVE METABOLIC PANEL, NON-FASTING   Result Value Ref Range    SODIUM 138 136 - 145 mmol/L    POTASSIUM 3.9 3.5 - 5.1 mmol/L    CHLORIDE 106 98 - 107 mmol/L    CO2 TOTAL 28 21 - 31 mmol/L    ANION GAP 4 4 - 13 mmol/L    BUN 10 7 - 25 mg/dL    CREATININE 9.16 9.39 - 1.30 mg/dL    BUN/CREA RATIO 12 6 - 22    ESTIMATED GFR 88 >59 mL/min/1.65m^2    ALBUMIN 3.9 3.5 - 5.7 g/dL    CALCIUM 8.7 8.6 - 89.6 mg/dL    GLUCOSE 882 (H) 74 - 109 mg/dL    ALKALINE PHOSPHATASE 71 34 - 104 U/L    ALT (SGPT) 17 7 - 52 U/L    AST (SGOT) 15 13 - 39 U/L    BILIRUBIN TOTAL 0.3 0.3 - 1.0 mg/dL    PROTEIN TOTAL 7.3 6.4 - 8.9 g/dL    ALBUMIN/GLOBULIN RATIO 1.1 0.8 - 1.4    OSMOLALITY, CALCULATED 276 270 - 290 mOsm/kg    CALCIUM, CORRECTED 8.8 (L) 8.9 - 10.8 mg/dL    GLOBULIN 3.4 2.0 - 3.5   PT/INR   Result Value Ref Range    PROTHROMBIN TIME 11.9 9.8 - 12.7 seconds    INR 1.05 0.84 - 1.10   MAGNESIUM    Result Value Ref Range    MAGNESIUM  1.8 (L) 1.9 - 2.7 mg/dL   CBC WITH DIFF   Result Value Ref Range    WBC 7.3 3.8 - 11.8 x10^3/uL    RBC 4.74 3.63 - 4.92 x10^6/uL    HGB 13.6 10.9 - 14.3 g/dL    HCT 58.9 68.7 - 58.0 %    MCV 86.3 75.5 - 95.3 fL    MCH 28.7 24.7 - 32.8 pg    MCHC 33.3 32.3 - 35.6 g/dL    RDW 86.3 87.6 - 82.2 %    PLATELETS 266 140 - 440 x10^3/uL    MPV 8.8 7.9 - 10.8 fL    NEUTROPHIL % 63 43 - 77 %    LYMPHOCYTE % 27 16 - 46 %    MONOCYTE % 8 4 - 11 %    EOSINOPHIL % 1 1 - 7 %    BASOPHIL % 1 0 - 1 %    NEUTROPHIL # 4.60 1.90 - 8.20 x10^3/uL    LYMPHOCYTE # 1.90 1.10 - 3.10 x10^3/uL    MONOCYTE # 0.60 0.20 - 0.90 x10^3/uL    EOSINOPHIL # 0.10 0.00 - 0.50 x10^3/uL    BASOPHIL #  0.10 0.00 - 0.10 x10^3/uL     MRI BRAIN AND IAC W/WO CONT   Final Result   1. THE 2 CM RIGHT VESTIBULAR SCHWANNOMA HAS NOT CHANGED SIGNIFICANTLY IN SIZE. INTERNAL ENHANCEMENT HAS DECREASED, LIKELY RELATED TO TREATMENT.   2. NO NEW OR ACUTE FINDINGS.                  Radiologist location  ID: [id number]                  [1]   Allergies  Allergen Reactions    Bee Venom Protein (Honey Bee) Anaphylaxis    Hymenoptera Allergenic Extract      Other reaction(s): UNKNOWN    Penicillins Hives/ Urticaria and Rash     Other reaction(s): Rash, Skin Irritation  Rash and swellling      Sulfa (Sulfonamides) Rash and Hives/ Urticaria     Other Reaction(s): Rash-Raised    Rash and swelling    Amoxicillin Rash

## 2024-06-23 NOTE — ED APP Handoff Note (Signed)
 [facility] - Emergency Department  Emergency Department  Provider in Triage Note    Name: Caitlyn Gomez  Age: 48 y.o.  Gender: female     Subjective:   Caitlyn Gomez is a 48 y.o. female who presents with complaint of Neurologic Problem  .  Pt presents with complaints of pressure in the right side of the ear and twitching of the right side of the face that is been progressively getting worse for the last 3 weeks.  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  She follows with a neurologist at [facility].  They advised her to come to the ER today because there is good no improvement.    Objective:   Filed Vitals:    06/23/24 1456   BP: (!) 144/98   Pulse: 96   Resp: 18   Temp: 36.3 C (97.3 F)   SpO2: 98%      Focused Physical Exam shows adult female alert upright in no apparent distress.  Bilateral TMs clear.  There was no facial droop or facial muscle weakness.  Tongue is midline.  There does appear to be some twitching of the right lower jaw down into the right neck muscles.  This is intermittent.    Assessment:  A medical screening exam was completed.  This patient is a 48 y.o. female with initial findings showing right facial twitching right sided ear pressure    Plan:  Please see initial orders and work-up below.  This is to be continued with full evaluation in the main Emergency Department.     No current facility-administered medications for this encounter.     Results for orders placed or performed during the hospital encounter of 06/23/24 (from the past 24 hours)   CBC/DIFF    Collection Time: 06/23/24  3:00 PM    Narrative    The following orders were created for panel order CBC/DIFF.  Procedure                               Abnormality         Status                     ---------                               -----------         ------                     CBC WITH IPQQ[[id number]]                                                                  Please view results for these tests on the individual orders.        Jon Eke, PA-C  06/23/2024, 14:52

## 2024-06-23 NOTE — ED Nurses Note (Signed)
 Pt A&Ox3 at time of discharge.   Pt stated understanding of all discharge instructions and follow up information. No other needs or concerns expressed at this time.   Pt ambulated to ED lobby with no difficulty. Instructed pt to return o ED if symptoms worsen.

## 2024-06-23 NOTE — ED Triage Notes (Addendum)
 Pt reports that she had gamma knife radiation for a tumor in her ear. Also reports that she has 3 tumors in her brain as well. Pt states that she is having pain and swelling in her ear and also having right sided drooping on the right side of her face. No current droop noted in face. Face appears to have a cramp in the right side of her face going down into her neck as well. Reports symptoms started when radiation stared 3 months prior and has worsened as radiation has continued.

## 2024-06-23 NOTE — ED Nurses Note (Signed)
 Pt back in ED 14 at this time.   MRI made aware for testing.

## 2024-06-23 NOTE — Discharge Instructions (Signed)
 Use prescription, follow up with Dr. Cloyd soon.

## 2024-07-03 ENCOUNTER — Ambulatory Visit: Payer: Self-pay

## 2024-07-07 ENCOUNTER — Ambulatory Visit

## 2024-07-07 DIAGNOSIS — D333 Benign neoplasm of cranial nerves: Secondary | ICD-10-CM

## 2024-07-07 NOTE — Progress Notes (Signed)
 [facility] [facility] Radiation Oncology  [facility]  MyChart Virtual Visit      Name: Traci Gafford  MRN: [id number]  DOB: 1976/06/23    Date of Service:  07/07/2024    TELEMEDICINE DOCUMENTATION:  Patient Location:  MyChart video visit from home address: [address]  [place] [address]   Patient/family aware of provider location:  yes  Patient/family consent for telemedicine:  yes  Examination observed and performed by: Thersia Rao, PA-C      Last office visit in this department: Visit date not found       Reason for call: Follow-up on treatment toxicities  Call notes:    Diagnosis:   R sided vestibular schwannoma    Radiation History:   GKRS, delivered 02/23/24    Time since last treatment:  3 months     History of Present Illness     Caitlyn Gomez is a 48 y.o. female with a PMH of R sided vestibular schwannoma originally diagnosed in 07/2021 after presenting with tinnitus and hearing loss. Associated with her symptoms was R sided facial numbness and weakness. S/p GKRS delivered 02/2024. A video visit was completed today to follow-up on progressive unilateral facial twitching and pain.     At today's visit patient reports she was evaluated in the ED for her progressive symptoms and had a MRI completed. She was prescribed hydrocodone  for pain control without great relief in her symptoms. She notes the pain she is experiencing is intermittent and sharp along the V2 distribution and will cause her eye to tear. She has persistent facial twitching. She notes increased fatigue levels as well.     Physical Exam  Vitals reviewed.   Constitutional:       Appearance: Normal appearance.      Comments: KPS score 90.   HENT:      Head: Normocephalic.   Pulmonary:      Effort: Pulmonary effort is normal.   Skin:     General: Skin is warm and dry.   Neurological:      General: No focal deficit present.      Mental Status: She is alert and oriented to person, place, and time.      Comments: Intermittent  R facial pulling and twitching observed   Psychiatric:         Mood and Affect: Mood normal.         Behavior: Behavior normal.           Assessment/Plan:    Mikka Kissner is a pleasant 48 y.o. femalewith a PMH of R sided vestibular schwannoma s/p GKRS delivered 02/2024.     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 Patient did have facial nerve symptoms prior to treatment.   - Refer to neurology  - Continue Lyrica   - Keep multidisciplinary f/u with NSGY in 08/2023    Total provider time spent with the patient: 13 minutes.    Thersia Rao, PA-C

## 2024-07-19 ENCOUNTER — Encounter (HOSPITAL_COMMUNITY): Payer: Self-pay

## 2024-07-24 ENCOUNTER — Encounter (HOSPITAL_COMMUNITY): Payer: Self-pay

## 2024-07-24 MED ORDER — PREGABALIN 150 MG CAPSULE: 150.0000 mg | ORAL_CAPSULE | Freq: Three times a day (TID) | ORAL | 0 refills | Status: AC

## 2024-07-24 NOTE — Telephone Encounter (Signed)
 I have personally reviewed the patient's controlled substance prescription history from the PDMP on 07/24/2024 and my orders/prescription reflect my decisions based on the patient's condition, reason for care, my evaluation, and PDMP data.

## 2024-07-26 ENCOUNTER — Ambulatory Visit (INDEPENDENT_AMBULATORY_CARE_PROVIDER_SITE_OTHER): Payer: Self-pay

## 2024-07-26 ENCOUNTER — Other Ambulatory Visit (HOSPITAL_COMMUNITY): Payer: Self-pay

## 2024-07-26 DIAGNOSIS — R4189 Other symptoms and signs involving cognitive functions and awareness: Secondary | ICD-10-CM

## 2024-07-26 NOTE — Addendum Note (Signed)
 Addended by: RENNIE ROUSE on: 07/26/2024 03:53 PM     Modules accepted: Orders

## 2024-07-26 NOTE — Telephone Encounter (Signed)
 Referring physician confirmed, pt. Being referred for management of trigeminal neuralgia, and not Cognitive symptoms. Referral sent for scheduling with [facility].

## 2024-07-26 NOTE — Telephone Encounter (Signed)
 RN received a referral for Neurology.  RN requesting referring [facility] to clarify which specialty they are requesting The order itself states cognitive decline, but the visits notes are stating referral to neurology for trigeminal neuralgia.  Message sent to referring provider.

## 2024-07-27 ENCOUNTER — Encounter (INDEPENDENT_AMBULATORY_CARE_PROVIDER_SITE_OTHER): Payer: Self-pay

## 2024-08-10 ENCOUNTER — Encounter (INDEPENDENT_AMBULATORY_CARE_PROVIDER_SITE_OTHER): Payer: Self-pay

## 2024-08-21 ENCOUNTER — Other Ambulatory Visit: Payer: Self-pay

## 2024-08-22 ENCOUNTER — Encounter (INDEPENDENT_AMBULATORY_CARE_PROVIDER_SITE_OTHER): Payer: Self-pay

## 2024-08-22 ENCOUNTER — Ambulatory Visit (INDEPENDENT_AMBULATORY_CARE_PROVIDER_SITE_OTHER)

## 2024-08-22 ENCOUNTER — Ambulatory Visit
Admission: RE | Admit: 2024-08-22 | Discharge: 2024-08-22 | Disposition: A | Discharge status: Still In Hospital | Source: Ambulatory Visit

## 2024-08-22 ENCOUNTER — Ambulatory Visit (INDEPENDENT_AMBULATORY_CARE_PROVIDER_SITE_OTHER): Admission: RE | Admit: 2024-08-22 | Discharge: 2024-08-22 | Payer: Self-pay

## 2024-08-22 ENCOUNTER — Encounter (INDEPENDENT_AMBULATORY_CARE_PROVIDER_SITE_OTHER): Payer: Self-pay | Admitting: Anesthesiology

## 2024-08-22 ENCOUNTER — Ambulatory Visit: Payer: Self-pay | Admitting: Anesthesiology

## 2024-08-22 ENCOUNTER — Ambulatory Visit (HOSPITAL_COMMUNITY)
Admission: RE | Admit: 2024-08-22 | Discharge: 2024-08-22 | Disposition: A | Discharge status: Still In Hospital | Source: Ambulatory Visit

## 2024-08-22 VITALS — BP 124/75 | HR 79 | Temp 97.7°F | Wt 323.0 lb

## 2024-08-22 VITALS — BP 120/54 | HR 90 | Ht 66.0 in | Wt 322.5 lb

## 2024-08-22 VITALS — BP 130/70 | HR 82 | Temp 96.6°F | Ht 66.0 in | Wt 323.2 lb

## 2024-08-22 DIAGNOSIS — R4189 Other symptoms and signs involving cognitive functions and awareness: Secondary | ICD-10-CM

## 2024-08-22 DIAGNOSIS — D333 Benign neoplasm of cranial nerves: Secondary | ICD-10-CM

## 2024-08-22 DIAGNOSIS — R531 Weakness: Secondary | ICD-10-CM

## 2024-08-22 DIAGNOSIS — G500 Trigeminal neuralgia: Secondary | ICD-10-CM

## 2024-08-22 DIAGNOSIS — R42 Dizziness and giddiness: Secondary | ICD-10-CM

## 2024-08-22 DIAGNOSIS — G5139 Clonic hemifacial spasm, unspecified: Secondary | ICD-10-CM

## 2024-08-22 MED ORDER — GADOBUTROL 15 MMOL/15 ML (1 MMOL/ML) INTRAVENOUS SOLUTION
14.0000 mL | INTRAVENOUS | Status: AC
  Administered 2024-08-22: 14 mL via INTRAVENOUS

## 2024-08-22 MED ORDER — DEXAMETHASONE 2 MG TABLET: 2.0000 mg | ORAL_TABLET | Freq: Two times a day (BID) | ORAL | 0 refills | Status: AC

## 2024-08-22 MED ORDER — OXCARBAZEPINE 150 MG TABLET: 150.0000 mg | ORAL_TABLET | Freq: Two times a day (BID) | ORAL | 1 refills | Status: AC

## 2024-08-22 NOTE — Progress Notes (Signed)
 I personally saw and evaluated the patient. See mid-level's note for additional details. My findings/participation are : Pt states that a few weeks after GKR she started having facial symptoms along with the pre-existing dizziness and ringing in the ear. She describes symptoms suggestive of irritation of motor root of TN. These episodes happen about 5-6 times/week. She describes a 'pulling' of her jaw and drooling. Has had a medrol  dose pack without relief last year. Will try a course of dexamethasone  for three weeks. If no improvement will consider surgery.    Ripley Shark, MD

## 2024-08-23 ENCOUNTER — Ambulatory Visit (INDEPENDENT_AMBULATORY_CARE_PROVIDER_SITE_OTHER): Payer: Self-pay

## 2024-08-23 DIAGNOSIS — D333 Benign neoplasm of cranial nerves: Secondary | ICD-10-CM

## 2024-08-24 ENCOUNTER — Other Ambulatory Visit: Payer: Self-pay

## 2024-08-24 ENCOUNTER — Other Ambulatory Visit (HOSPITAL_COMMUNITY): Payer: Self-pay

## 2024-08-24 DIAGNOSIS — D333 Benign neoplasm of cranial nerves: Secondary | ICD-10-CM | POA: Insufficient documentation

## 2024-09-13 ENCOUNTER — Ambulatory Visit (HOSPITAL_COMMUNITY): Payer: Self-pay

## 2024-11-23 ENCOUNTER — Ambulatory Visit (INDEPENDENT_AMBULATORY_CARE_PROVIDER_SITE_OTHER): Payer: Self-pay | Admitting: NEUROLOGY
